# Patient Record
Sex: Female | Born: 1960
Health system: Southern US, Community
[De-identification: ages and names within clinical notes are randomized; demographics above are authoritative.]

## PROBLEM LIST (undated history)

## (undated) DIAGNOSIS — F32A Depression, unspecified: Secondary | ICD-10-CM

## (undated) DIAGNOSIS — I10 Essential (primary) hypertension: Secondary | ICD-10-CM

## (undated) DIAGNOSIS — C519 Malignant neoplasm of vulva, unspecified: Secondary | ICD-10-CM

## (undated) DIAGNOSIS — G4733 Obstructive sleep apnea (adult) (pediatric): Secondary | ICD-10-CM

## (undated) DIAGNOSIS — E119 Type 2 diabetes mellitus without complications: Secondary | ICD-10-CM

## (undated) DIAGNOSIS — J45909 Unspecified asthma, uncomplicated: Secondary | ICD-10-CM

## (undated) DIAGNOSIS — K219 Gastro-esophageal reflux disease without esophagitis: Secondary | ICD-10-CM

## (undated) DIAGNOSIS — J449 Chronic obstructive pulmonary disease, unspecified: Secondary | ICD-10-CM

## (undated) DIAGNOSIS — F419 Anxiety disorder, unspecified: Secondary | ICD-10-CM

## (undated) DIAGNOSIS — F329 Major depressive disorder, single episode, unspecified: Secondary | ICD-10-CM

## (undated) HISTORY — PX: BREAST CYST EXCISION: SHX579

## (undated) HISTORY — DX: Obstructive sleep apnea (adult) (pediatric): G47.33

## (undated) HISTORY — DX: Type 2 diabetes mellitus without complications: E11.9

## (undated) HISTORY — DX: Malignant neoplasm of vulva, unspecified: C51.9

## (undated) HISTORY — PX: CERVICAL FUSION: SHX112

## (undated) HISTORY — DX: Essential (primary) hypertension: I10

## (undated) HISTORY — PX: ABDOMINAL HYSTERECTOMY: SHX81

## (undated) HISTORY — PX: SHOULDER SURGERY: SHX246

---

## 2001-08-20 DIAGNOSIS — C519 Malignant neoplasm of vulva, unspecified: Secondary | ICD-10-CM

## 2001-08-20 HISTORY — DX: Malignant neoplasm of vulva, unspecified: C51.9

## 2001-08-20 HISTORY — PX: CO2 LASER APPLICATION: SHX5778

## 2001-11-26 ENCOUNTER — Ambulatory Visit: Admission: RE | Admit: 2001-11-26 | Discharge: 2001-11-26 | Payer: Self-pay | Admitting: Gynecology

## 2001-12-10 ENCOUNTER — Ambulatory Visit (HOSPITAL_BASED_OUTPATIENT_CLINIC_OR_DEPARTMENT_OTHER): Admission: RE | Admit: 2001-12-10 | Discharge: 2001-12-10 | Payer: Self-pay | Admitting: Gynecology

## 2001-12-30 ENCOUNTER — Ambulatory Visit: Admission: RE | Admit: 2001-12-30 | Discharge: 2001-12-30 | Payer: Self-pay | Admitting: Gynecology

## 2002-01-20 ENCOUNTER — Ambulatory Visit: Admission: RE | Admit: 2002-01-20 | Discharge: 2002-01-20 | Payer: Self-pay | Admitting: Gynecology

## 2002-03-25 ENCOUNTER — Ambulatory Visit: Admission: RE | Admit: 2002-03-25 | Discharge: 2002-03-25 | Payer: Self-pay | Admitting: Gynecology

## 2002-03-25 ENCOUNTER — Other Ambulatory Visit: Admission: RE | Admit: 2002-03-25 | Discharge: 2002-03-25 | Payer: Self-pay | Admitting: Gynecology

## 2003-07-23 ENCOUNTER — Ambulatory Visit (HOSPITAL_COMMUNITY): Admission: RE | Admit: 2003-07-23 | Discharge: 2003-07-23 | Payer: Self-pay | Admitting: Family Medicine

## 2004-04-21 ENCOUNTER — Ambulatory Visit (HOSPITAL_COMMUNITY): Admission: RE | Admit: 2004-04-21 | Discharge: 2004-04-21 | Payer: Self-pay | Admitting: Family Medicine

## 2004-07-20 ENCOUNTER — Ambulatory Visit (HOSPITAL_COMMUNITY): Admission: RE | Admit: 2004-07-20 | Discharge: 2004-07-20 | Payer: Self-pay | Admitting: Family Medicine

## 2004-11-15 ENCOUNTER — Ambulatory Visit (HOSPITAL_COMMUNITY): Admission: RE | Admit: 2004-11-15 | Discharge: 2004-11-15 | Payer: Self-pay | Admitting: Family Medicine

## 2005-04-13 ENCOUNTER — Encounter (INDEPENDENT_AMBULATORY_CARE_PROVIDER_SITE_OTHER): Payer: Self-pay | Admitting: General Surgery

## 2005-04-13 ENCOUNTER — Ambulatory Visit (HOSPITAL_COMMUNITY): Admission: RE | Admit: 2005-04-13 | Discharge: 2005-04-13 | Payer: Self-pay | Admitting: General Surgery

## 2006-05-23 ENCOUNTER — Ambulatory Visit (HOSPITAL_COMMUNITY): Admission: RE | Admit: 2006-05-23 | Discharge: 2006-05-23 | Payer: Self-pay | Admitting: Family Medicine

## 2007-09-01 ENCOUNTER — Ambulatory Visit (HOSPITAL_COMMUNITY): Admission: RE | Admit: 2007-09-01 | Discharge: 2007-09-01 | Payer: Self-pay | Admitting: Family Medicine

## 2009-01-24 ENCOUNTER — Ambulatory Visit (HOSPITAL_COMMUNITY): Admission: RE | Admit: 2009-01-24 | Discharge: 2009-01-24 | Payer: Self-pay | Admitting: Family Medicine

## 2010-04-19 ENCOUNTER — Ambulatory Visit (HOSPITAL_COMMUNITY)
Admission: RE | Admit: 2010-04-19 | Discharge: 2010-04-19 | Payer: Self-pay | Source: Home / Self Care | Admitting: Family Medicine

## 2010-09-10 ENCOUNTER — Encounter: Payer: Self-pay | Admitting: Family Medicine

## 2011-01-05 NOTE — Op Note (Signed)
[facility]  Patient:    Amy Cobb, Amy Cobb [facility] Visit Number: [id number] MRN: [id number]          Service Type: NES Location: NESC Attending Physician:  Jeannette Corpus Dictated by:   Rande Brunt. Clarke-Pearson, M.D. Proc. Date: 12/10/01 Admit Date:  12/10/2001   CC:         Telford Nab, R.N.  Ernestina Penna, M.D., [facility],  [address] [place], [place]. 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  PROCEDURE:  CO2 laser vaporization of the vulvar lesion.  SURGEON:  Daniel L. Clarke-Pearson, M.D.  ANESTHESIA:  General LMA.  ESTIMATED BLOOD LOSS:  Nil.  SURGICAL FINDINGS:  After application of the acetic acid to the vulva and anus, a focal lesion measuring approximately 4 x 4 cm was identified overlying the medial aspect of the right labia minora and medial aspect of the labia majora. There were no other lesions noted.  DESCRIPTION OF PROCEDURE:  The patient was brought to the operating room and after satisfactory attainment of general anesthesia was placed in the modified lithotomy position in [place] stirrups. The vulva was cleansed and then acetic acid applied for five minutes in order to identify the entire lesion. Using the CO2 laser and wattage powers of 15 to 20 watts, the lesion was circumscribed with approximately a 1 cm margin. The remainder of the intraepithelial neoplastic skin was oblated to the second surgical plane. Minimal bleeding was noted and controlled with a defocused laser beam. The char was wiped away using acetic acid and additional areas lasered as needed to achieve a complete oblation of the lesion down to the second surgical plane.  Silvadene cream was applied. The patient was awakened from anesthesia and taken to the recovery room in satisfactory condition. Sponge, needle  and instrument counts were correct x2. Dictated by:   Rande Brunt. Clarke-Pearson, M.D. Attending Physician:  Jeannette Corpus DD:  12/10/01 TD:  12/10/01 Job: [id number]

## 2011-01-05 NOTE — Op Note (Signed)
NAME:  Amy Cobb, Amy Cobb              ACCOUNT NO.:  [id number]   MEDICAL RECORD NO.:  [id number]          PATIENT TYPE:  AMB   LOCATION:  DAY                           FACILITY:  [facility]   PHYSICIAN:  Jerolyn Shin C. Katrinka Blazing, M.D.   DATE OF BIRTH:  [birth year]   DATE OF PROCEDURE:  04/13/2005  DATE OF DISCHARGE:                                 OPERATIVE REPORT   HISTORY:  A 50 year old female with a growing, painful, soft tissue mass of  the mid posterior neck with bilateral posterior lymphadenopathy.   PREOPERATIVE DIAGNOSIS:  Soft tissue mass of neck.   POSTOPERATIVE DIAGNOSIS:  Soft tissue mass of neck, 3.5 cm.   PROCEDURE:  Wide excision of soft tissue mass of neck, 3.5 cm.   SURGEON:  Dr. Katrinka Blazing.   DESCRIPTION:  Under general anesthesia, the patient was positioned in the  modified left lateral decubitus position. A transverse elliptical incision  was made over the mass. Incision was extended down to the fascia of the  posterior cervical muscles. It was then excised off the fascia. It was  completely excised. The deep tissue showed evidence some inflammation.  Hemostasis was achieved. The deep tissues were closed with interrupted 2-0  Monocryl. Subcutaneous tissue closed with running 2-0 Monocryl. Skin closed  with subcuticular 4-0 Vicryl. A dressing was placed. She was awakened from  anesthesia uneventfully, transferred to a bed and taken to the  postanesthetic care unit for further monitoring.      Dirk Dress. Katrinka Blazing, M.D.  Electronically Signed     LCS/MEDQ  D:  04/13/2005  T:  04/13/2005  Job:  [id number]

## 2011-01-05 NOTE — Consult Note (Signed)
   NAME:  Amy Cobb, SPELLS                        ACCOUNT NO.:  [id number]   MEDICAL RECORD NO.:  [id number]                   PATIENT TYPE:  OUT   LOCATION:  GYN                                  FACILITY:  [facility]   PHYSICIAN:  Rande Brunt. Clarke-Pearson, M.D.      DATE OF BIRTH:  [birth year]   DATE OF CONSULTATION:  DATE OF DISCHARGE:  03/25/2002                                 GYN CONSULTATION   HISTORY OF PRESENT ILLNESS:  This 50 year old African-American female  returns for follow-up, having had laser vaporization of the vulva for  carcinoma in situ on December 10, 2001.  She had an uncomplicated postoperative  course.  She reports she has had no symptoms referable to the vulva.   PAST MEDICAL HISTORY:  Status post hysterectomy.   SOCIAL HISTORY:  The patient is a guard at the prison.   REVIEW OF SYSTEMS:  Essentially negative.   PHYSICAL EXAMINATION:  VITAL SIGNS:  Weight 170 pounds.  Blood pressure  160/90.  GENERAL:  Healthy black female, in no acute distress.  HEENT:  Negative.  NECK:  Supple.  Without thyromegaly.  There is no supraclavicular or  inguinal adenopathy.  ABDOMEN:  Soft and nontender.  No mass, organomegaly, ascites, or hernias  noted.  PELVIC:  EGBUS shows the vulvar lasered area is hypopigmented.  There are no  lesions noted on the vulva or anus.  The vagina is clean, well supported.  The cervix and uterus are surgically absent.  Adnexa without masses.   IMPRESSION:  Status post laser vaporization of the vulva for carcinoma in  situ.  No evidence of recurrent disease.  The patient has had good healing.   PLAN:  Pap smears were obtained today.  We will have the patient return to  the care of her primary gynecologist, Dr. Ernestina Penna, in [place] and would  recommend that she be evaluated every six months.                                                 Daniel L. Stanford Breed, M.D.    DLC/MEDQ  D:  03/25/2002  T:  03/28/2002  Job:  [id number]   cc:   Telford Nab, R.N.   Ernestina Penna, M.D.  [facility]  [address]  [place], [place] [address]

## 2011-01-05 NOTE — H&P (Signed)
NAME:  Amy Cobb, Amy Cobb              ACCOUNT NO.:  [id number]   MEDICAL RECORD NO.:  [id number]          PATIENT TYPE:  AMB   LOCATION:  DAY                           FACILITY:  [facility]   PHYSICIAN:  Jerolyn Shin C. Katrinka Blazing, M.D.   DATE OF BIRTH:  17-Nov-1960   DATE OF ADMISSION:  DATE OF DISCHARGE:  LH                                HISTORY & PHYSICAL   A 50 year old female with a history of a mass at the base of her scalp for  at least a year.  The mass has increased in size.  She has had bilateral  posterior cervical adenopathy.  She is scheduled to have the mass excised  under anesthesia.   PAST MEDICAL HISTORY:  Positive for hypertension and asthma.   PAST SURGICAL HISTORY:  Right breast biopsy and hysterectomy.   MEDICATIONS:  1.  Albuterol inhaler twice daily.  2.  Singulair 10 mg daily.  3.  Diltiazem extended release 240 mg daily.  4.  Aspirin 81 mg daily.  5.  Metoprolol 100 mg twice daily.  6.  Diovan/HCT 160/12.5 mg daily.   ALLERGIES:  AUGMENTIN.   SOCIAL HISTORY:  She is single.  She is employed as a Press photographer.  She drinks an occasional alcoholic beverage.  She smokes about six  cigarettes per day.   PHYSICAL EXAMINATION:  VITAL SIGNS:  Blood pressure 106/96, pulse 72,  respirations 20.  Weight 163 pounds.  HEENT:  Unremarkable.  NECK:  There is a 3 cm mass in the mid portion of the posterior neck at the  lower hairline in the midline.  There are small, nontender, mobile bilateral  posterior cervical nodes.  There are no anterior cervical nodes.  Neck is  otherwise unremarkable.  LUNGS:  Clear to auscultation.  HEART:  Regular rate and rhythm without murmur, gallop or rub.  ABDOMEN:  Soft and nontender with no masses.  EXTREMITIES:  No clubbing, cyanosis or edema.  NEUROLOGIC:  No focal motor, sensory, or cerebellar deficits.   IMPRESSION:  1.  Soft tissue mass, posterior neck, 3 to 3.5 cm.  2.  Hypertension.  3.  History of asthma.   PLAN:  Excision  of mass under anesthesia.      Dirk Dress. Katrinka Blazing, M.D.  Electronically Signed     LCS/MEDQ  D:  04/12/2005  T:  04/12/2005  Job:  [id number]   cc:   [facility]  Fax: [phone]   Corrie Mckusick, M.D.  Fax: [phone]

## 2011-01-05 NOTE — Consult Note (Signed)
[facility]  Patient:    Amy Cobb, Amy Cobb [facility] Visit Number: [id number] MRN: [id number]          Service Type: GON Location: GYN Attending Physician:  Jeannette Corpus Dictated by:   Rande Brunt. Clarke-Pearson, M.D. Consultation Date: 01/20/02 Admit Date:  01/20/2002 Discharge Date: 01/20/2002   CC:         Dagmar Hait, R.N. N                          Consultation Report  GYNECOLOGY ONCOLOGY PROGRAM  HISTORY:                      The patient returns today for further follow-up, having had laser vaporization of the vulva for car in situ on December 10, 2001. She has had good healing.  She reports that she is planning on returning to work.  She denies any vaginal bleeding or discharge, or any significant pain.  PHYSICAL EXAMINATION:  VITAL SIGNS:                  Weight 163 pounds.  Blood pressure 158/105.  ABDOMEN:                      Soft, nontender.  No masses, hepatomegaly, hernia noted.  PELVIC:                       EGBUS shows that the lasered area on the right vulva is completely healed.  The labia minora are flattened on this side.  At the upper aspect of the laser area is an area of hyperpigmentation.  It is difficult to determine whether this is normal pigmented skin in an [place] amongst the lasered skin or whether this is a new lesion.  PLAN:                         We will have the patient return to see me in two months, especially to monitor this area of concern.  If it becomes at all suspicious we will consider local excision in the office. Dictated by:   Rande Brunt. Clarke-Pearson, M.D. Attending Physician:  Jeannette Corpus DD:  01/20/02 TD:  01/22/02 Job: [id number]

## 2011-01-05 NOTE — Consult Note (Signed)
[facility]  Patient:    Amy Cobb, Amy Cobb [facility] Visit Number: [id number] MRN: [id number]          Service Type: GON Location: GYN Attending Physician:  Jeannette Corpus Dictated by:   Rande Brunt. Clarke-Pearson, M.D. Proc. Date: 11/26/01 Admit Date:  11/26/2001   CC:         Dr. Ernestina Penna, [facility] [address] [place], [place] [address]             Telford Nab, R.N.   Consultation Report  REASON FOR CONSULTATION:  A 50 year old African-American female seen in consultation at the request of Dr. Ernestina Penna of [place].  The patient noted several months of perineal itching and thickening and was subsequently evaluated by Dr. Ralph Dowdy, who obtained a vulvar biopsy showing squamous cell carcinoma in situ.  The patient reports that she has had no past history of abnormal Pap smears, HPV infections, warts.  She is not sexually active at the present time.  The vulvar lesion is only somewhat pruritic, and there is no bleeding or drainage from this area.  PAST MEDICAL HISTORY:  Medical illnesses:  Asthma.  PAST SURGICAL HISTORY:  Total abdominal hysterectomy for fibroids, breast biopsy.  DRUG ALLERGIES:  AUGMENTIN.  OBSTETRICAL HISTORY:  Gravida 0.  SOCIAL HISTORY:  The patient is a Public relations account executive at the [employer] but is moved down here from [place] approximately four years ago.  REVIEW OF SYSTEMS:  Otherwise negative.  PHYSICAL EXAMINATION:  VITAL SIGNS:  Weight 166 pounds, height 5 feet 6 inches, blood pressure 140/90, pulse 80, respiratory rate 18.  GENERAL:  The patient is a healthy African-American female in no acute distress.  HEENT:  Negative.  NECK:  Supple without thyromegaly.  LYMPHATIC:  There is no supraclavicular or inguinal adenopathy.  ABDOMEN:  Soft, nontender, no mass, organomegaly, ascites, or hernias noted. Her scar is well-healed.  PELVIC:  EG/BUS shows quite obviously a vulvar lesion on the right  vulva measuring approximately 4 cm in diameter.  This is thickened with white epithelium and some erythema in the center portion.  Acetic acid is applied to the entire vulva for five minutes, and the vulva and anus are reinspected.  I did not see any other lesions.  The vagina is narrow and stenotic and does not admit a narrow speculum.  The patient is very uncomfortable, and therefore the remainder of the pelvic exam is deferred.  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  I had a lengthy discussion with the patient and her companion regarding management options, which would include primary excision with primary closure, excision with split-thickness skin graft, or laser vaporization.  We discussed the pros and cons of each of these.  At the conclusion of that discussion, the patient wishes to proceed with a CO2 laser vaporization of the vulva.  She is aware of the risks involved and the need for sitz baths, Silvadene cream postop.  We will schedule surgery for the near future. Dictated by:   Rande Brunt. Clarke-Pearson, M.D. Attending Physician:  Jeannette Corpus DD:  11/26/01 TD:  11/27/01 Job: [id number]

## 2011-01-05 NOTE — Consult Note (Signed)
[facility]  Patient:    Amy Cobb, Amy Cobb [facility] Visit Number: [id number] MRN: [id number]          Service Type: GON Location: GYN Attending Physician:  Jeannette Corpus Dictated by:   Rande Brunt. Clarke-Pearson, M.D. Admit Date:  12/30/2001 Discharge Date: 12/30/2001   CC:         Telford Nab, R.N.   Consultation Report  HISTORY OF PRESENT ILLNESS:  This is a 50 year old African-American female who returns for her postoperative check now 3 weeks following C02 laser vaporization of a right vulvar lesion. She tolerated the procedure well. She is using sitz baths and Silvadene Cream and seems to be doing reasonably well, although she does have some burning on the ulcerated area when she urinates. She has no other constitutional symptoms.  PHYSICAL EXAMINATION:  VITAL SIGNS:  Blood pressure 164/109. Weight 155 pounds.  GENERAL:  The patient is a pleasant, healthy, black female in no acute distress.  HEENT:  Negative.  NECK:  Supple without thyromegaly.  ABDOMEN:  Soft, nontender.  No masses, splenomegaly, ascites or hernias noted.  PELVIC:  The right vulvar lesion has been totally ablated and is now granulated nicely with some early epithelialization at the periphery. There is no evidence of infection, although it is tender to the touch.  IMPRESSION:  The patient is doing well. She will continue on her regimen and return to see me in 3 weeks for reevaluation. Dictated by:   Rande Brunt. Clarke-Pearson, M.D. Attending Physician:  Jeannette Corpus DD:  12/30/01 TD:  01/02/02 Job: [id number]

## 2012-05-07 DIAGNOSIS — R0602 Shortness of breath: Secondary | ICD-10-CM

## 2012-10-08 ENCOUNTER — Ambulatory Visit (HOSPITAL_COMMUNITY)
Admission: RE | Admit: 2012-10-08 | Discharge: 2012-10-08 | Disposition: A | Discharge status: Home / Self Care | Payer: Self-pay | Source: Ambulatory Visit | Attending: Family Medicine | Admitting: Family Medicine

## 2012-10-08 ENCOUNTER — Other Ambulatory Visit (HOSPITAL_COMMUNITY): Payer: Self-pay | Admitting: Family Medicine

## 2012-10-08 DIAGNOSIS — R0602 Shortness of breath: Secondary | ICD-10-CM | POA: Insufficient documentation

## 2012-10-08 DIAGNOSIS — J441 Chronic obstructive pulmonary disease with (acute) exacerbation: Secondary | ICD-10-CM | POA: Insufficient documentation

## 2012-12-17 ENCOUNTER — Emergency Department (HOSPITAL_COMMUNITY)
Admission: EM | Admit: 2012-12-17 | Discharge: 2012-12-17 | Disposition: A | Discharge status: Home / Self Care | Payer: Self-pay | Attending: Emergency Medicine | Admitting: Emergency Medicine

## 2012-12-17 ENCOUNTER — Emergency Department (HOSPITAL_COMMUNITY): Payer: Self-pay

## 2012-12-17 ENCOUNTER — Encounter (HOSPITAL_COMMUNITY): Payer: Self-pay | Admitting: Emergency Medicine

## 2012-12-17 DIAGNOSIS — F329 Major depressive disorder, single episode, unspecified: Secondary | ICD-10-CM | POA: Insufficient documentation

## 2012-12-17 DIAGNOSIS — E119 Type 2 diabetes mellitus without complications: Secondary | ICD-10-CM | POA: Insufficient documentation

## 2012-12-17 DIAGNOSIS — R51 Headache: Secondary | ICD-10-CM | POA: Insufficient documentation

## 2012-12-17 DIAGNOSIS — F3289 Other specified depressive episodes: Secondary | ICD-10-CM | POA: Insufficient documentation

## 2012-12-17 DIAGNOSIS — R42 Dizziness and giddiness: Secondary | ICD-10-CM | POA: Insufficient documentation

## 2012-12-17 DIAGNOSIS — I10 Essential (primary) hypertension: Secondary | ICD-10-CM | POA: Insufficient documentation

## 2012-12-17 DIAGNOSIS — Z87891 Personal history of nicotine dependence: Secondary | ICD-10-CM | POA: Insufficient documentation

## 2012-12-17 DIAGNOSIS — Z79899 Other long term (current) drug therapy: Secondary | ICD-10-CM | POA: Insufficient documentation

## 2012-12-17 DIAGNOSIS — J441 Chronic obstructive pulmonary disease with (acute) exacerbation: Secondary | ICD-10-CM | POA: Insufficient documentation

## 2012-12-17 DIAGNOSIS — J302 Other seasonal allergic rhinitis: Secondary | ICD-10-CM

## 2012-12-17 DIAGNOSIS — Z981 Arthrodesis status: Secondary | ICD-10-CM | POA: Insufficient documentation

## 2012-12-17 DIAGNOSIS — J309 Allergic rhinitis, unspecified: Secondary | ICD-10-CM | POA: Insufficient documentation

## 2012-12-17 DIAGNOSIS — R110 Nausea: Secondary | ICD-10-CM | POA: Insufficient documentation

## 2012-12-17 DIAGNOSIS — Z880 Allergy status to penicillin: Secondary | ICD-10-CM | POA: Insufficient documentation

## 2012-12-17 DIAGNOSIS — J40 Bronchitis, not specified as acute or chronic: Secondary | ICD-10-CM

## 2012-12-17 HISTORY — DX: Depression, unspecified: F32.A

## 2012-12-17 HISTORY — DX: Major depressive disorder, single episode, unspecified: F32.9

## 2012-12-17 HISTORY — DX: Unspecified asthma, uncomplicated: J45.909

## 2012-12-17 HISTORY — DX: Chronic obstructive pulmonary disease, unspecified: J44.9

## 2012-12-17 LAB — TROPONIN I: Troponin I: <0.3 ng/mL (ref <0.30)

## 2012-12-17 LAB — CBC WITH DIFFERENTIAL/PLATELET
Basophils Absolute: 0.1 10*3/uL (ref 0.0–0.1)
Basophils Relative: 1 % (ref 0–1)
Eosinophils Absolute: 0.1 10*3/uL (ref 0.0–0.7)
Eosinophils Relative: 1 % (ref 0–5)
HCT: 42.8 % (ref 36.0–46.0)
Hemoglobin: 15.3 g/dL — ABNORMAL HIGH (ref 12.0–15.0)
Lymphocytes Relative: 30 % (ref 12–46)
Lymphs Abs: 2.2 10*3/uL (ref 0.7–4.0)
MCH: 28 pg (ref 26.0–34.0)
MCHC: 35.7 g/dL (ref 30.0–36.0)
MCV: 78.4 fL (ref 78.0–100.0)
Monocytes Absolute: 0.5 10*3/uL (ref 0.1–1.0)
Monocytes Relative: 6 % (ref 3–12)
Neutro Abs: 4.7 10*3/uL (ref 1.7–7.7)
Neutrophils Relative %: 62 % (ref 43–77)
Platelets: 253 10*3/uL (ref 150–400)
RBC: 5.46 MIL/uL — ABNORMAL HIGH (ref 3.87–5.11)
RDW: 14.5 % (ref 11.5–15.5)
WBC: 7.5 10*3/uL (ref 4.0–10.5)

## 2012-12-17 LAB — URINALYSIS, ROUTINE W REFLEX MICROSCOPIC
Bilirubin Urine: NEGATIVE
Glucose, UA: >1000 mg/dL — AB
Hgb urine dipstick: NEGATIVE
Ketones, ur: NEGATIVE mg/dL
Leukocytes, UA: NEGATIVE
Nitrite: NEGATIVE
Protein, ur: NEGATIVE mg/dL
Specific Gravity, Urine: 1.01 (ref 1.005–1.030)
Urobilinogen, UA: 0.2 mg/dL (ref 0.0–1.0)
pH: 6.5 (ref 5.0–8.0)

## 2012-12-17 LAB — COMPREHENSIVE METABOLIC PANEL
ALT: 14 U/L (ref 0–35)
AST: 14 U/L (ref 0–37)
Albumin: 3.9 g/dL (ref 3.5–5.2)
Alkaline Phosphatase: 85 U/L (ref 39–117)
BUN: 9 mg/dL (ref 6–23)
CO2: 25 mEq/L (ref 19–32)
Calcium: 10 mg/dL (ref 8.4–10.5)
Chloride: 93 mEq/L — ABNORMAL LOW (ref 96–112)
Creatinine, Ser: 0.49 mg/dL — ABNORMAL LOW (ref 0.50–1.10)
GFR calc Af Amer: >90 mL/min (ref >90)
GFR calc non Af Amer: >90 mL/min (ref >90)
Glucose, Bld: 349 mg/dL — ABNORMAL HIGH (ref 70–99)
Potassium: 3.9 mEq/L (ref 3.5–5.1)
Sodium: 130 mEq/L — ABNORMAL LOW (ref 135–145)
Total Bilirubin: 0.4 mg/dL (ref 0.3–1.2)
Total Protein: 8 g/dL (ref 6.0–8.3)

## 2012-12-17 LAB — GLUCOSE, CAPILLARY: Glucose-Capillary: 352 mg/dL — ABNORMAL HIGH (ref 70–99)

## 2012-12-17 MED ORDER — CLONIDINE HCL 0.1 MG PO TABS: ORAL_TABLET | ORAL | Status: DC

## 2012-12-17 MED ORDER — LABETALOL HCL 5 MG/ML IV SOLN
10.0000 mg | Freq: Once | INTRAVENOUS | Status: AC
  Administered 2012-12-17: 10 mg via INTRAVENOUS
  Filled 2012-12-17: qty 4

## 2012-12-17 MED ORDER — ONDANSETRON HCL 4 MG PO TABS: 4.0000 mg | ORAL_TABLET | Freq: Four times a day (QID) | ORAL | Status: DC | PRN

## 2012-12-17 MED ORDER — MECLIZINE HCL 12.5 MG PO TABS
25.0000 mg | ORAL_TABLET | Freq: Once | ORAL | Status: AC
  Administered 2012-12-17: 25 mg via ORAL
  Filled 2012-12-17: qty 2

## 2012-12-17 MED ORDER — MECLIZINE HCL 25 MG PO TABS: ORAL_TABLET | ORAL | Status: DC

## 2012-12-17 MED ORDER — AZITHROMYCIN 250 MG PO TABS: ORAL_TABLET | ORAL | Status: DC

## 2012-12-17 MED ORDER — DIPHENHYDRAMINE HCL 50 MG/ML IJ SOLN
25.0000 mg | Freq: Once | INTRAMUSCULAR | Status: AC
  Administered 2012-12-17: 25 mg via INTRAVENOUS
  Filled 2012-12-17: qty 1

## 2012-12-17 MED ORDER — AEROCHAMBER Z-STAT PLUS/MEDIUM MISC
1.0000 | Freq: Once | Status: DC
  Filled 2012-12-17: qty 1

## 2012-12-17 MED ORDER — ALBUTEROL SULFATE HFA 108 (90 BASE) MCG/ACT IN AERS
2.0000 | INHALATION_SPRAY | RESPIRATORY_TRACT | Status: DC | PRN
  Administered 2012-12-17: 2 via RESPIRATORY_TRACT
  Filled 2012-12-17: qty 6.7

## 2012-12-17 MED ORDER — METOCLOPRAMIDE HCL 5 MG/ML IJ SOLN
10.0000 mg | Freq: Once | INTRAMUSCULAR | Status: AC
  Administered 2012-12-17: 10 mg via INTRAVENOUS
  Filled 2012-12-17: qty 2

## 2012-12-17 MED ORDER — LORAZEPAM 1 MG PO TABS
1.0000 mg | ORAL_TABLET | Freq: Once | ORAL | Status: AC
  Administered 2012-12-17: 1 mg via ORAL
  Filled 2012-12-17: qty 1

## 2012-12-17 MED ORDER — KETOROLAC TROMETHAMINE 30 MG/ML IJ SOLN
30.0000 mg | Freq: Once | INTRAMUSCULAR | Status: AC
  Administered 2012-12-17: 30 mg via INTRAVENOUS
  Filled 2012-12-17: qty 1

## 2012-12-17 NOTE — ED Notes (Signed)
Pt given water 

## 2012-12-17 NOTE — ED Notes (Signed)
Patient with no complaints at this time. Respirations even and unlabored. Skin warm/dry. Discharge instructions reviewed with patient at this time. Patient given opportunity to voice concerns/ask questions. IV removed per policy and band-aid applied to site. Patient discharged at this time and left Emergency Department via wheelchair.  

## 2012-12-17 NOTE — ED Notes (Signed)
Pt gone to MRI 

## 2012-12-17 NOTE — ED Provider Notes (Signed)
History     CSN: [id number]  Arrival date & time 12/17/12  1245   First MD Initiated Contact with Patient 12/17/12 1255      Chief Complaint  Patient presents with  . Fatigue  . Shortness of Breath  . Asthma    (Consider location/radiation/quality/duration/timing/severity/associated sxs/prior treatment) HPI  Patient reports she has been having flareup of her COPD and her allergies past few weeks. She states this morning she bent over to tie her shoes and she had acute feeling of feeling like she was spinning. She states this lasted about 10 minutes and she had nausea without vomiting. She states she had a right-sided headache and still felt disoriented or confused afterwards and states it felt like when her blood sugar is low. She also reported feeling unsteady and off balance. At about 1220 she was driving her vehicle for a few minutes. She states she was looking straight ahead but reaching to turn on her radio and she again had acute onset of feeling like she was spinning and felt like she was going to pass out. She states this time she felt like her heart was racing and she had a pressure in her head. She denies numbness or weakness in her arms, legs, or face. She states she did have an abnormal feeling her right jaw with normal speech earlier this morning. She states she has a chronic cough that is worse. She has some shortness of breath without wheezing. She's had lack of energy for the past few days and also loss of appetite. She states when she uses her nebulizer it only helps temporarily. She states she's never had the dizziness like this before.  PCP Dr Phillips Odor  Past Medical History  Diagnosis Date  . COPD (chronic obstructive pulmonary disease)   . Hypertension   . Asthma   . Diabetes mellitus without complication   . Depression     Past Surgical History  Procedure Laterality Date  . Abdominal hysterectomy    . Shoulder surgery    . Cervical fusion      History  reviewed. No pertinent family history.  History  Substance Use Topics  . Smoking status: Former Games developer  . Smokeless tobacco: Former Neurosurgeon    Quit date: 12/17/2009  . Alcohol Use: Yes     Comment: occ  umemployed, applying for disability.   OB History   Grav Para Term Preterm Abortions TAB SAB Ect Mult Living                  Review of Systems  All other systems reviewed and are negative.    Allergies  Augmentin and Penicillins  Home Medications   Current Outpatient Rx  Name  Route  Sig  Dispense  Refill  . albuterol (PROVENTIL HFA;VENTOLIN HFA) 108 (90 BASE) MCG/ACT inhaler   Inhalation   Inhale 2 puffs into the lungs every 6 (six) hours as needed for wheezing or shortness of breath.         Marland Kitchen albuterol (PROVENTIL) (2.5 MG/3ML) 0.083% nebulizer solution   Nebulization   Take 2.5 mg by nebulization every 6 (six) hours as needed for wheezing or shortness of breath.         . budesonide-formoterol (SYMBICORT) 160-4.5 MCG/ACT inhaler   Inhalation   Inhale 2 puffs into the lungs 2 (two) times daily.         . cloNIDine (CATAPRES) 0.1 MG tablet   Oral   Take 0.1 mg by mouth TID.         Marland Kitchen  diltiazem (TIAZAC) 240 MG 24 hr capsule   Oral   Take 240 mg by mouth daily.         Marland Kitchen FLUoxetine (PROZAC) 20 MG capsule   Oral   Take 20 mg by mouth daily.         . metoprolol (LOPRESSOR) 100 MG tablet   Oral   Take 100 mg by mouth 2 (two) times daily.         . montelukast (SINGULAIR) 10 MG tablet   Oral   Take 10 mg by mouth at bedtime.         . sitaGLIPtan-metformin (JANUMET) 50-1000 MG per tablet   Oral   Take 1 tablet by mouth 2 (two) times daily with a meal.         . tiotropium (SPIRIVA) 18 MCG inhalation capsule   Inhalation   Place 18 mcg into inhaler and inhale daily.         . traZODone (DESYREL) 100 MG tablet   Oral   Take 100 mg by mouth at bedtime.         ]  ED Triage Vitals  Enc Vitals Group     BP 12/17/12 1251 229/108  mmHg     Pulse Rate 12/17/12 1251 90     Resp 12/17/12 1251 20     Temp 12/17/12 1251 97.8 F (36.6 C)     Temp src 12/17/12 1251 Oral     SpO2 12/17/12 1251 100 %     Weight 12/17/12 1251 165 lb (74.844 kg)     Height 12/17/12 1251 5' 7.5" (1.715 m)     Head Cir --      Peak Flow --      Pain Score 12/17/12 1254 Seven     Pain Loc --      Pain Edu? --      Excl. in GC? --      Vital signs normal except for hypertension   Physical Exam  Nursing note and vitals reviewed. Constitutional: She is oriented to person, place, and time. She appears well-developed and well-nourished.  Non-toxic appearance. She does not appear ill. No distress.  HENT:  Head: Normocephalic and atraumatic.  Right Ear: External ear normal.  Left Ear: External ear normal.  Nose: Nose normal. No mucosal edema or rhinorrhea.  Mouth/Throat: Oropharynx is clear and moist and mucous membranes are normal. No dental abscesses or edematous.  Eyes: Conjunctivae and EOM are normal. Pupils are equal, round, and reactive to light.  No nystagymus  Neck: Normal range of motion and full passive range of motion without pain. Neck supple.  Cardiovascular: Normal rate, regular rhythm and normal heart sounds.  Exam reveals no gallop and no friction rub.   No murmur heard. Pulmonary/Chest: Effort normal and breath sounds normal. No respiratory distress. She has no wheezes. She has no rhonchi. She has no rales. She exhibits no tenderness and no crepitus.  Abdominal: Soft. Normal appearance and bowel sounds are normal. She exhibits no distension. There is no tenderness. There is no rebound and no guarding.  Musculoskeletal: Normal range of motion. She exhibits no edema and no tenderness.  Moves all extremities well.   Neurological: She is alert and oriented to person, place, and time. She has normal strength. No cranial nerve deficit.  Skin: Skin is warm, dry and intact. No rash noted. No erythema. No pallor.  Psychiatric: Her  speech is normal and behavior is normal. Her mood appears not anxious.  Flat affect    ED Course  Procedures (including critical care time)  Medications  albuterol (PROVENTIL HFA;VENTOLIN HFA) 108 (90 BASE) MCG/ACT inhaler 2 puff (not administered)  aerochamber Z-Stat Plus/medium 1 each (not administered)  meclizine (ANTIVERT) tablet 25 mg (25 mg Oral Given 12/17/12 1347)  metoCLOPramide (REGLAN) injection 10 mg (10 mg Intravenous Given 12/17/12 1347)  diphenhydrAMINE (BENADRYL) injection 25 mg (25 mg Intravenous Given 12/17/12 1346)  labetalol (NORMODYNE,TRANDATE) injection 10 mg (10 mg Intravenous Given 12/17/12 1541)  meclizine (ANTIVERT) tablet 25 mg (25 mg Oral Given 12/17/12 1539)  LORazepam (ATIVAN) tablet 1 mg (1 mg Oral Given 12/17/12 1539)  ketorolac (TORADOL) 30 MG/ML injection 30 mg (30 mg Intravenous Given 12/17/12 1747)   Recheck at 1500 blood pressures were noted. She was given labetalol. She states she still has some mild dizziness. She was given an additional dose of Antivert. At this point I felt we should do an MR of her brain to make sure she was not having a stroke.  At time of discharge her dizziness is mild, her BP is 169/92 with pulse of 86 after getting labetolol. I would expect her pulse to be lower with the amount of metoprolol she is taking. She states she took it today but not her clonidine because she was going to drive and didn't want to get sleepy. We discuss skipping her clonidine which can make her BP paradoxically get very high. She is going to monitor her BP at home.   Results for orders placed during the hospital encounter of 12/17/12  GLUCOSE, CAPILLARY      Result Value Range   Glucose-Capillary 352 (*) 70 - 99 mg/dL  CBC WITH DIFFERENTIAL      Result Value Range   WBC 7.5  4.0 - 10.5 K/uL   RBC 5.46 (*) 3.87 - 5.11 MIL/uL   Hemoglobin 15.3 (*) 12.0 - 15.0 g/dL   HCT 14.7  82.9 - 56.2 %   MCV 78.4  78.0 - 100.0 fL   MCH 28.0  26.0 - 34.0 pg   MCHC  35.7  30.0 - 36.0 g/dL   RDW 13.0  86.5 - 78.4 %   Platelets 253  150 - 400 K/uL   Neutrophils Relative 62  43 - 77 %   Neutro Abs 4.7  1.7 - 7.7 K/uL   Lymphocytes Relative 30  12 - 46 %   Lymphs Abs 2.2  0.7 - 4.0 K/uL   Monocytes Relative 6  3 - 12 %   Monocytes Absolute 0.5  0.1 - 1.0 K/uL   Eosinophils Relative 1  0 - 5 %   Eosinophils Absolute 0.1  0.0 - 0.7 K/uL   Basophils Relative 1  0 - 1 %   Basophils Absolute 0.1  0.0 - 0.1 K/uL  COMPREHENSIVE METABOLIC PANEL      Result Value Range   Sodium 130 (*) 135 - 145 mEq/L   Potassium 3.9  3.5 - 5.1 mEq/L   Chloride 93 (*) 96 - 112 mEq/L   CO2 25  19 - 32 mEq/L   Glucose, Bld 349 (*) 70 - 99 mg/dL   BUN 9  6 - 23 mg/dL   Creatinine, Ser 6.96 (*) 0.50 - 1.10 mg/dL   Calcium 29.5  8.4 - 28.4 mg/dL   Total Protein 8.0  6.0 - 8.3 g/dL   Albumin 3.9  3.5 - 5.2 g/dL   AST 14  0 - 37 U/L   ALT 14  0 - 35 U/L   Alkaline Phosphatase 85  39 - 117 U/L   Total Bilirubin 0.4  0.3 - 1.2 mg/dL   GFR calc non Af Amer >90  >90 mL/min   GFR calc Af Amer >90  >90 mL/min  TROPONIN I      Result Value Range   Troponin I <0.30  <0.30 ng/mL  URINALYSIS, ROUTINE W REFLEX MICROSCOPIC      Result Value Range   Color, Urine YELLOW  YELLOW   APPearance CLEAR  CLEAR   Specific Gravity, Urine 1.010  1.005 - 1.030   pH 6.5  5.0 - 8.0   Glucose, UA >1000 (*) NEGATIVE mg/dL   Hgb urine dipstick NEGATIVE  NEGATIVE   Bilirubin Urine NEGATIVE  NEGATIVE   Ketones, ur NEGATIVE  NEGATIVE mg/dL   Protein, ur NEGATIVE  NEGATIVE mg/dL   Urobilinogen, UA 0.2  0.0 - 1.0 mg/dL   Nitrite NEGATIVE  NEGATIVE   Leukocytes, UA NEGATIVE  NEGATIVE  URINE MICROSCOPIC-ADD ON      Result Value Range   Squamous Epithelial / LPF RARE  RARE   WBC, UA 0-2  <3 WBC/hpf   Laboratory interpretation all normal except for hyperglycemia, glucosuria, concentrated hemoglobin consistent with dehydration, hyponatremia and low chloride consistent with dehydration   Dg Chest 2  View  12/17/2012  *RADIOLOGY REPORT*  Clinical Data: Shortness of breath, dizziness, fatigue.  History of asthma.  CHEST - 2 VIEW  Comparison: 10/08/2012  Findings: Heart and mediastinal contours are within normal limits. No focal opacities or effusions.  No acute bony abnormality.  IMPRESSION: No active cardiopulmonary disease.   Original Report Authenticated By: Charlett Nose, M.D.    Mr Brain Wo Contrast  12/17/2012  *RADIOLOGY REPORT*  Clinical Data: 52 year old female with headache, dizziness, hypertension, facial pressure.  Reportedly, systolic blood pressure over 200.  MRI HEAD WITHOUT CONTRAST  Technique:  Multiplanar, multiecho pulse sequences of the brain and surrounding structures were obtained according to standard protocol without intravenous contrast.  Comparison: [facility] CT without contrast 05/07/2012.  Findings: Normal cerebral volume. No restricted diffusion to suggest acute infarction.  No midline shift, mass effect, evidence of mass lesion, ventriculomegaly, extra-axial collection or acute intracranial hemorrhage.  Cervicomedullary junction and pituitary are within normal limits.  Major intracranial vascular flow voids are preserved.  Mild for age scattered mostly subcortical small foci of cerebral white matter T2 and FLAIR hyperintensity.  Configuration is nonspecific.  Signal changes are slightly worse in the right hemisphere.  No cortical encephalomalacia.  Otherwise normal gray- white matter signal.  Negative visualized cervical spine.  Normal bone marrow signal.  Mild paranasal sinus mucosal thickening.  Mastoids are clear and visible internal auditory structures are grossly normal. Visualized orbit soft tissues are within normal limits.  Negative scalp soft tissues.  Tiny right frontal convexity scalp lipoma.  IMPRESSION: 1.  No acute intracranial abnormality. 2.  Mild for age nonspecific cerebral white matter signal changes. 3.  Mild paranasal sinus mucosal thickening.    Original Report Authenticated By: Erskine Speed, M.D.     Date: 12/17/2012  Rate: 77  Rhythm: normal sinus rhythm  QRS Axis: normal  Intervals: normal  ST/T Wave abnormalities: normal  Conduction Disutrbances:none  Narrative Interpretation:   Old EKG Reviewed: none available    1. Vertigo   2. Bronchitis   3. Seasonal allergies       MDM patient presents with  hypertension in the 229/122 range. She has dizziness  which at first was not clear whether it was vertigo or from her hypertension. Patient did not take all her for her blood pressure medicine today, specifically her clonidine which can cause a paradoxical hypertension if it is cold turkeyed. Patient did respond to meclizine with some disc mild dizziness still present at time of discharge. Her blood pressure improved with labetalol. We discussed her monitoring her blood pressure closely the next couple days. She was given a when necessary order of clonidine to use to bring it down at home if her blood pressure gets over 190 systolic. Her cough that she's had for the past few weeks was treated with antibiotics. I did not start her on steroids since her baseline glucose was in the mid 300s.           Ward Givens, MD 12/17/12 [phone]

## 2012-12-17 NOTE — ED Notes (Signed)
Pt here for increased weakness and SOB due to COPD. Pt states she was getting dressed this am and about passed out. Pt states then was driving and felt faint. Pt here for weakness.

## 2013-02-24 ENCOUNTER — Other Ambulatory Visit (HOSPITAL_COMMUNITY): Payer: Self-pay | Admitting: Nurse Practitioner

## 2013-02-24 DIAGNOSIS — Z139 Encounter for screening, unspecified: Secondary | ICD-10-CM

## 2013-02-27 ENCOUNTER — Ambulatory Visit (HOSPITAL_COMMUNITY)
Admission: RE | Admit: 2013-02-27 | Discharge: 2013-02-27 | Disposition: A | Discharge status: Home / Self Care | Payer: Self-pay | Source: Ambulatory Visit | Attending: Nurse Practitioner | Admitting: Nurse Practitioner

## 2013-02-27 DIAGNOSIS — Z139 Encounter for screening, unspecified: Secondary | ICD-10-CM

## 2013-07-19 ENCOUNTER — Emergency Department (HOSPITAL_COMMUNITY): Payer: Self-pay

## 2013-07-19 ENCOUNTER — Encounter (HOSPITAL_COMMUNITY): Payer: Self-pay | Admitting: Emergency Medicine

## 2013-07-19 ENCOUNTER — Emergency Department (HOSPITAL_COMMUNITY)
Admission: EM | Admit: 2013-07-19 | Discharge: 2013-07-19 | Disposition: A | Discharge status: Home / Self Care | Payer: Self-pay | Attending: Emergency Medicine | Admitting: Emergency Medicine

## 2013-07-19 DIAGNOSIS — Z87891 Personal history of nicotine dependence: Secondary | ICD-10-CM | POA: Insufficient documentation

## 2013-07-19 DIAGNOSIS — H9209 Otalgia, unspecified ear: Secondary | ICD-10-CM | POA: Insufficient documentation

## 2013-07-19 DIAGNOSIS — Z79899 Other long term (current) drug therapy: Secondary | ICD-10-CM | POA: Insufficient documentation

## 2013-07-19 DIAGNOSIS — J029 Acute pharyngitis, unspecified: Secondary | ICD-10-CM | POA: Insufficient documentation

## 2013-07-19 DIAGNOSIS — I10 Essential (primary) hypertension: Secondary | ICD-10-CM | POA: Insufficient documentation

## 2013-07-19 DIAGNOSIS — F3289 Other specified depressive episodes: Secondary | ICD-10-CM | POA: Insufficient documentation

## 2013-07-19 DIAGNOSIS — IMO0002 Reserved for concepts with insufficient information to code with codable children: Secondary | ICD-10-CM | POA: Insufficient documentation

## 2013-07-19 DIAGNOSIS — E119 Type 2 diabetes mellitus without complications: Secondary | ICD-10-CM | POA: Insufficient documentation

## 2013-07-19 DIAGNOSIS — Z880 Allergy status to penicillin: Secondary | ICD-10-CM | POA: Insufficient documentation

## 2013-07-19 DIAGNOSIS — F329 Major depressive disorder, single episode, unspecified: Secondary | ICD-10-CM | POA: Insufficient documentation

## 2013-07-19 DIAGNOSIS — J441 Chronic obstructive pulmonary disease with (acute) exacerbation: Secondary | ICD-10-CM | POA: Insufficient documentation

## 2013-07-19 MED ORDER — PREDNISONE 10 MG PO TABS: 20.0000 mg | ORAL_TABLET | Freq: Two times a day (BID) | ORAL | Status: DC

## 2013-07-19 MED ORDER — ALBUTEROL SULFATE (5 MG/ML) 0.5% IN NEBU
5.0000 mg | INHALATION_SOLUTION | Freq: Once | RESPIRATORY_TRACT | Status: AC
  Administered 2013-07-19: 5 mg via RESPIRATORY_TRACT
  Filled 2013-07-19: qty 1

## 2013-07-19 MED ORDER — GUAIFENESIN-CODEINE 100-10 MG/5ML PO SOLN: 10.0000 mL | Freq: Four times a day (QID) | ORAL | Status: DC | PRN

## 2013-07-19 MED ORDER — AZITHROMYCIN 250 MG PO TABS: ORAL_TABLET | ORAL | Status: DC

## 2013-07-19 MED ORDER — IPRATROPIUM BROMIDE 0.02 % IN SOLN
0.5000 mg | Freq: Once | RESPIRATORY_TRACT | Status: AC
  Administered 2013-07-19: 0.5 mg via RESPIRATORY_TRACT
  Filled 2013-07-19: qty 2.5

## 2013-07-19 MED ORDER — PREDNISONE 20 MG PO TABS
40.0000 mg | ORAL_TABLET | Freq: Once | ORAL | Status: AC
  Administered 2013-07-19: 40 mg via ORAL
  Filled 2013-07-19: qty 2

## 2013-07-19 NOTE — ED Provider Notes (Signed)
CSN: [id number]     Arrival date & time 07/19/13  1007 History   This chart was scribed for Geoffery Lyons, MD, by Yevette Edwards, ED Scribe. This patient was seen in room APA01/APA01 and the patient's care was started at 10:42 AM. First MD Initiated Contact with Patient 07/19/13 1038     Chief Complaint  Patient presents with  . Shortness of Breath   The history is provided by the patient. No language interpreter was used.   HPI Comments: Amy Cobb is a 52 y.o. female, with a h/o COPD and asthma, who presents to the Emergency Department complaining of SOB which has been gradually increasing for the past few days.  She also complains of otalgia, cold symptoms including a productive cough, a sore throat, and pain to her tongue which have been persistent for two weeks. The pt uses a nebulizer at home and symbicort; she has used both with minimal relief. She has a h/o using prednisone, but she has not used any in 2-3 years.The pt denies a h/o cardiac issues. The pt has a h/o HTN and DM. She is a former smoker; she quit 4 years ago.   Past Medical History  Diagnosis Date  . COPD (chronic obstructive pulmonary disease)   . Hypertension   . Asthma   . Diabetes mellitus without complication   . Depression    Past Surgical History  Procedure Laterality Date  . Abdominal hysterectomy    . Shoulder surgery    . Cervical fusion     No family history on file. History  Substance Use Topics  . Smoking status: Former Games developer  . Smokeless tobacco: Former Neurosurgeon    Quit date: 12/17/2009  . Alcohol Use: Yes     Comment: occ   No OB history provided.  Review of Systems  Constitutional: Positive for fever (99.5 F).  HENT: Positive for congestion, ear pain and sore throat.   Respiratory: Positive for cough and shortness of breath.   All other systems reviewed and are negative.    Allergies  Augmentin and Penicillins  Home Medications   Current Outpatient Rx  Name  Route  Sig   Dispense  Refill  . albuterol (PROVENTIL HFA;VENTOLIN HFA) 108 (90 BASE) MCG/ACT inhaler   Inhalation   Inhale 2 puffs into the lungs every 6 (six) hours as needed for wheezing or shortness of breath.         Marland Kitchen albuterol (PROVENTIL) (2.5 MG/3ML) 0.083% nebulizer solution   Nebulization   Take 2.5 mg by nebulization every 6 (six) hours as needed for wheezing or shortness of breath.         Marland Kitchen azithromycin (ZITHROMAX Z-PAK) 250 MG tablet      Take 2 po the first day then once a day for the next 4 days.   6 tablet   0   . budesonide-formoterol (SYMBICORT) 160-4.5 MCG/ACT inhaler   Inhalation   Inhale 2 puffs into the lungs 2 (two) times daily.         . cloNIDine (CATAPRES) 0.1 MG tablet   Oral   Take 0.1 mg by mouth daily.         . cloNIDine (CATAPRES) 0.1 MG tablet      Take 1 prn BP over 190 systolic   6 tablet   0   . diltiazem (TIAZAC) 240 MG 24 hr capsule   Oral   Take 240 mg by mouth daily.         Marland Kitchen  FLUoxetine (PROZAC) 20 MG capsule   Oral   Take 20 mg by mouth daily.         . meclizine (ANTIVERT) 25 MG tablet      Take 1 or 2 po Q 6hrs for dizziness   60 tablet   0   . metoprolol (LOPRESSOR) 100 MG tablet   Oral   Take 100 mg by mouth 2 (two) times daily.         . montelukast (SINGULAIR) 10 MG tablet   Oral   Take 10 mg by mouth at bedtime.         . ondansetron (ZOFRAN) 4 MG tablet   Oral   Take 1 tablet (4 mg total) by mouth every 6 (six) hours as needed for nausea (or dizziness).   8 tablet   0   . sitaGLIPtan-metformin (JANUMET) 50-1000 MG per tablet   Oral   Take 1 tablet by mouth 2 (two) times daily with a meal.         . tiotropium (SPIRIVA) 18 MCG inhalation capsule   Inhalation   Place 18 mcg into inhaler and inhale daily.         . traZODone (DESYREL) 100 MG tablet   Oral   Take 100 mg by mouth at bedtime.          Triage Vitals: BP 199/97  Pulse 75  Temp(Src) 99.5 F (37.5 C) (Oral)  Resp 20  Ht 5\' 7"   (1.702 m)  Wt 165 lb (74.844 kg)  BMI 25.84 kg/m2  SpO2 98%  Physical Exam  Nursing note and vitals reviewed. Constitutional: She is oriented to person, place, and time. She appears well-developed and well-nourished. No distress.  HENT:  Head: Normocephalic and atraumatic.  Right Ear: External ear normal.  Left Ear: External ear normal.  Mouth/Throat: Oropharynx is clear and moist. No oropharyngeal exudate.  Eyes: Conjunctivae and EOM are normal.  Neck: Neck supple. No tracheal deviation present.  Cardiovascular: Normal rate, regular rhythm, normal heart sounds and intact distal pulses.   No murmur heard. Pulmonary/Chest: Effort normal. No respiratory distress. She has no wheezes. She has no rales.  Scattered rhonchi bilaterally. No respiratory distress.   Abdominal: Soft. There is no tenderness.  Musculoskeletal: Normal range of motion.  Neurological: She is alert and oriented to person, place, and time.  Skin: Skin is warm and dry.  Psychiatric: She has a normal mood and affect. Her behavior is normal.    ED Course  Procedures (including critical care time)  DIAGNOSTIC STUDIES: Oxygen Saturation is 98% on room air, normal by my interpretation.    COORDINATION OF CARE:  10:49 AM- Discussed treatment plan with patient, and the patient agreed to the plan.   Labs Review Labs Reviewed - No data to display Imaging Review No results found.    MDM  No diagnosis found. Patient is a 52 year old female with history of hypertension and COPD. She presents with complaints of chest congestion which has been ongoing for 2 weeks. She reports productive cough and feeling fever but has not taken her temperature. Physical exam reveal scattered rhonchi but no respiratory distress. Chest x-ray does not show acute infiltrate. This appears to be a bronchitis which is triggering an exacerbation of her COPD. She will be treated with Zithromax, prednisone, and cough medication. She has a  nebulizer at home and has been encouraged to perform albuterol treatments every 4 hours. She is to return if her symptoms worsen or change.  I personally performed the services described in this documentation, which was scribed in my presence. The recorded information has been reviewed and is accurate.     Geoffery Lyons, MD 07/19/13 1154

## 2013-07-19 NOTE — ED Notes (Signed)
Pt c/o cold symptoms, SOB, bilateral earache, sore throat, and tongue sore x 2 weeks.

## 2013-11-10 ENCOUNTER — Emergency Department (HOSPITAL_COMMUNITY)
Admission: EM | Admit: 2013-11-10 | Discharge: 2013-11-10 | Disposition: A | Discharge status: Home / Self Care | Payer: Self-pay | Attending: Emergency Medicine | Admitting: Emergency Medicine

## 2013-11-10 ENCOUNTER — Encounter (HOSPITAL_COMMUNITY): Payer: Self-pay | Admitting: Emergency Medicine

## 2013-11-10 DIAGNOSIS — Z79899 Other long term (current) drug therapy: Secondary | ICD-10-CM | POA: Insufficient documentation

## 2013-11-10 DIAGNOSIS — J441 Chronic obstructive pulmonary disease with (acute) exacerbation: Secondary | ICD-10-CM | POA: Insufficient documentation

## 2013-11-10 DIAGNOSIS — Z880 Allergy status to penicillin: Secondary | ICD-10-CM | POA: Insufficient documentation

## 2013-11-10 DIAGNOSIS — I10 Essential (primary) hypertension: Secondary | ICD-10-CM | POA: Insufficient documentation

## 2013-11-10 DIAGNOSIS — Y9389 Activity, other specified: Secondary | ICD-10-CM | POA: Insufficient documentation

## 2013-11-10 DIAGNOSIS — X500XXA Overexertion from strenuous movement or load, initial encounter: Secondary | ICD-10-CM | POA: Insufficient documentation

## 2013-11-10 DIAGNOSIS — Z792 Long term (current) use of antibiotics: Secondary | ICD-10-CM | POA: Insufficient documentation

## 2013-11-10 DIAGNOSIS — F329 Major depressive disorder, single episode, unspecified: Secondary | ICD-10-CM | POA: Insufficient documentation

## 2013-11-10 DIAGNOSIS — H9209 Otalgia, unspecified ear: Secondary | ICD-10-CM | POA: Insufficient documentation

## 2013-11-10 DIAGNOSIS — R42 Dizziness and giddiness: Secondary | ICD-10-CM | POA: Insufficient documentation

## 2013-11-10 DIAGNOSIS — E119 Type 2 diabetes mellitus without complications: Secondary | ICD-10-CM | POA: Insufficient documentation

## 2013-11-10 DIAGNOSIS — R51 Headache: Secondary | ICD-10-CM | POA: Insufficient documentation

## 2013-11-10 DIAGNOSIS — Y929 Unspecified place or not applicable: Secondary | ICD-10-CM | POA: Insufficient documentation

## 2013-11-10 DIAGNOSIS — IMO0002 Reserved for concepts with insufficient information to code with codable children: Secondary | ICD-10-CM | POA: Insufficient documentation

## 2013-11-10 DIAGNOSIS — F3289 Other specified depressive episodes: Secondary | ICD-10-CM | POA: Insufficient documentation

## 2013-11-10 DIAGNOSIS — Z87891 Personal history of nicotine dependence: Secondary | ICD-10-CM | POA: Insufficient documentation

## 2013-11-10 DIAGNOSIS — J45901 Unspecified asthma with (acute) exacerbation: Secondary | ICD-10-CM

## 2013-11-10 DIAGNOSIS — Z7982 Long term (current) use of aspirin: Secondary | ICD-10-CM | POA: Insufficient documentation

## 2013-11-10 MED ORDER — CLONIDINE HCL 0.1 MG PO TABS: 0.1000 mg | ORAL_TABLET | Freq: Three times a day (TID) | ORAL | Status: DC

## 2013-11-10 MED ORDER — DILTIAZEM HCL ER BEADS 360 MG PO CP24: 360.0000 mg | ORAL_CAPSULE | Freq: Every day | ORAL | Status: DC

## 2013-11-10 MED ORDER — METHOCARBAMOL 500 MG PO TABS: 500.0000 mg | ORAL_TABLET | Freq: Two times a day (BID) | ORAL | Status: DC

## 2013-11-10 MED ORDER — METOPROLOL TARTRATE 100 MG PO TABS: 100.0000 mg | ORAL_TABLET | Freq: Two times a day (BID) | ORAL | Status: DC

## 2013-11-10 NOTE — ED Provider Notes (Signed)
CSN: [id number]     Arrival date & time 11/10/13  1358 History  This chart was scribed for Amy Manifold, MD,  by Stacy Gardner, ED Scribe. The patient was seen in room APA12/APA12 and the patient's care was started at 2:29 PM.     First MD Initiated Contact with Patient 11/10/13 1419     Chief Complaint  Patient presents with  . Hypertension  . Back Pain     (Consider location/radiation/quality/duration/timing/severity/associated sxs/prior Treatment) Patient is a 53 y.o. female presenting with hypertension and back pain. The history is provided by the patient and medical records. No language interpreter was used.  Hypertension This is a chronic problem. The problem occurs constantly. Associated symptoms include headaches and shortness of breath.  Back Pain Associated symptoms: headaches    HPI Comments: Amy Cobb is a 53 y.o. female who presents to the Emergency Department complaining of HTN after getting evaluated at [facility] and having a reading of 189/102. While in the ED pt has a BP reading of 206/116. Pt reports taking her medication as instructed.She complains of having lower back spasms. Pt explains that she twisted her back and the pain remains unchanged since the incident. Pt did not try anything for pain. She reports having chronic SOB at baseline but attributes this to her COPD. Pt has a slight headache. She does not measure her bp on a regular basis. She also complains of dizziness with getting up and with walking. Pt mentions having episodes of vertigo especially with her seasonal allergies. Nothing seems to resolve her symptoms. She plans to make an appointment with her PCP.  Past Medical History  Diagnosis Date  . COPD (chronic obstructive pulmonary disease)   . Hypertension   . Asthma   . Diabetes mellitus without complication   . Depression    Past Surgical History  Procedure Laterality Date  . Abdominal hysterectomy    . Shoulder surgery    . Cervical  fusion     No family history on file. History  Substance Use Topics  . Smoking status: Former Research scientist (life sciences)  . Smokeless tobacco: Former Systems developer    Quit date: 12/17/2009  . Alcohol Use: Yes     Comment: occ   OB History   Grav Para Term Preterm Abortions TAB SAB Ect Mult Living                 Review of Systems  Eyes:       Ear discomfort   Respiratory: Positive for shortness of breath.   Cardiovascular:       HTN  Musculoskeletal: Positive for back pain.  Neurological: Positive for dizziness and headaches.  All other systems reviewed and are negative.      Allergies  Augmentin and Penicillins  Home Medications   Current Outpatient Rx  Name  Route  Sig  Dispense  Refill  . albuterol (PROVENTIL HFA;VENTOLIN HFA) 108 (90 BASE) MCG/ACT inhaler   Inhalation   Inhale 2 puffs into the lungs every 6 (six) hours as needed for wheezing or shortness of breath.         Marland Kitchen albuterol (PROVENTIL) (2.5 MG/3ML) 0.083% nebulizer solution   Nebulization   Take 2.5 mg by nebulization every 8 (eight) hours as needed for wheezing or shortness of breath.          Marland Kitchen aspirin 81 MG chewable tablet   Oral   Chew 81 mg by mouth daily.         Marland Kitchen  azithromycin (ZITHROMAX Z-PAK) 250 MG tablet      2 po day one, then 1 daily x 4 days   6 tablet   0   . budesonide-formoterol (SYMBICORT) 160-4.5 MCG/ACT inhaler   Inhalation   Inhale 2 puffs into the lungs 2 (two) times daily.         . cloNIDine (CATAPRES) 0.1 MG tablet   Oral   Take 0.1 mg by mouth 3 (three) times daily.          Marland Kitchen diltiazem (TIAZAC) 240 MG 24 hr capsule   Oral   Take 240 mg by mouth daily.         Marland Kitchen FLUoxetine (PROZAC) 40 MG capsule   Oral   Take 40 mg by mouth daily.         Marland Kitchen guaiFENesin-codeine 100-10 MG/5ML syrup   Oral   Take 10 mLs by mouth every 6 (six) hours as needed for cough.   120 mL   0   . metoprolol (LOPRESSOR) 100 MG tablet   Oral   Take 100 mg by mouth 2 (two) times daily.          . montelukast (SINGULAIR) 10 MG tablet   Oral   Take 10 mg by mouth at bedtime.         . predniSONE (DELTASONE) 10 MG tablet   Oral   Take 2 tablets (20 mg total) by mouth 2 (two) times daily.   20 tablet   0   . sitaGLIPtan-metformin (JANUMET) 50-1000 MG per tablet   Oral   Take 1 tablet by mouth 2 (two) times daily with a meal.         . tiotropium (SPIRIVA) 18 MCG inhalation capsule   Inhalation   Place 18 mcg into inhaler and inhale daily.         . traZODone (DESYREL) 100 MG tablet   Oral   Take 100 mg by mouth daily as needed.           BP 185/95  Pulse 74  Temp(Src) 98.6 F (37 C) (Oral)  Resp 18  Ht 5\' 7"  (1.702 m)  Wt 165 lb (74.844 kg)  BMI 25.84 kg/m2  SpO2 99% Physical Exam  Nursing note and vitals reviewed. Constitutional: She is oriented to person, place, and time. She appears well-developed and well-nourished. No distress.  HENT:  Head: Normocephalic and atraumatic.  Eyes: EOM are normal.  Neck: Normal range of motion.  Cardiovascular: Normal rate, regular rhythm and normal heart sounds.   No murmur heard. Pulmonary/Chest: Effort normal and breath sounds normal. No respiratory distress. She has no wheezes. She has no rales.  Abdominal: Soft. She exhibits no distension. There is no tenderness.  Musculoskeletal: Normal range of motion.  Neurological: She is alert and oriented to person, place, and time.  Skin: Skin is warm and dry.  Psychiatric: She has a normal mood and affect. Judgment normal.    ED Course  Procedures (including critical care time) DIAGNOSTIC STUDIES: Oxygen Saturation is 99% on room air, normal by my interpretation.    COORDINATION OF CARE:  2:35 PM Discussed course of care with pt . Pt understands and agrees.    Labs Review Labs Reviewed - No data to display Imaging Review No results found.   EKG Interpretation None      MDM   Final diagnoses:  Hypertension    [age]-year-old female with hypertension.  No acute complaints. Exam is nonfocal. Provided prescriptions for her  previously prescribed medications and made a small increase in her diltiazem. Discussed the need to followup with her PCP to discuss further. Discussed the need to keep a log of her blood pressures take this appointment. Return precautions were discussed.   I personally preformed the services scribed in my presence. The recorded information has been reviewed is accurate. Amy Manifold, MD.    Amy Manifold, MD 11/15/13 [phone]

## 2013-11-10 NOTE — Discharge Instructions (Signed)
Arterial Hypertension °Arterial hypertension (high blood pressure) is a condition of elevated pressure in your blood vessels. Hypertension over a long period of time is a risk factor for strokes, heart attacks, and heart failure. It is also the leading cause of kidney (renal) failure.  °CAUSES  °· In Adults -- Over 90% of all hypertension has no known cause. This is called essential or primary hypertension. In the other 10% of people with hypertension, the increase in blood pressure is caused by another disorder. This is called secondary hypertension. Important causes of secondary hypertension are: °· Heavy alcohol use. °· Obstructive sleep apnea. °· Hyperaldosterosim (Conn's syndrome). °· Steroid use. °· Chronic kidney failure. °· Hyperparathyroidism. °· Medications. °· Renal artery stenosis. °· Pheochromocytoma. °· Cushing's disease. °· Coarctation of the aorta. °· Scleroderma renal crisis. °· Licorice (in excessive amounts). °· Drugs (cocaine, methamphetamine). °Your caregiver can explain any items above that apply to you. °· In Children -- Secondary hypertension is more common and should always be considered. °· Pregnancy -- Few women of childbearing age have high blood pressure. However, up to 10% of them develop hypertension of pregnancy. Generally, this will not harm the woman. It may be a sign of 3 complications of pregnancy: preeclampsia, HELLP syndrome, and eclampsia. Follow up and control with medication is necessary. °SYMPTOMS  °· This condition normally does not produce any noticeable symptoms. It is usually found during a routine exam. °· Malignant hypertension is a late problem of high blood pressure. It may have the following symptoms: °· Headaches. °· Blurred vision. °· End-organ damage (this means your kidneys, heart, lungs, and other organs are being damaged). °· Stressful situations can increase the blood pressure. If a person with normal blood pressure has their blood pressure go up while being  seen by their caregiver, this is often termed "white coat hypertension." Its importance is not known. It may be related with eventually developing hypertension or complications of hypertension. °· Hypertension is often confused with mental tension, stress, and anxiety. °DIAGNOSIS  °The diagnosis is made by 3 separate blood pressure measurements. They are taken at least 1 week apart from each other. If there is organ damage from hypertension, the diagnosis may be made without repeat measurements. °Hypertension is usually identified by having blood pressure readings: °· Above 140/90 mmHg measured in both arms, at 3 separate times, over a couple weeks. °· Over 130/80 mmHg should be considered a risk factor and may require treatment in patients with diabetes. °Blood pressure readings over 120/80 mmHg are called "pre-hypertension" even in non-diabetic patients. °To get a true blood pressure measurement, use the following guidelines. Be aware of the factors that can alter blood pressure readings. °· Take measurements at least 1 hour after caffeine. °· Take measurements 30 minutes after smoking and without any stress. This is another reason to quit smoking  it raises your blood pressure. °· Use a proper cuff size. Ask your caregiver if you are not sure about your cuff size. °· Most home blood pressure cuffs are automatic. They will measure systolic and diastolic pressures. The systolic pressure is the pressure reading at the start of sounds. Diastolic pressure is the pressure at which the sounds disappear. If you are elderly, measure pressures in multiple postures. Try sitting, lying or standing. °· Sit at rest for a minimum of 5 minutes before taking measurements. °· You should not be on any medications like decongestants. These are found in many cold medications. °· Record your blood pressure readings and review   them with your caregiver. °If you have hypertension: °· Your caregiver may do tests to be sure you do not have  secondary hypertension (see "causes" above). °· Your caregiver may also look for signs of metabolic syndrome. This is also called Syndrome X or Insulin Resistance Syndrome. You may have this syndrome if you have type 2 diabetes, abdominal obesity, and abnormal blood lipids in addition to hypertension. °· Your caregiver will take your medical and family history and perform a physical exam. °· Diagnostic tests may include blood tests (for glucose, cholesterol, potassium, and kidney function), a urinalysis, or an EKG. Other tests may also be necessary depending on your condition. °PREVENTION  °There are important lifestyle issues that you can adopt to reduce your chance of developing hypertension: °· Maintain a normal weight. °· Limit the amount of salt (sodium) in your diet. °· Exercise often. °· Limit alcohol intake. °· Get enough potassium in your diet. Discuss specific advice with your caregiver. °· Follow a DASH diet (dietary approaches to stop hypertension). This diet is rich in fruits, vegetables, and low-fat dairy products, and avoids certain fats. °PROGNOSIS  °Essential hypertension cannot be cured. Lifestyle changes and medical treatment can lower blood pressure and reduce complications. The prognosis of secondary hypertension depends on the underlying cause. Many people whose hypertension is controlled with medicine or lifestyle changes can live a normal, healthy life.  °RISKS AND COMPLICATIONS  °While high blood pressure alone is not an illness, it often requires treatment due to its short- and long-term effects on many organs. Hypertension increases your risk for: °· CVAs or strokes (cerebrovascular accident). °· Heart failure due to chronically high blood pressure (hypertensive cardiomyopathy). °· Heart attack (myocardial infarction). °· Damage to the retina (hypertensive retinopathy). °· Kidney failure (hypertensive nephropathy). °Your caregiver can explain list items above that apply to you. Treatment  of hypertension can significantly reduce the risk of complications. °TREATMENT  °· For overweight patients, weight loss and regular exercise are recommended. Physical fitness lowers blood pressure. °· Mild hypertension is usually treated with diet and exercise. A diet rich in fruits and vegetables, fat-free dairy products, and foods low in fat and salt (sodium) can help lower blood pressure. Decreasing salt intake decreases blood pressure in a 1/3 of people. °· Stop smoking if you are a smoker. °The steps above are highly effective in reducing blood pressure. While these actions are easy to suggest, they are difficult to achieve. Most patients with moderate or severe hypertension end up requiring medications to bring their blood pressure down to a normal level. There are several classes of medications for treatment. Blood pressure pills (antihypertensives) will lower blood pressure by their different actions. Lowering the blood pressure by 10 mmHg may decrease the risk of complications by as much as 25%. °The goal of treatment is effective blood pressure control. This will reduce your risk for complications. Your caregiver will help you determine the best treatment for you according to your lifestyle. What is excellent treatment for one person, may not be for you. °HOME CARE INSTRUCTIONS  °· Do not smoke. °· Follow the lifestyle changes outlined in the "Prevention" section. °· If you are on medications, follow the directions carefully. Blood pressure medications must be taken as prescribed. Skipping doses reduces their benefit. It also puts you at risk for problems. °· Follow up with your caregiver, as directed. °· If you are asked to monitor your blood pressure at home, follow the guidelines in the "Diagnosis" section above. °SEEK MEDICAL CARE   IF:  °· You think you are having medication side effects. °· You have recurrent headaches or lightheadedness. °· You have swelling in your ankles. °· You have trouble with  your vision. °SEEK IMMEDIATE MEDICAL CARE IF:  °· You have sudden onset of chest pain or pressure, difficulty breathing, or other symptoms of a heart attack. °· You have a severe headache. °· You have symptoms of a stroke (such as sudden weakness, difficulty speaking, difficulty walking). °MAKE SURE YOU:  °· Understand these instructions. °· Will watch your condition. °· Will get help right away if you are not doing well or get worse. °Document Released: 08/06/2005 Document Revised: 10/29/2011 Document Reviewed: 03/06/2007 °ExitCare® Patient Information ©2014 [employer]. ° °

## 2013-11-10 NOTE — ED Notes (Signed)
Sent here from [facility] for HTN - reports bp there was 189/102 with hx of htn and has taken meds today.  C/o slight headache and also c/o back spasms.

## 2013-11-10 NOTE — ED Notes (Signed)
Pt also with lower back pain for some time, pt thinks she may have moved wrong, denies any known injury, also with ear discomfort for a week as well

## 2013-12-18 ENCOUNTER — Other Ambulatory Visit (HOSPITAL_COMMUNITY): Payer: Self-pay | Admitting: Family Medicine

## 2013-12-18 ENCOUNTER — Ambulatory Visit (HOSPITAL_COMMUNITY)
Admission: RE | Admit: 2013-12-18 | Discharge: 2013-12-18 | Disposition: A | Discharge status: Home / Self Care | Payer: Self-pay | Source: Ambulatory Visit | Attending: Family Medicine | Admitting: Family Medicine

## 2013-12-18 DIAGNOSIS — J449 Chronic obstructive pulmonary disease, unspecified: Secondary | ICD-10-CM

## 2013-12-18 DIAGNOSIS — R059 Cough, unspecified: Secondary | ICD-10-CM | POA: Insufficient documentation

## 2013-12-18 DIAGNOSIS — R05 Cough: Secondary | ICD-10-CM | POA: Insufficient documentation

## 2013-12-18 DIAGNOSIS — R079 Chest pain, unspecified: Secondary | ICD-10-CM | POA: Insufficient documentation

## 2013-12-18 DIAGNOSIS — J4489 Other specified chronic obstructive pulmonary disease: Secondary | ICD-10-CM | POA: Insufficient documentation

## 2014-01-14 ENCOUNTER — Encounter: Payer: Self-pay | Admitting: Cardiology

## 2014-01-14 DIAGNOSIS — I3139 Other pericardial effusion (noninflammatory): Secondary | ICD-10-CM | POA: Insufficient documentation

## 2014-01-14 DIAGNOSIS — R072 Precordial pain: Secondary | ICD-10-CM | POA: Insufficient documentation

## 2014-01-15 ENCOUNTER — Encounter: Payer: Self-pay | Admitting: *Deleted

## 2014-01-15 ENCOUNTER — Ambulatory Visit (INDEPENDENT_AMBULATORY_CARE_PROVIDER_SITE_OTHER): Payer: Medicaid Other | Admitting: Cardiology

## 2014-01-15 ENCOUNTER — Encounter: Payer: Self-pay | Admitting: Cardiology

## 2014-01-15 VITALS — BP 178/98 | HR 81 | Ht 67.0 in | Wt 174.0 lb

## 2014-01-15 DIAGNOSIS — E119 Type 2 diabetes mellitus without complications: Secondary | ICD-10-CM | POA: Insufficient documentation

## 2014-01-15 DIAGNOSIS — I10 Essential (primary) hypertension: Secondary | ICD-10-CM | POA: Diagnosis not present

## 2014-01-15 DIAGNOSIS — R072 Precordial pain: Secondary | ICD-10-CM

## 2014-01-15 NOTE — Assessment & Plan Note (Signed)
Followed by Dr. Hilma Favors, reportedly long-standing history.

## 2014-01-15 NOTE — Patient Instructions (Signed)
Your physician recommends that you schedule a follow-up appointment in: to be determined. We will call you with test results.  Your physician has requested that you have a lexiscan myoview. For further information please visit [email or web address]. Please follow instruction sheet, as given.

## 2014-01-15 NOTE — Progress Notes (Signed)
Clinical Summary Amy Cobb is a 53 y.o.female referred for cardiology consultation by Dr. Hilma Favors. She is referred with a history of intermittent chest discomfort that she describes as "reflux." This tends to mainly occur when she is lying down, she states that she eats a lot of onions with her meals, has tried [employer] before but is not on a regular antacid. She does not endorse any exertional component to the symptoms. They can occur during the day when she is standing or seated as well. Sometimes symptoms last for several minutes to hours.  She has a long-standing history of hypertension for several years, medications below are fairly stable however. She has not undergone any prior cardiac ischemic testing. She does have COPD and uses regular inhalers, although has had no major hospitalizations with respiratory failure. Despite this she tolerates high-dose beta blocker.  ECG from April 2014 shows sinus rhythm with poor R wave progression. Recent chest x-ray was normal.   Allergies  Allergen Reactions  . Augmentin [Amoxicillin-Pot Clavulanate]   . Penicillins     Current Outpatient Prescriptions  Medication Sig Dispense Refill  . albuterol (PROVENTIL HFA;VENTOLIN HFA) 108 (90 BASE) MCG/ACT inhaler Inhale 2 puffs into the lungs every 6 (six) hours as needed for wheezing or shortness of breath.      Marland Kitchen albuterol (PROVENTIL) (2.5 MG/3ML) 0.083% nebulizer solution Take 2.5 mg by nebulization every 8 (eight) hours as needed for wheezing or shortness of breath.       Marland Kitchen aspirin 81 MG chewable tablet Chew 81 mg by mouth daily.      . budesonide-formoterol (SYMBICORT) 160-4.5 MCG/ACT inhaler Inhale 2 puffs into the lungs 2 (two) times daily.      . cloNIDine (CATAPRES) 0.1 MG tablet Take 1 tablet (0.1 mg total) by mouth 3 (three) times daily.  90 tablet  0  . FLUoxetine (PROZAC) 40 MG capsule Take 40 mg by mouth daily.      Marland Kitchen guaiFENesin-codeine 100-10 MG/5ML syrup Take 10 mLs by mouth every 6  (six) hours as needed for cough.  120 mL  0  . metoprolol (LOPRESSOR) 100 MG tablet Take 1 tablet (100 mg total) by mouth 2 (two) times daily.  60 tablet  0  . montelukast (SINGULAIR) 10 MG tablet Take 10 mg by mouth at bedtime.      . sitaGLIPtan-metformin (JANUMET) 50-1000 MG per tablet Take 1 tablet by mouth 2 (two) times daily with a meal.      . tiotropium (SPIRIVA) 18 MCG inhalation capsule Place 18 mcg into inhaler and inhale daily.      . traZODone (DESYREL) 100 MG tablet Take 100 mg by mouth daily as needed.        No current facility-administered medications for this visit.    Past Medical History  Diagnosis Date  . COPD (chronic obstructive pulmonary disease)   . Essential hypertension, benign   . Asthma   . Type 2 diabetes mellitus   . Depression     Social History Amy Cobb reports that she has quit smoking. Her smoking use included Cigarettes. She smoked 0.00 packs per day. She quit smokeless tobacco use about 4 years ago. Amy Cobb reports that she drinks alcohol.  Review of Systems No palpitations, dizziness, leg swelling, syncope, claudication. Otherwise as outlined.  Physical Examination Filed Vitals:   01/15/14 1338  BP: 178/98  Pulse: 81   Filed Weights   01/15/14 1338  Weight: 174 lb (78.926 kg)   Patient appears  comfortable at rest. HEENT: Conjunctiva and lids normal, oropharynx clear. Neck: Supple, no elevated JVP or carotid bruits, no thyromegaly. Lungs: Diminished breath sounds, nonlabored breathing at rest. Cardiac: Regular rate and rhythm, soft S4, no significant systolic murmur, no pericardial rub. Abdomen: Soft, nontender, bowel sounds present, no guarding or rebound. Extremities: No pitting edema, distal pulses 2+. Skin: Warm and dry. Musculoskeletal: No kyphosis. Neuropsychiatric: Alert and oriented x3, affect grossly appropriate.   Problem List and Plan   Precordial pain Overall atypical in description. Could be reflux as the  patient suggests, although she does have a long-standing history of hypertension as well as type 2 diabetes mellitus in terms of cardiac risk factors. She has not undergone any prior ischemic workup. Plan is to proceed with a Lexiscan Cardiolite for further evaluation. We will plan to call her with the results.  Essential hypertension, benign Followed by Dr. Hilma Favors, reportedly long-standing history.  Type 2 diabetes mellitus Followed by Dr. Hilma Favors.    Satira Sark, M.D., F.A.C.C.

## 2014-01-15 NOTE — Assessment & Plan Note (Signed)
Overall atypical in description. Could be reflux as the patient suggests, although she does have a long-standing history of hypertension as well as type 2 diabetes mellitus in terms of cardiac risk factors. She has not undergone any prior ischemic workup. Plan is to proceed with a Lexiscan Cardiolite for further evaluation. We will plan to call her with the results.

## 2014-01-15 NOTE — Assessment & Plan Note (Signed)
Followed by Dr. Golding. 

## 2014-01-18 ENCOUNTER — Encounter: Payer: Self-pay | Admitting: Cardiology

## 2014-01-19 ENCOUNTER — Encounter (HOSPITAL_COMMUNITY)
Admission: RE | Admit: 2014-01-19 | Discharge: 2014-01-19 | Disposition: A | Discharge status: Home / Self Care | Payer: Self-pay | Source: Ambulatory Visit | Attending: Cardiology | Admitting: Cardiology

## 2014-01-19 ENCOUNTER — Encounter (HOSPITAL_COMMUNITY): Payer: Self-pay

## 2014-01-19 ENCOUNTER — Ambulatory Visit: Payer: Self-pay | Admitting: Cardiovascular Disease

## 2014-01-19 ENCOUNTER — Ambulatory Visit (HOSPITAL_COMMUNITY)
Admission: RE | Admit: 2014-01-19 | Discharge: 2014-01-19 | Disposition: A | Discharge status: Home / Self Care | Payer: Self-pay | Source: Ambulatory Visit | Attending: Cardiology | Admitting: Cardiology

## 2014-01-19 DIAGNOSIS — I10 Essential (primary) hypertension: Secondary | ICD-10-CM | POA: Insufficient documentation

## 2014-01-19 DIAGNOSIS — R072 Precordial pain: Secondary | ICD-10-CM | POA: Diagnosis not present

## 2014-01-19 DIAGNOSIS — R079 Chest pain, unspecified: Secondary | ICD-10-CM | POA: Insufficient documentation

## 2014-01-19 DIAGNOSIS — E119 Type 2 diabetes mellitus without complications: Secondary | ICD-10-CM | POA: Insufficient documentation

## 2014-01-19 MED ORDER — SODIUM CHLORIDE 0.9 % IJ SOLN
INTRAMUSCULAR | Status: AC
  Administered 2014-01-19: 10 mL via INTRAVENOUS
  Filled 2014-01-19: qty 10

## 2014-01-19 MED ORDER — REGADENOSON 0.4 MG/5ML IV SOLN
INTRAVENOUS | Status: AC
  Administered 2014-01-19: 0.4 mg via INTRAVENOUS
  Filled 2014-01-19: qty 5

## 2014-01-19 MED ORDER — TECHNETIUM TC 99M SESTAMIBI GENERIC - CARDIOLITE
10.0000 | Freq: Once | INTRAVENOUS | Status: AC | PRN
  Administered 2014-01-19: 10 via INTRAVENOUS

## 2014-01-19 MED ORDER — TECHNETIUM TC 99M SESTAMIBI - CARDIOLITE
30.0000 | Freq: Once | INTRAVENOUS | Status: AC | PRN
  Administered 2014-01-19: 30 via INTRAVENOUS

## 2014-01-19 NOTE — Progress Notes (Signed)
Stress Lab Nurses Notes - [place] 01/19/2014 Reason for doing test: Chest Pain Type of test: Wille Glaser Nurse performing test: Gerrit Halls, RN Nuclear Medicine Tech: Dyanne Carrel Echo Tech: Not Applicable MD performing test: Koneswaran/K.Purcell Nails NP Family MD: Hilma Favors Test explained and consent signed: yes IV started: 22g jelco, Saline lock flushed, No redness or edema and Saline lock started in radiology Symptoms: Sob & stomach discomfort Treatment/Intervention: None Reason test stopped: protocol completed After recovery IV was: Discontinued via X-ray tech and No redness or edema Patient to return to [facility]. Med at : 11:35 Patient discharged: Home Patient's Condition upon discharge was: stable Comments: During test BP 222/113 & HR 141.  Recovery BP 189/104 & HR 105. Symptoms resolved in recovery. Donnajean Lopes

## 2014-01-21 DIAGNOSIS — IMO0001 Reserved for inherently not codable concepts without codable children: Secondary | ICD-10-CM | POA: Diagnosis not present

## 2014-03-29 DIAGNOSIS — F321 Major depressive disorder, single episode, moderate: Secondary | ICD-10-CM | POA: Diagnosis not present

## 2014-04-22 DIAGNOSIS — I10 Essential (primary) hypertension: Secondary | ICD-10-CM | POA: Diagnosis not present

## 2014-04-22 DIAGNOSIS — IMO0001 Reserved for inherently not codable concepts without codable children: Secondary | ICD-10-CM | POA: Diagnosis not present

## 2014-05-03 ENCOUNTER — Telehealth (HOSPITAL_COMMUNITY): Payer: Self-pay | Admitting: Dietician

## 2014-05-03 NOTE — Telephone Encounter (Addendum)
Received overhead page in hospital. Informed Ms. Baik is waiting at the front desk requesting to be seen by RD. Informed Ms. Starn that this RD is no longer accepting new referral due to services being transitioned to [place]. She reports she is a pt of Dr. Dorris Fetch and would like to find out about area support groups and classes. She has called several places and not received a return call.  Informed her that [facility] will be operational at the end of the month and that a referral is required for an individual appointment. Classes will also be offered, however unsure of class schedule for October at this time.  Offered to bring Ms. Flannagan list of Diabetes Resources in [place] along with education handouts to review until she can get an appointment, however, she requests that these material be sent to her via [place] [employer]. Confirmed mailing address. Mailed list of Diabetes Resources in [place], [facility]'s Meal Planning- Plate Method handout, as well as [facility]'s "Your 1500 Calorie Meal Plan", "What is Diabetes", "Type 2 Diabetes and Insulin", 'Diabetes and prediabetes", "Checking Your Blood Sugar", "Type 2 Diabetes Changes: How and Why", "Managing Diabetes Safely During Sick Days", and "Traveling Safely With Diabetes". Ms. Babiarz was grateful for information.   Chamya Hunton A. Jimmye Norman, RD, LDN Pager: [phone]

## 2014-05-19 ENCOUNTER — Emergency Department (HOSPITAL_COMMUNITY): Payer: Self-pay

## 2014-05-19 ENCOUNTER — Emergency Department (HOSPITAL_COMMUNITY)
Admission: EM | Admit: 2014-05-19 | Discharge: 2014-05-19 | Disposition: A | Discharge status: Home / Self Care | Payer: Self-pay | Attending: Emergency Medicine | Admitting: Emergency Medicine

## 2014-05-19 ENCOUNTER — Encounter (HOSPITAL_COMMUNITY): Payer: Self-pay | Admitting: Emergency Medicine

## 2014-05-19 DIAGNOSIS — J4489 Other specified chronic obstructive pulmonary disease: Secondary | ICD-10-CM | POA: Insufficient documentation

## 2014-05-19 DIAGNOSIS — R0600 Dyspnea, unspecified: Secondary | ICD-10-CM

## 2014-05-19 DIAGNOSIS — F329 Major depressive disorder, single episode, unspecified: Secondary | ICD-10-CM | POA: Insufficient documentation

## 2014-05-19 DIAGNOSIS — Z87891 Personal history of nicotine dependence: Secondary | ICD-10-CM | POA: Insufficient documentation

## 2014-05-19 DIAGNOSIS — Z79899 Other long term (current) drug therapy: Secondary | ICD-10-CM | POA: Insufficient documentation

## 2014-05-19 DIAGNOSIS — Z794 Long term (current) use of insulin: Secondary | ICD-10-CM | POA: Insufficient documentation

## 2014-05-19 DIAGNOSIS — R0609 Other forms of dyspnea: Secondary | ICD-10-CM | POA: Insufficient documentation

## 2014-05-19 DIAGNOSIS — E119 Type 2 diabetes mellitus without complications: Secondary | ICD-10-CM | POA: Insufficient documentation

## 2014-05-19 DIAGNOSIS — Z7982 Long term (current) use of aspirin: Secondary | ICD-10-CM | POA: Insufficient documentation

## 2014-05-19 DIAGNOSIS — F3289 Other specified depressive episodes: Secondary | ICD-10-CM | POA: Insufficient documentation

## 2014-05-19 DIAGNOSIS — J449 Chronic obstructive pulmonary disease, unspecified: Secondary | ICD-10-CM | POA: Insufficient documentation

## 2014-05-19 DIAGNOSIS — I10 Essential (primary) hypertension: Secondary | ICD-10-CM | POA: Insufficient documentation

## 2014-05-19 DIAGNOSIS — R079 Chest pain, unspecified: Secondary | ICD-10-CM | POA: Insufficient documentation

## 2014-05-19 DIAGNOSIS — R0989 Other specified symptoms and signs involving the circulatory and respiratory systems: Secondary | ICD-10-CM | POA: Insufficient documentation

## 2014-05-19 LAB — COMPREHENSIVE METABOLIC PANEL
ALBUMIN: 3.3 g/dL — AB (ref 3.5–5.2)
ALK PHOS: 85 U/L (ref 39–117)
ALT: 13 U/L (ref 0–35)
AST: 15 U/L (ref 0–37)
Anion gap: 10 (ref 5–15)
BILIRUBIN TOTAL: 0.3 mg/dL (ref 0.3–1.2)
BUN: 10 mg/dL (ref 6–23)
CHLORIDE: 92 meq/L — AB (ref 96–112)
CO2: 31 mEq/L (ref 19–32)
Calcium: 9.8 mg/dL (ref 8.4–10.5)
Creatinine, Ser: 0.59 mg/dL (ref 0.50–1.10)
GFR calc non Af Amer: >90 mL/min (ref >90)
GLUCOSE: 274 mg/dL — AB (ref 70–99)
POTASSIUM: 4.3 meq/L (ref 3.7–5.3)
SODIUM: 133 meq/L — AB (ref 137–147)
TOTAL PROTEIN: 7.5 g/dL (ref 6.0–8.3)

## 2014-05-19 LAB — CBC WITH DIFFERENTIAL/PLATELET
Basophils Absolute: 0 10*3/uL (ref 0.0–0.1)
Basophils Relative: 0 % (ref 0–1)
EOS ABS: 0.2 10*3/uL (ref 0.0–0.7)
Eosinophils Relative: 2 % (ref 0–5)
HCT: 42.3 % (ref 36.0–46.0)
Hemoglobin: 14.2 g/dL (ref 12.0–15.0)
Lymphocytes Relative: 27 % (ref 12–46)
Lymphs Abs: 2.2 10*3/uL (ref 0.7–4.0)
MCH: 27.2 pg (ref 26.0–34.0)
MCHC: 33.6 g/dL (ref 30.0–36.0)
MCV: 80.9 fL (ref 78.0–100.0)
Monocytes Absolute: 0.6 10*3/uL (ref 0.1–1.0)
Monocytes Relative: 8 % (ref 3–12)
NEUTROS PCT: 63 % (ref 43–77)
Neutro Abs: 5.1 10*3/uL (ref 1.7–7.7)
PLATELETS: 309 10*3/uL (ref 150–400)
RBC: 5.23 MIL/uL — AB (ref 3.87–5.11)
RDW: 14.1 % (ref 11.5–15.5)
WBC: 8.1 10*3/uL (ref 4.0–10.5)

## 2014-05-19 LAB — URINALYSIS, ROUTINE W REFLEX MICROSCOPIC
BILIRUBIN URINE: NEGATIVE
Glucose, UA: >1000 mg/dL — AB
HGB URINE DIPSTICK: NEGATIVE
Ketones, ur: NEGATIVE mg/dL
Leukocytes, UA: NEGATIVE
Nitrite: NEGATIVE
PH: 6.5 (ref 5.0–8.0)
Protein, ur: NEGATIVE mg/dL
Specific Gravity, Urine: 1.02 (ref 1.005–1.030)
Urobilinogen, UA: 0.2 mg/dL (ref 0.0–1.0)

## 2014-05-19 LAB — LIPASE, BLOOD: Lipase: 39 U/L (ref 11–59)

## 2014-05-19 LAB — URINE MICROSCOPIC-ADD ON

## 2014-05-19 LAB — POC URINE PREG, ED: Preg Test, Ur: NEGATIVE

## 2014-05-19 LAB — TROPONIN I

## 2014-05-19 MED ORDER — PREDNISONE 50 MG PO TABS
60.0000 mg | ORAL_TABLET | Freq: Once | ORAL | Status: AC
  Administered 2014-05-19: 60 mg via ORAL
  Filled 2014-05-19 (×2): qty 1

## 2014-05-19 MED ORDER — LABETALOL HCL 5 MG/ML IV SOLN: 20.0000 mg | Freq: Once | INTRAVENOUS | Status: DC

## 2014-05-19 MED ORDER — ANTIPYRINE-BENZOCAINE 5.4-1.4 % OT SOLN
3.0000 [drp] | OTIC | Status: DC | PRN
  Administered 2014-05-19: 4 [drp] via OTIC
  Filled 2014-05-19: qty 10

## 2014-05-19 MED ORDER — ALBUTEROL SULFATE (2.5 MG/3ML) 0.083% IN NEBU
5.0000 mg | INHALATION_SOLUTION | Freq: Once | RESPIRATORY_TRACT | Status: AC
  Administered 2014-05-19: 5 mg via RESPIRATORY_TRACT
  Filled 2014-05-19: qty 6

## 2014-05-19 MED ORDER — PREDNISONE 20 MG PO TABS: 60.0000 mg | ORAL_TABLET | Freq: Every day | ORAL | Status: AC

## 2014-05-19 NOTE — ED Notes (Signed)
Pt with pain with deep breath, hx of COPD; increase in frequency of urination, denies burning on urination; also with earache

## 2014-05-19 NOTE — ED Provider Notes (Signed)
CSN: [id number]     Arrival date & time 05/19/14  1335 History   First MD Initiated Contact with Patient 05/19/14 1353     Chief Complaint  Patient presents with  . Chest Pain     (Consider location/radiation/quality/duration/timing/severity/associated sxs/prior Treatment) HPI Patient presents with one week of dyspnea, worse today. Symptoms began without clear precipitant, had not improved in spite of his normal medication, including multiple inhalers. Patient denies fever, vomiting, diarrhea, abdominal pain. There is mild associated dysuria, pressure in the ears. No confusion, disorientation. Today, the patient had increasing tightness across the anterior chest. Patient does not smoke, does not drink. She endorses a history of COPD, asthma. Past Medical History  Diagnosis Date  . COPD (chronic obstructive pulmonary disease)   . Essential hypertension, benign   . Asthma   . Type 2 diabetes mellitus   . Depression    Past Surgical History  Procedure Laterality Date  . Abdominal hysterectomy    . Shoulder surgery    . Cervical fusion     Family History  Problem Relation Age of Onset  . Hypertension Mother   . Hypertension Sister    History  Substance Use Topics  . Smoking status: Former Research scientist (life sciences)  . Smokeless tobacco: Former Systems developer    Quit date: 12/17/2009  . Alcohol Use: Yes     Comment: Occasional   OB History   Grav Para Term Preterm Abortions TAB SAB Ect Mult Living                 Review of Systems  Constitutional:       Per HPI, otherwise negative  HENT:       Per HPI, otherwise negative  Respiratory:       Per HPI, otherwise negative  Cardiovascular:       Per HPI, otherwise negative  Gastrointestinal: Negative for vomiting.  Endocrine:       Negative aside from HPI  Genitourinary:       Neg aside from HPI   Musculoskeletal:       Per HPI, otherwise negative  Skin: Negative.   Neurological: Negative for syncope.      Allergies  Augmentin and  Penicillins  Home Medications   Prior to Admission medications   Medication Sig Start Date End Date Taking? Authorizing Provider  albuterol (PROVENTIL HFA;VENTOLIN HFA) 108 (90 BASE) MCG/ACT inhaler Inhale 2 puffs into the lungs every 6 (six) hours as needed for wheezing or shortness of breath.   Yes Historical Provider, MD  albuterol (PROVENTIL) (2.5 MG/3ML) 0.083% nebulizer solution Take 2.5 mg by nebulization every 8 (eight) hours as needed for wheezing or shortness of breath.    Yes Historical Provider, MD  aspirin 81 MG chewable tablet Chew 81 mg by mouth daily.   Yes Historical Provider, MD  budesonide-formoterol (SYMBICORT) 160-4.5 MCG/ACT inhaler Inhale 2 puffs into the lungs 2 (two) times daily.   Yes Historical Provider, MD  cloNIDine (CATAPRES) 0.1 MG tablet Take 1 tablet (0.1 mg total) by mouth 3 (three) times daily. 11/10/13  Yes [name], MD  FLUoxetine (PROZAC) 40 MG capsule Take 40 mg by mouth daily.   Yes Historical Provider, MD  insulin detemir (LEVEMIR) 100 UNIT/ML injection Inject 35 Units into the skin.   Yes Historical Provider, MD  metoprolol (LOPRESSOR) 100 MG tablet Take 1 tablet (100 mg total) by mouth 2 (two) times daily. 11/10/13  Yes [name], MD  montelukast (SINGULAIR) 10 MG tablet Take 10 mg by  mouth daily as needed (allergies).    Yes Historical Provider, MD  sitaGLIPtan-metformin (JANUMET) 50-1000 MG per tablet Take 1 tablet by mouth 2 (two) times daily with a meal.   Yes Historical Provider, MD  tiotropium (SPIRIVA) 18 MCG inhalation capsule Place 18 mcg into inhaler and inhale daily.   Yes Historical Provider, MD  traZODone (DESYREL) 100 MG tablet Take 100 mg by mouth daily as needed for sleep.    Yes Historical Provider, MD   BP 214/96  Pulse 76  Temp(Src) 98 F (36.7 C) (Oral)  Resp 16  Ht 5\' 7"  (1.702 m)  Wt 175 lb (79.379 kg)  BMI 27.40 kg/m2  SpO2 97% Physical Exam  Nursing note and vitals reviewed. Constitutional: She is oriented to  person, place, and time. She appears well-developed and well-nourished. No distress.  HENT:  Head: Normocephalic and atraumatic.  Both ears have slight irritation, no discharge, drainage, tympanic membrane fullness, injection.  Eyes: Conjunctivae and EOM are normal.  Cardiovascular: Normal rate and regular rhythm.   Pulmonary/Chest: Effort normal. No stridor. No respiratory distress. She has decreased breath sounds.  Abdominal: She exhibits no distension.  Musculoskeletal: She exhibits no edema.  Neurological: She is alert and oriented to person, place, and time. No cranial nerve deficit.  Skin: Skin is warm and dry.  Psychiatric: She has a normal mood and affect.    ED Course  Procedures (including critical care time) Labs Review Labs Reviewed  CBC WITH DIFFERENTIAL - Abnormal; Notable for the following:    RBC 5.23 (*)    All other components within normal limits  COMPREHENSIVE METABOLIC PANEL  URINALYSIS, ROUTINE W REFLEX MICROSCOPIC  LIPASE, BLOOD  TROPONIN I  POC URINE PREG, ED    Imaging Review No results found.   EKG Interpretation   Date/Time:  Wednesday May 19 2014 14:54:00 EDT Ventricular Rate:  72 PR Interval:  164 QRS Duration: 84 QT Interval:  414 QTC Calculation: 453 R Axis:   19 Text Interpretation:  Normal sinus rhythm Possible Anteroseptal infarct ,  age undetermined Abnormal ECG Sinus rhythm T wave abnormality No  significant change since last tracing Abnormal ekg Confirmed by Carmin Muskrat  MD [phone]) on 05/19/2014 3:07:27 PM      On repeat exam the patient is in distress.  Her blood pressure has diminished, 170/80. Labs, findings reviewed with her.   MDM   Patient presents with dyspnea, chest tightness.  Patient has history of COPD, asthma.  Patient had recent stress evaluation of her heart, and given the absence of ischemic findings, normal troponin, there is low suspicion for recurrent coronary ischemia. With improvement here,  patient was discharged in stable condition with steroids, inhaled medication.     Carmin Muskrat, MD 05/19/14 [phone]

## 2014-05-19 NOTE — ED Notes (Signed)
Pt to XR

## 2014-05-19 NOTE — Discharge Instructions (Signed)
As discussed, your evaluation today has been largely reassuring.  But, it is important that you monitor your condition carefully, and do not hesitate to return to the ED if you develop new, or concerning changes in your condition.  Otherwise, please follow-up with your physician for appropriate ongoing care.  For the next 2 days please use your inhale the medication every 4 hours.  You may then taper the medication as needed.

## 2014-06-04 ENCOUNTER — Other Ambulatory Visit: Payer: Self-pay

## 2014-06-07 DIAGNOSIS — Z720 Tobacco use: Secondary | ICD-10-CM | POA: Diagnosis not present

## 2014-06-07 DIAGNOSIS — I10 Essential (primary) hypertension: Secondary | ICD-10-CM | POA: Diagnosis not present

## 2014-06-07 DIAGNOSIS — E118 Type 2 diabetes mellitus with unspecified complications: Secondary | ICD-10-CM | POA: Diagnosis not present

## 2014-06-07 DIAGNOSIS — E876 Hypokalemia: Secondary | ICD-10-CM | POA: Diagnosis not present

## 2014-06-07 DIAGNOSIS — Z79899 Other long term (current) drug therapy: Secondary | ICD-10-CM | POA: Diagnosis not present

## 2014-06-07 DIAGNOSIS — R0781 Pleurodynia: Secondary | ICD-10-CM | POA: Diagnosis not present

## 2014-06-07 DIAGNOSIS — J441 Chronic obstructive pulmonary disease with (acute) exacerbation: Secondary | ICD-10-CM | POA: Diagnosis not present

## 2014-06-08 DIAGNOSIS — J441 Chronic obstructive pulmonary disease with (acute) exacerbation: Secondary | ICD-10-CM | POA: Diagnosis not present

## 2014-06-22 DIAGNOSIS — F321 Major depressive disorder, single episode, moderate: Secondary | ICD-10-CM | POA: Diagnosis not present

## 2014-07-18 ENCOUNTER — Other Ambulatory Visit (INDEPENDENT_AMBULATORY_CARE_PROVIDER_SITE_OTHER): Payer: Self-pay | Admitting: General Surgery

## 2014-07-18 DIAGNOSIS — C50411 Malignant neoplasm of upper-outer quadrant of right female breast: Secondary | ICD-10-CM

## 2014-07-18 NOTE — H&P (Signed)
Amy Cobb 07/09/2014 11:19 AM Location: [facility] Patient #: [id number] DOB: 06/05/1966 Married / Language: Amy Cobb / Race: White Female  History of Present Illness Stark Klein MD; 07/18/2014 2:58 PM) Patient words: eval right breast.  The patient is a 53 year old female who presents with breast cancer. Previous history [The patient is being seen for a consultation for Stage IIA ( T2, N0 and MX ) invasive ductal carcinoma (wtih DCIS, grade 1, Ki67 20%) of the right breast (upper outer quadrant). Tumor markers include estrogen receptor positive, progesterone receptor positive and HER-2/neu negative. The patient was referred by a primary care provider (Dr. Deatra Ina referred her for consultation). Initial presentation was 2 week(s) ago for breast mass on self-examination. Current diagnosis was determined by mammography, breast MRI, right ultrasound and core needle biopsy. Symptoms include skin changes in the involved breast (secondary to biopsy), while symptoms do not include breast pain, nipple discharge, fatigue, nausea, vomiting, hair loss or edema of the arm. Pertinent medical history does not include other malignancy. Risk factors include nulliparity and breast cancer in a second degree relative (maternal aunt). She is nulliparous.] The patient returns after seeing genetics and plastic surgery. She has decided to pursue nipple sparing mastectomy on the cancer side and to wait and see with the left breast. She has discussed her care as well wtih radiation oncology. She would need radiation if she is node positive. She has questions written down for [place] to discuss.    Other Problems Amy Cobb, [place]; 07/09/2014 11:19 AM) Breast Cancer  Past Surgical History Amy Cobb, [place]; 07/09/2014 11:19 AM) No pertinent past surgical history  Diagnostic Studies History Amy Cobb, CMA; 07/09/2014 11:19 AM) Colonoscopy never Mammogram within last year Pap Smear 1-5 years  ago  Allergies ([place], CMA; 07/09/2014 11:20 AM) No Known Drug Allergies11/20/2015  Medication History (Sonya Bynum, CMA; 07/09/2014 11:20 AM) Bertram Millard (28) (0.18/0.215/0.25MG-35 MCG Tablet, Oral) Active.  Social History ([place]; 07/09/2014 11:19 AM) Caffeine use Coffee, Tea. No alcohol use No drug use Tobacco use Never smoker.  Family History Amy Cobb, CMA; 07/09/2014 11:19 AM) Bleeding disorder Mother, Sister. Cerebrovascular Accident Father. Diabetes Mellitus Father. Hypertension Father, Sister. Ischemic Bowel Disease Mother. Prostate Cancer Father. Thyroid problems Mother.  Pregnancy / Birth History Amy Cobb, [place]; 07/09/2014 11:19 AM) Age at menarche 21 years. Contraceptive History Oral contraceptives. Gravida 0 Para 0 Regular periods  Review of Systems (Amy Cobb; 07/09/2014 11:19 AM) General Not Present- Appetite Loss, Chills, Fatigue, Fever, Night Sweats, Weight Gain and Weight Loss. Skin Not Present- Change in Wart/Mole, Dryness, Hives, Jaundice, New Lesions, Non-Healing Wounds, Rash and Ulcer. HEENT Present- Seasonal Allergies and Wears glasses/contact lenses. Not Present- Earache, Hearing Loss, Hoarseness, Nose Bleed, Oral Ulcers, Ringing in the Ears, Sinus Pain, Sore Throat, Visual Disturbances and Yellow Eyes. Breast Present- Breast Mass. Not Present- Breast Pain, Nipple Discharge and Skin Changes. Cardiovascular Not Present- Chest Pain, Difficulty Breathing Lying Down, Leg Cramps, Palpitations, Rapid Heart Rate, Shortness of Breath and Swelling of Extremities. Gastrointestinal Not Present- Abdominal Pain, Bloating, Bloody Stool, Change in Bowel Habits, Chronic diarrhea, Constipation, Difficulty Swallowing, Excessive gas, Gets full quickly at meals, Hemorrhoids, Indigestion, Nausea, Rectal Pain and Vomiting. Female Genitourinary Not Present- Frequency, Nocturia, Painful Urination, Pelvic Pain and  Urgency. Musculoskeletal Not Present- Back Pain, Joint Pain, Joint Stiffness, Muscle Pain, Muscle Weakness and Swelling of Extremities. Neurological Not Present- Decreased Memory, Fainting, Headaches, Numbness, Seizures, Tingling, Tremor, Trouble walking and Weakness. Psychiatric Not Present- Anxiety, Bipolar,  Change in Sleep Pattern, Depression, Fearful and Frequent crying. Endocrine Not Present- Cold Intolerance, Excessive Hunger, Hair Changes, Heat Intolerance, Hot flashes and New Diabetes. Hematology Not Present- Easy Bruising, Excessive bleeding, Gland problems, HIV and Persistent Infections.   Vitals (Sonya Bynum CMA; 07/09/2014 11:20 AM) 07/09/2014 11:19 AM Weight: 110 lb Height: 62in Body Surface Area: 1.48 m Body Mass Index: 20.12 kg/m Temp.: 97.46F(Temporal)  Pulse: 83 (Regular)  BP: 130/76 (Sitting, Left Arm, Standard)    Physical Exam Stark Klein MD; 07/18/2014 2:54 PM) General Mental Status-Alert. General Appearance-Consistent with stated age. Hydration-Well hydrated. Voice-Normal.  Head and Neck Head-normocephalic, atraumatic with no lesions or palpable masses.  Eye Sclera/Conjunctiva - Bilateral-No scleral icterus.  Chest and Lung Exam Chest and lung exam reveals -quiet, even and easy respiratory effort with no use of accessory muscles. Inspection Chest Wall - Normal. Back - normal.  Breast Note: continues to have vague density in upper outer quadrant of right breast. no adenopathy.   Cardiovascular Cardiovascular examination reveals -normal pedal pulses bilaterally. Note: regular rate and rhythm  Abdomen Inspection-Inspection Normal. Palpation/Percussion Palpation and Percussion of the abdomen reveal - Soft, Non Tender, No Rebound tenderness, No Rigidity (guarding) and No hepatosplenomegaly. Auscultation Auscultation of the abdomen reveals - Bowel sounds normal.  Peripheral Vascular Upper Extremity Inspection -  Bilateral - Normal - No Clubbing, No Cyanosis, No Edema, Pulses Intact. Lower Extremity Palpation - Edema - Bilateral - No edema.  Neurologic Neurologic evaluation reveals -alert and oriented x 3 with no impairment of recent or remote memory. Mental Status-Normal.  Musculoskeletal Global Assessment -Note: no gross deformities.  Normal Exam - Left-Upper Extremity Strength Normal and Lower Extremity Strength Normal. Normal Exam - Right-Upper Extremity Strength Normal and Lower Extremity Strength Normal.  Lymphatic Head & Neck  General Head & Neck Lymphatics: Bilateral - Description - Normal. Axillary  General Axillary Region: Bilateral - Description - Normal. Tenderness - Non Tender.    Assessment & Plan Stark Klein MD; 07/18/2014 3:01 PM) PRIMARY CANCER OF UPPER OUTER QUADRANT OF RIGHT FEMALE BREAST (174.4  C50.411) Impression: The patient will get a mastectomy on the right with nipple sparing technique and sentinel node biopsy. Genetics were negative for BRCA defect. We will hold on the second breast for now. We will determine futher care based on pathology. We discussed the procedure in detail including risks, recovery, post op restrictions, hospital stay, etc. Current Plans  Pt Education - CSS Breast Biopsy Instructions (FLB): discussed with patient and provided information. Schedule for Surgery   Signed by Stark Klein, MD (07/18/2014 3:02 PM)

## 2014-08-04 DIAGNOSIS — E1165 Type 2 diabetes mellitus with hyperglycemia: Secondary | ICD-10-CM | POA: Diagnosis not present

## 2014-08-04 DIAGNOSIS — I10 Essential (primary) hypertension: Secondary | ICD-10-CM | POA: Diagnosis not present

## 2014-08-27 DIAGNOSIS — E1165 Type 2 diabetes mellitus with hyperglycemia: Secondary | ICD-10-CM | POA: Diagnosis not present

## 2014-08-27 DIAGNOSIS — E559 Vitamin D deficiency, unspecified: Secondary | ICD-10-CM | POA: Diagnosis not present

## 2014-08-27 DIAGNOSIS — I10 Essential (primary) hypertension: Secondary | ICD-10-CM | POA: Diagnosis not present

## 2014-09-23 DIAGNOSIS — F321 Major depressive disorder, single episode, moderate: Secondary | ICD-10-CM | POA: Diagnosis not present

## 2014-10-04 ENCOUNTER — Encounter: Payer: Self-pay | Admitting: Nutrition

## 2014-10-04 ENCOUNTER — Encounter: Payer: Self-pay | Attending: "Endocrinology | Admitting: Nutrition

## 2014-10-04 VITALS — Ht 67.5 in | Wt 185.0 lb

## 2014-10-04 DIAGNOSIS — IMO0002 Reserved for concepts with insufficient information to code with codable children: Secondary | ICD-10-CM

## 2014-10-04 DIAGNOSIS — E669 Obesity, unspecified: Secondary | ICD-10-CM

## 2014-10-04 DIAGNOSIS — E1165 Type 2 diabetes mellitus with hyperglycemia: Secondary | ICD-10-CM

## 2014-10-04 DIAGNOSIS — E118 Type 2 diabetes mellitus with unspecified complications: Principal | ICD-10-CM

## 2014-10-04 NOTE — Progress Notes (Signed)
  Medical Nutrition Therapy:  Appt start time: 1500 end time:  1630.  Assessment:  Primary concerns today: Diabetes. Lives with her girlfriend. Has had DM for 5-6 years. Hfasn't met with a dietitian before. Loves to read. Lives an active lifestyle but limited physical activity due to her breathing.. Her parents didn't have DM. Has a sister with DM.   Test blood sugars twice daily.: FBS: 90-140's. Bedtime BS 200's.   Activity Has breathing issues with COPD. Being treated for Depression.  Current weight is the most she has weighed. Use to  play basket ball, rode bike and was very active and likes to walk usually but can't now due to COPD.  Blood sugars are not well controlled. Her A1C is 10.8%. Thinks she eats a lot of healthy foods but eating excessive calories and CHO leading to weight gain and elevated A1C.. Diet is inconsistent in CHO content at meals. Insuffiate physical activity. Has gained 10-15 lbs in the last 6 months or so. Compliant with medications but often is on steroids and inhalers for her COPD.  Preferred Learning Style:   Auditory  Visual  Hands on  Learning Readiness:     Ready  Change in progress   MEDICATIONS: See list.   DIETARY INTAKE:   24-hr recall:  B ( AM): Eggs, [place] sausage or oatmel and some fruit Snk ( AM): potato chips, celrery or cookies on occassionaly but doesn't eat log of junk food L ( PM): Toss salad-chicken or salmon or other veggies., water Snk ( PM):  D ( PM): 6 in sub-turkey, water Snk ( PM): none usually or 1/2 pb/jelly sandwich or crackers. Beverages: water, regular 1/2 at at time, silk milk, cranberry juice and gingerale  Usual physical activity: LImited due to COPD.  Estimated energy needs: calories 1500 g carbohydrates 170 g protein 112 g fat  Progress Towards Goal(s):  In progress.   Nutritional Diagnosis:  NB-1.1 Food and nutrition-related knowledge deficit As related to Diabetes.  As evidenced by A1C 10.8%..     Intervention:  Nutrition counseling and diabetes education on diet, meal planning, CHO Counting, portion sizes, s/s and treatment of hyper/hypoglycemia,  Target goals for blood sugars, healthy weight loss tips and risk factors for complications.  Goals:  Follow Diabetes Meal Plan as instructed  Eat 3 meals about the same time per day.  Increase fresh fruits and vegetables.  Measure foods out.  Increase water intake.  Cut out juices, sweets, cakes, cookies, desserts and sodas.  Limit carbohydrate intake to 45 grams carbohydrate/meal  Add lean protein foods to meals  Monitor glucose levels in am and before bed daily and record on BS log.  Aim for 30 mins of physical activity daily-try chair exercises to build endurance with breathing  Bring food record and glucose log to your next nutrition visit  Get A1C down to 8% in three months. 2. Lose 1lb per week.Take medications as prescribed.   Teaching Method Utilized:  Visual Auditory Hands on  Handouts given during visit include: The Plate Method Carb Counting and Food Label handouts Meal Plan Card   Barriers to learning/adherence to lifestyle change: COPD  Demonstrated degree of understanding via:  Teach Back   Monitoring/Evaluation:  Dietary intake, exercise, meal planning, SBG, and body weight in 1 month(s).

## 2014-10-06 NOTE — Patient Instructions (Signed)
Goals:  Follow Diabetes Meal Plan as instructed  Eat 3 meals about the same time per day.  Increase fresh fruits and vegetables.  Measure foods out.  Increase water intake.  Cut out juices, sweets, cakes, cookies, desserts and sodas.  Limit carbohydrate intake to 45 grams carbohydrate/meal  Add lean protein foods to meals  Monitor glucose levels in am and before bed daily and record on BS log.  Aim for 30 mins of physical activity daily-try chair exercises to build endurance with breathing  Bring food record and glucose log to your next nutrition visit  Get A1C down to 8% in three months. 2. Lose 1lb per week.Take medications as prescribed.

## 2014-11-10 DIAGNOSIS — Z7982 Long term (current) use of aspirin: Secondary | ICD-10-CM | POA: Diagnosis not present

## 2014-11-10 DIAGNOSIS — N764 Abscess of vulva: Secondary | ICD-10-CM | POA: Diagnosis not present

## 2014-11-10 DIAGNOSIS — I10 Essential (primary) hypertension: Secondary | ICD-10-CM | POA: Diagnosis not present

## 2014-11-10 DIAGNOSIS — Z825 Family history of asthma and other chronic lower respiratory diseases: Secondary | ICD-10-CM | POA: Diagnosis not present

## 2014-11-10 DIAGNOSIS — Z794 Long term (current) use of insulin: Secondary | ICD-10-CM | POA: Diagnosis not present

## 2014-11-10 DIAGNOSIS — J9811 Atelectasis: Secondary | ICD-10-CM | POA: Diagnosis not present

## 2014-11-10 DIAGNOSIS — F419 Anxiety disorder, unspecified: Secondary | ICD-10-CM | POA: Diagnosis not present

## 2014-11-10 DIAGNOSIS — Z833 Family history of diabetes mellitus: Secondary | ICD-10-CM | POA: Diagnosis not present

## 2014-11-10 DIAGNOSIS — R0602 Shortness of breath: Secondary | ICD-10-CM | POA: Diagnosis not present

## 2014-11-10 DIAGNOSIS — E119 Type 2 diabetes mellitus without complications: Secondary | ICD-10-CM | POA: Diagnosis not present

## 2014-11-10 DIAGNOSIS — Z79899 Other long term (current) drug therapy: Secondary | ICD-10-CM | POA: Diagnosis not present

## 2014-11-10 DIAGNOSIS — J45909 Unspecified asthma, uncomplicated: Secondary | ICD-10-CM | POA: Diagnosis not present

## 2014-11-10 DIAGNOSIS — Z8249 Family history of ischemic heart disease and other diseases of the circulatory system: Secondary | ICD-10-CM | POA: Diagnosis not present

## 2014-11-10 DIAGNOSIS — J449 Chronic obstructive pulmonary disease, unspecified: Secondary | ICD-10-CM | POA: Diagnosis not present

## 2014-11-12 ENCOUNTER — Ambulatory Visit: Payer: Self-pay | Admitting: Nutrition

## 2014-12-13 DIAGNOSIS — F321 Major depressive disorder, single episode, moderate: Secondary | ICD-10-CM | POA: Diagnosis not present

## 2014-12-15 ENCOUNTER — Ambulatory Visit: Payer: Self-pay | Admitting: Nutrition

## 2014-12-30 DIAGNOSIS — Z1231 Encounter for screening mammogram for malignant neoplasm of breast: Secondary | ICD-10-CM | POA: Diagnosis not present

## 2015-01-04 DIAGNOSIS — E1165 Type 2 diabetes mellitus with hyperglycemia: Secondary | ICD-10-CM | POA: Diagnosis not present

## 2015-01-04 DIAGNOSIS — J018 Other acute sinusitis: Secondary | ICD-10-CM | POA: Diagnosis not present

## 2015-01-04 DIAGNOSIS — J441 Chronic obstructive pulmonary disease with (acute) exacerbation: Secondary | ICD-10-CM | POA: Diagnosis not present

## 2015-01-05 DIAGNOSIS — E559 Vitamin D deficiency, unspecified: Secondary | ICD-10-CM | POA: Diagnosis not present

## 2015-01-05 DIAGNOSIS — E1165 Type 2 diabetes mellitus with hyperglycemia: Secondary | ICD-10-CM | POA: Diagnosis not present

## 2015-01-30 DIAGNOSIS — R079 Chest pain, unspecified: Secondary | ICD-10-CM | POA: Diagnosis not present

## 2015-01-30 DIAGNOSIS — E785 Hyperlipidemia, unspecified: Secondary | ICD-10-CM | POA: Diagnosis not present

## 2015-01-30 DIAGNOSIS — R05 Cough: Secondary | ICD-10-CM | POA: Diagnosis not present

## 2015-01-30 DIAGNOSIS — R0602 Shortness of breath: Secondary | ICD-10-CM | POA: Diagnosis not present

## 2015-01-30 DIAGNOSIS — E119 Type 2 diabetes mellitus without complications: Secondary | ICD-10-CM | POA: Diagnosis not present

## 2015-01-30 DIAGNOSIS — J441 Chronic obstructive pulmonary disease with (acute) exacerbation: Secondary | ICD-10-CM | POA: Diagnosis not present

## 2015-01-30 DIAGNOSIS — R000 Tachycardia, unspecified: Secondary | ICD-10-CM | POA: Diagnosis not present

## 2015-01-30 DIAGNOSIS — I10 Essential (primary) hypertension: Secondary | ICD-10-CM | POA: Diagnosis not present

## 2015-02-01 DIAGNOSIS — Z825 Family history of asthma and other chronic lower respiratory diseases: Secondary | ICD-10-CM | POA: Diagnosis not present

## 2015-02-01 DIAGNOSIS — R079 Chest pain, unspecified: Secondary | ICD-10-CM | POA: Diagnosis not present

## 2015-02-01 DIAGNOSIS — Z79899 Other long term (current) drug therapy: Secondary | ICD-10-CM | POA: Diagnosis not present

## 2015-02-01 DIAGNOSIS — J441 Chronic obstructive pulmonary disease with (acute) exacerbation: Secondary | ICD-10-CM | POA: Diagnosis not present

## 2015-02-01 DIAGNOSIS — Z880 Allergy status to penicillin: Secondary | ICD-10-CM | POA: Diagnosis not present

## 2015-02-01 DIAGNOSIS — E119 Type 2 diabetes mellitus without complications: Secondary | ICD-10-CM | POA: Diagnosis not present

## 2015-02-01 DIAGNOSIS — E785 Hyperlipidemia, unspecified: Secondary | ICD-10-CM | POA: Diagnosis present

## 2015-02-01 DIAGNOSIS — R000 Tachycardia, unspecified: Secondary | ICD-10-CM | POA: Diagnosis not present

## 2015-02-01 DIAGNOSIS — Z7982 Long term (current) use of aspirin: Secondary | ICD-10-CM | POA: Diagnosis not present

## 2015-02-01 DIAGNOSIS — Z8249 Family history of ischemic heart disease and other diseases of the circulatory system: Secondary | ICD-10-CM | POA: Diagnosis not present

## 2015-02-01 DIAGNOSIS — Z8379 Family history of other diseases of the digestive system: Secondary | ICD-10-CM | POA: Diagnosis not present

## 2015-02-01 DIAGNOSIS — Z7951 Long term (current) use of inhaled steroids: Secondary | ICD-10-CM | POA: Diagnosis not present

## 2015-02-01 DIAGNOSIS — Z780 Asymptomatic menopausal state: Secondary | ICD-10-CM | POA: Diagnosis not present

## 2015-02-01 DIAGNOSIS — Z87891 Personal history of nicotine dependence: Secondary | ICD-10-CM | POA: Diagnosis not present

## 2015-02-01 DIAGNOSIS — Z881 Allergy status to other antibiotic agents status: Secondary | ICD-10-CM | POA: Diagnosis not present

## 2015-02-01 DIAGNOSIS — Z888 Allergy status to other drugs, medicaments and biological substances status: Secondary | ICD-10-CM | POA: Diagnosis not present

## 2015-02-01 DIAGNOSIS — Z794 Long term (current) use of insulin: Secondary | ICD-10-CM | POA: Diagnosis not present

## 2015-02-01 DIAGNOSIS — I10 Essential (primary) hypertension: Secondary | ICD-10-CM | POA: Diagnosis not present

## 2015-02-14 ENCOUNTER — Other Ambulatory Visit: Payer: Self-pay

## 2015-02-15 DIAGNOSIS — E1165 Type 2 diabetes mellitus with hyperglycemia: Secondary | ICD-10-CM | POA: Diagnosis not present

## 2015-02-15 DIAGNOSIS — J441 Chronic obstructive pulmonary disease with (acute) exacerbation: Secondary | ICD-10-CM | POA: Diagnosis not present

## 2015-02-15 DIAGNOSIS — K210 Gastro-esophageal reflux disease with esophagitis: Secondary | ICD-10-CM | POA: Diagnosis not present

## 2015-04-04 DIAGNOSIS — F321 Major depressive disorder, single episode, moderate: Secondary | ICD-10-CM | POA: Diagnosis not present

## 2015-04-05 DIAGNOSIS — Z794 Long term (current) use of insulin: Secondary | ICD-10-CM | POA: Diagnosis not present

## 2015-04-05 DIAGNOSIS — Z881 Allergy status to other antibiotic agents status: Secondary | ICD-10-CM | POA: Diagnosis not present

## 2015-04-05 DIAGNOSIS — Z7982 Long term (current) use of aspirin: Secondary | ICD-10-CM | POA: Diagnosis not present

## 2015-04-05 DIAGNOSIS — Z780 Asymptomatic menopausal state: Secondary | ICD-10-CM | POA: Diagnosis not present

## 2015-04-05 DIAGNOSIS — R05 Cough: Secondary | ICD-10-CM | POA: Diagnosis not present

## 2015-04-05 DIAGNOSIS — J45909 Unspecified asthma, uncomplicated: Secondary | ICD-10-CM | POA: Diagnosis not present

## 2015-04-05 DIAGNOSIS — I10 Essential (primary) hypertension: Secondary | ICD-10-CM | POA: Diagnosis not present

## 2015-04-05 DIAGNOSIS — E119 Type 2 diabetes mellitus without complications: Secondary | ICD-10-CM | POA: Diagnosis not present

## 2015-04-05 DIAGNOSIS — J449 Chronic obstructive pulmonary disease, unspecified: Secondary | ICD-10-CM | POA: Diagnosis not present

## 2015-04-05 DIAGNOSIS — E785 Hyperlipidemia, unspecified: Secondary | ICD-10-CM | POA: Diagnosis not present

## 2015-04-05 DIAGNOSIS — Z8249 Family history of ischemic heart disease and other diseases of the circulatory system: Secondary | ICD-10-CM | POA: Diagnosis not present

## 2015-04-05 DIAGNOSIS — Z90710 Acquired absence of both cervix and uterus: Secondary | ICD-10-CM | POA: Diagnosis not present

## 2015-04-05 DIAGNOSIS — Z8349 Family history of other endocrine, nutritional and metabolic diseases: Secondary | ICD-10-CM | POA: Diagnosis not present

## 2015-04-05 DIAGNOSIS — J441 Chronic obstructive pulmonary disease with (acute) exacerbation: Secondary | ICD-10-CM | POA: Diagnosis not present

## 2015-04-05 DIAGNOSIS — Z79899 Other long term (current) drug therapy: Secondary | ICD-10-CM | POA: Diagnosis not present

## 2015-04-05 DIAGNOSIS — R0602 Shortness of breath: Secondary | ICD-10-CM | POA: Diagnosis not present

## 2015-04-05 DIAGNOSIS — Z7951 Long term (current) use of inhaled steroids: Secondary | ICD-10-CM | POA: Diagnosis not present

## 2015-04-27 DIAGNOSIS — I10 Essential (primary) hypertension: Secondary | ICD-10-CM | POA: Diagnosis not present

## 2015-04-27 DIAGNOSIS — E119 Type 2 diabetes mellitus without complications: Secondary | ICD-10-CM | POA: Diagnosis not present

## 2015-04-27 DIAGNOSIS — Z0000 Encounter for general adult medical examination without abnormal findings: Secondary | ICD-10-CM | POA: Diagnosis not present

## 2015-04-27 DIAGNOSIS — J441 Chronic obstructive pulmonary disease with (acute) exacerbation: Secondary | ICD-10-CM | POA: Diagnosis not present

## 2015-05-11 DIAGNOSIS — Z23 Encounter for immunization: Secondary | ICD-10-CM | POA: Diagnosis not present

## 2015-07-24 DIAGNOSIS — Z825 Family history of asthma and other chronic lower respiratory diseases: Secondary | ICD-10-CM | POA: Diagnosis not present

## 2015-07-24 DIAGNOSIS — R05 Cough: Secondary | ICD-10-CM | POA: Diagnosis not present

## 2015-07-24 DIAGNOSIS — Z8249 Family history of ischemic heart disease and other diseases of the circulatory system: Secondary | ICD-10-CM | POA: Diagnosis not present

## 2015-07-24 DIAGNOSIS — J45909 Unspecified asthma, uncomplicated: Secondary | ICD-10-CM | POA: Diagnosis not present

## 2015-07-24 DIAGNOSIS — Z79899 Other long term (current) drug therapy: Secondary | ICD-10-CM | POA: Diagnosis not present

## 2015-07-24 DIAGNOSIS — Z7982 Long term (current) use of aspirin: Secondary | ICD-10-CM | POA: Diagnosis not present

## 2015-07-24 DIAGNOSIS — Z794 Long term (current) use of insulin: Secondary | ICD-10-CM | POA: Diagnosis not present

## 2015-07-24 DIAGNOSIS — J441 Chronic obstructive pulmonary disease with (acute) exacerbation: Secondary | ICD-10-CM | POA: Diagnosis not present

## 2015-07-24 DIAGNOSIS — E119 Type 2 diabetes mellitus without complications: Secondary | ICD-10-CM | POA: Diagnosis not present

## 2015-07-24 DIAGNOSIS — J069 Acute upper respiratory infection, unspecified: Secondary | ICD-10-CM | POA: Diagnosis not present

## 2015-07-24 DIAGNOSIS — I10 Essential (primary) hypertension: Secondary | ICD-10-CM | POA: Diagnosis not present

## 2015-07-24 DIAGNOSIS — R0602 Shortness of breath: Secondary | ICD-10-CM | POA: Diagnosis not present

## 2015-07-24 DIAGNOSIS — Z833 Family history of diabetes mellitus: Secondary | ICD-10-CM | POA: Diagnosis not present

## 2015-07-25 DIAGNOSIS — F321 Major depressive disorder, single episode, moderate: Secondary | ICD-10-CM | POA: Diagnosis not present

## 2015-07-28 DIAGNOSIS — I10 Essential (primary) hypertension: Secondary | ICD-10-CM | POA: Diagnosis not present

## 2015-07-28 DIAGNOSIS — J441 Chronic obstructive pulmonary disease with (acute) exacerbation: Secondary | ICD-10-CM | POA: Diagnosis not present

## 2015-07-28 DIAGNOSIS — E119 Type 2 diabetes mellitus without complications: Secondary | ICD-10-CM | POA: Diagnosis not present

## 2015-08-16 DIAGNOSIS — I10 Essential (primary) hypertension: Secondary | ICD-10-CM | POA: Diagnosis not present

## 2015-08-16 DIAGNOSIS — E119 Type 2 diabetes mellitus without complications: Secondary | ICD-10-CM | POA: Diagnosis not present

## 2015-08-16 DIAGNOSIS — J45901 Unspecified asthma with (acute) exacerbation: Secondary | ICD-10-CM | POA: Diagnosis not present

## 2015-08-16 DIAGNOSIS — R0602 Shortness of breath: Secondary | ICD-10-CM | POA: Diagnosis not present

## 2015-08-16 DIAGNOSIS — R05 Cough: Secondary | ICD-10-CM | POA: Diagnosis not present

## 2015-08-16 DIAGNOSIS — Z7982 Long term (current) use of aspirin: Secondary | ICD-10-CM | POA: Diagnosis not present

## 2015-08-16 DIAGNOSIS — Z794 Long term (current) use of insulin: Secondary | ICD-10-CM | POA: Diagnosis not present

## 2015-08-16 DIAGNOSIS — Z7951 Long term (current) use of inhaled steroids: Secondary | ICD-10-CM | POA: Diagnosis not present

## 2015-08-16 DIAGNOSIS — J449 Chronic obstructive pulmonary disease, unspecified: Secondary | ICD-10-CM | POA: Diagnosis not present

## 2015-08-16 DIAGNOSIS — Z79899 Other long term (current) drug therapy: Secondary | ICD-10-CM | POA: Diagnosis not present

## 2015-08-18 DIAGNOSIS — M79673 Pain in unspecified foot: Secondary | ICD-10-CM | POA: Diagnosis not present

## 2015-08-18 DIAGNOSIS — G5791 Unspecified mononeuropathy of right lower limb: Secondary | ICD-10-CM | POA: Diagnosis not present

## 2015-08-18 DIAGNOSIS — E1142 Type 2 diabetes mellitus with diabetic polyneuropathy: Secondary | ICD-10-CM | POA: Diagnosis not present

## 2015-08-18 DIAGNOSIS — G5792 Unspecified mononeuropathy of left lower limb: Secondary | ICD-10-CM | POA: Diagnosis not present

## 2015-10-07 DIAGNOSIS — J018 Other acute sinusitis: Secondary | ICD-10-CM | POA: Diagnosis not present

## 2015-10-07 DIAGNOSIS — R5383 Other fatigue: Secondary | ICD-10-CM | POA: Diagnosis not present

## 2015-10-10 DIAGNOSIS — I10 Essential (primary) hypertension: Secondary | ICD-10-CM | POA: Diagnosis not present

## 2015-10-10 DIAGNOSIS — M109 Gout, unspecified: Secondary | ICD-10-CM | POA: Diagnosis not present

## 2015-10-10 DIAGNOSIS — M7989 Other specified soft tissue disorders: Secondary | ICD-10-CM | POA: Diagnosis not present

## 2015-10-10 DIAGNOSIS — E119 Type 2 diabetes mellitus without complications: Secondary | ICD-10-CM | POA: Diagnosis not present

## 2015-10-24 DIAGNOSIS — F321 Major depressive disorder, single episode, moderate: Secondary | ICD-10-CM | POA: Diagnosis not present

## 2015-10-25 DIAGNOSIS — B351 Tinea unguium: Secondary | ICD-10-CM | POA: Diagnosis not present

## 2015-10-25 DIAGNOSIS — L84 Corns and callosities: Secondary | ICD-10-CM | POA: Diagnosis not present

## 2015-10-25 DIAGNOSIS — E1142 Type 2 diabetes mellitus with diabetic polyneuropathy: Secondary | ICD-10-CM | POA: Diagnosis not present

## 2015-11-01 DIAGNOSIS — Z79899 Other long term (current) drug therapy: Secondary | ICD-10-CM | POA: Diagnosis not present

## 2015-11-01 DIAGNOSIS — I10 Essential (primary) hypertension: Secondary | ICD-10-CM | POA: Diagnosis not present

## 2015-11-01 DIAGNOSIS — G4730 Sleep apnea, unspecified: Secondary | ICD-10-CM | POA: Diagnosis not present

## 2015-11-01 DIAGNOSIS — E119 Type 2 diabetes mellitus without complications: Secondary | ICD-10-CM | POA: Diagnosis not present

## 2015-11-01 DIAGNOSIS — J441 Chronic obstructive pulmonary disease with (acute) exacerbation: Secondary | ICD-10-CM | POA: Diagnosis not present

## 2015-11-03 DIAGNOSIS — G4730 Sleep apnea, unspecified: Secondary | ICD-10-CM | POA: Diagnosis not present

## 2015-11-28 DIAGNOSIS — G4733 Obstructive sleep apnea (adult) (pediatric): Secondary | ICD-10-CM | POA: Diagnosis not present

## 2016-01-02 DIAGNOSIS — Z1231 Encounter for screening mammogram for malignant neoplasm of breast: Secondary | ICD-10-CM | POA: Diagnosis not present

## 2016-01-03 DIAGNOSIS — E1142 Type 2 diabetes mellitus with diabetic polyneuropathy: Secondary | ICD-10-CM | POA: Diagnosis not present

## 2016-01-03 DIAGNOSIS — L84 Corns and callosities: Secondary | ICD-10-CM | POA: Diagnosis not present

## 2016-01-03 DIAGNOSIS — B351 Tinea unguium: Secondary | ICD-10-CM | POA: Diagnosis not present

## 2016-01-23 DIAGNOSIS — F321 Major depressive disorder, single episode, moderate: Secondary | ICD-10-CM | POA: Diagnosis not present

## 2016-02-02 DIAGNOSIS — J440 Chronic obstructive pulmonary disease with acute lower respiratory infection: Secondary | ICD-10-CM | POA: Diagnosis not present

## 2016-02-02 DIAGNOSIS — J3089 Other allergic rhinitis: Secondary | ICD-10-CM | POA: Diagnosis not present

## 2016-02-02 DIAGNOSIS — G4739 Other sleep apnea: Secondary | ICD-10-CM | POA: Diagnosis not present

## 2016-02-02 DIAGNOSIS — E1165 Type 2 diabetes mellitus with hyperglycemia: Secondary | ICD-10-CM | POA: Diagnosis not present

## 2016-02-03 DIAGNOSIS — E1165 Type 2 diabetes mellitus with hyperglycemia: Secondary | ICD-10-CM | POA: Diagnosis not present

## 2016-02-03 DIAGNOSIS — G4739 Other sleep apnea: Secondary | ICD-10-CM | POA: Diagnosis not present

## 2016-02-03 DIAGNOSIS — J441 Chronic obstructive pulmonary disease with (acute) exacerbation: Secondary | ICD-10-CM | POA: Diagnosis not present

## 2016-02-03 DIAGNOSIS — I10 Essential (primary) hypertension: Secondary | ICD-10-CM | POA: Diagnosis not present

## 2016-03-06 DIAGNOSIS — J441 Chronic obstructive pulmonary disease with (acute) exacerbation: Secondary | ICD-10-CM | POA: Diagnosis not present

## 2016-03-06 DIAGNOSIS — E1165 Type 2 diabetes mellitus with hyperglycemia: Secondary | ICD-10-CM | POA: Diagnosis not present

## 2016-03-06 DIAGNOSIS — G4739 Other sleep apnea: Secondary | ICD-10-CM | POA: Diagnosis not present

## 2016-03-06 DIAGNOSIS — I10 Essential (primary) hypertension: Secondary | ICD-10-CM | POA: Diagnosis not present

## 2016-03-12 DIAGNOSIS — R0602 Shortness of breath: Secondary | ICD-10-CM | POA: Diagnosis not present

## 2016-03-12 DIAGNOSIS — R0981 Nasal congestion: Secondary | ICD-10-CM | POA: Diagnosis not present

## 2016-03-12 DIAGNOSIS — J441 Chronic obstructive pulmonary disease with (acute) exacerbation: Secondary | ICD-10-CM | POA: Diagnosis not present

## 2016-03-12 DIAGNOSIS — H9201 Otalgia, right ear: Secondary | ICD-10-CM | POA: Diagnosis not present

## 2016-03-12 DIAGNOSIS — I10 Essential (primary) hypertension: Secondary | ICD-10-CM | POA: Diagnosis not present

## 2016-03-12 DIAGNOSIS — Z7982 Long term (current) use of aspirin: Secondary | ICD-10-CM | POA: Diagnosis not present

## 2016-03-12 DIAGNOSIS — Z794 Long term (current) use of insulin: Secondary | ICD-10-CM | POA: Diagnosis not present

## 2016-03-12 DIAGNOSIS — Z8249 Family history of ischemic heart disease and other diseases of the circulatory system: Secondary | ICD-10-CM | POA: Diagnosis not present

## 2016-03-12 DIAGNOSIS — Z825 Family history of asthma and other chronic lower respiratory diseases: Secondary | ICD-10-CM | POA: Diagnosis not present

## 2016-03-12 DIAGNOSIS — E119 Type 2 diabetes mellitus without complications: Secondary | ICD-10-CM | POA: Diagnosis not present

## 2016-03-12 DIAGNOSIS — Z833 Family history of diabetes mellitus: Secondary | ICD-10-CM | POA: Diagnosis not present

## 2016-03-12 DIAGNOSIS — Z79899 Other long term (current) drug therapy: Secondary | ICD-10-CM | POA: Diagnosis not present

## 2016-03-20 DIAGNOSIS — E1142 Type 2 diabetes mellitus with diabetic polyneuropathy: Secondary | ICD-10-CM | POA: Diagnosis not present

## 2016-03-20 DIAGNOSIS — L84 Corns and callosities: Secondary | ICD-10-CM | POA: Diagnosis not present

## 2016-03-20 DIAGNOSIS — B351 Tinea unguium: Secondary | ICD-10-CM | POA: Diagnosis not present

## 2016-03-22 DIAGNOSIS — G4739 Other sleep apnea: Secondary | ICD-10-CM | POA: Diagnosis not present

## 2016-03-22 DIAGNOSIS — J441 Chronic obstructive pulmonary disease with (acute) exacerbation: Secondary | ICD-10-CM | POA: Diagnosis not present

## 2016-03-22 DIAGNOSIS — I10 Essential (primary) hypertension: Secondary | ICD-10-CM | POA: Diagnosis not present

## 2016-03-22 DIAGNOSIS — E1165 Type 2 diabetes mellitus with hyperglycemia: Secondary | ICD-10-CM | POA: Diagnosis not present

## 2016-04-12 ENCOUNTER — Ambulatory Visit (INDEPENDENT_AMBULATORY_CARE_PROVIDER_SITE_OTHER): Payer: Medicare Other | Admitting: Otolaryngology

## 2016-04-12 DIAGNOSIS — J310 Chronic rhinitis: Secondary | ICD-10-CM

## 2016-04-12 DIAGNOSIS — H9209 Otalgia, unspecified ear: Secondary | ICD-10-CM | POA: Diagnosis not present

## 2016-04-12 DIAGNOSIS — R070 Pain in throat: Secondary | ICD-10-CM

## 2016-04-12 DIAGNOSIS — J343 Hypertrophy of nasal turbinates: Secondary | ICD-10-CM

## 2016-04-16 DIAGNOSIS — F321 Major depressive disorder, single episode, moderate: Secondary | ICD-10-CM | POA: Diagnosis not present

## 2016-04-24 DIAGNOSIS — G4739 Other sleep apnea: Secondary | ICD-10-CM | POA: Diagnosis not present

## 2016-04-24 DIAGNOSIS — J441 Chronic obstructive pulmonary disease with (acute) exacerbation: Secondary | ICD-10-CM | POA: Diagnosis not present

## 2016-04-24 DIAGNOSIS — E1165 Type 2 diabetes mellitus with hyperglycemia: Secondary | ICD-10-CM | POA: Diagnosis not present

## 2016-04-24 DIAGNOSIS — I10 Essential (primary) hypertension: Secondary | ICD-10-CM | POA: Diagnosis not present

## 2016-05-07 DIAGNOSIS — J440 Chronic obstructive pulmonary disease with acute lower respiratory infection: Secondary | ICD-10-CM | POA: Diagnosis not present

## 2016-05-07 DIAGNOSIS — G4739 Other sleep apnea: Secondary | ICD-10-CM | POA: Diagnosis not present

## 2016-05-07 DIAGNOSIS — I208 Other forms of angina pectoris: Secondary | ICD-10-CM | POA: Diagnosis not present

## 2016-05-07 DIAGNOSIS — Z1389 Encounter for screening for other disorder: Secondary | ICD-10-CM | POA: Diagnosis not present

## 2016-05-07 DIAGNOSIS — E1165 Type 2 diabetes mellitus with hyperglycemia: Secondary | ICD-10-CM | POA: Diagnosis not present

## 2016-05-07 DIAGNOSIS — Z0000 Encounter for general adult medical examination without abnormal findings: Secondary | ICD-10-CM | POA: Diagnosis not present

## 2016-05-11 DIAGNOSIS — I209 Angina pectoris, unspecified: Secondary | ICD-10-CM | POA: Diagnosis not present

## 2016-05-11 DIAGNOSIS — R931 Abnormal findings on diagnostic imaging of heart and coronary circulation: Secondary | ICD-10-CM | POA: Diagnosis not present

## 2016-05-11 DIAGNOSIS — R079 Chest pain, unspecified: Secondary | ICD-10-CM | POA: Diagnosis not present

## 2016-06-11 ENCOUNTER — Ambulatory Visit (INDEPENDENT_AMBULATORY_CARE_PROVIDER_SITE_OTHER): Payer: Medicare Other | Admitting: Otolaryngology

## 2016-06-11 DIAGNOSIS — H93293 Other abnormal auditory perceptions, bilateral: Secondary | ICD-10-CM

## 2016-06-11 DIAGNOSIS — H9209 Otalgia, unspecified ear: Secondary | ICD-10-CM | POA: Diagnosis not present

## 2016-06-12 DIAGNOSIS — B351 Tinea unguium: Secondary | ICD-10-CM | POA: Diagnosis not present

## 2016-06-12 DIAGNOSIS — E1142 Type 2 diabetes mellitus with diabetic polyneuropathy: Secondary | ICD-10-CM | POA: Diagnosis not present

## 2016-06-12 DIAGNOSIS — L84 Corns and callosities: Secondary | ICD-10-CM | POA: Diagnosis not present

## 2016-06-25 DIAGNOSIS — J440 Chronic obstructive pulmonary disease with acute lower respiratory infection: Secondary | ICD-10-CM | POA: Diagnosis not present

## 2016-06-25 DIAGNOSIS — I208 Other forms of angina pectoris: Secondary | ICD-10-CM | POA: Diagnosis not present

## 2016-06-25 DIAGNOSIS — G4739 Other sleep apnea: Secondary | ICD-10-CM | POA: Diagnosis not present

## 2016-06-25 DIAGNOSIS — E1165 Type 2 diabetes mellitus with hyperglycemia: Secondary | ICD-10-CM | POA: Diagnosis not present

## 2016-07-02 DIAGNOSIS — E113293 Type 2 diabetes mellitus with mild nonproliferative diabetic retinopathy without macular edema, bilateral: Secondary | ICD-10-CM | POA: Diagnosis not present

## 2016-07-05 DIAGNOSIS — F321 Major depressive disorder, single episode, moderate: Secondary | ICD-10-CM | POA: Diagnosis not present

## 2016-07-20 DIAGNOSIS — I208 Other forms of angina pectoris: Secondary | ICD-10-CM | POA: Diagnosis not present

## 2016-07-20 DIAGNOSIS — J440 Chronic obstructive pulmonary disease with acute lower respiratory infection: Secondary | ICD-10-CM | POA: Diagnosis not present

## 2016-07-20 DIAGNOSIS — G4739 Other sleep apnea: Secondary | ICD-10-CM | POA: Diagnosis not present

## 2016-07-20 DIAGNOSIS — E1165 Type 2 diabetes mellitus with hyperglycemia: Secondary | ICD-10-CM | POA: Diagnosis not present

## 2016-08-09 DIAGNOSIS — J440 Chronic obstructive pulmonary disease with acute lower respiratory infection: Secondary | ICD-10-CM | POA: Diagnosis not present

## 2016-08-09 DIAGNOSIS — G4739 Other sleep apnea: Secondary | ICD-10-CM | POA: Diagnosis not present

## 2016-08-09 DIAGNOSIS — E1165 Type 2 diabetes mellitus with hyperglycemia: Secondary | ICD-10-CM | POA: Diagnosis not present

## 2016-08-09 DIAGNOSIS — Z79899 Other long term (current) drug therapy: Secondary | ICD-10-CM | POA: Diagnosis not present

## 2016-08-30 DIAGNOSIS — G4739 Other sleep apnea: Secondary | ICD-10-CM | POA: Diagnosis not present

## 2016-08-30 DIAGNOSIS — E1165 Type 2 diabetes mellitus with hyperglycemia: Secondary | ICD-10-CM | POA: Diagnosis not present

## 2016-08-30 DIAGNOSIS — J440 Chronic obstructive pulmonary disease with acute lower respiratory infection: Secondary | ICD-10-CM | POA: Diagnosis not present

## 2016-09-25 DIAGNOSIS — B351 Tinea unguium: Secondary | ICD-10-CM | POA: Diagnosis not present

## 2016-09-25 DIAGNOSIS — L84 Corns and callosities: Secondary | ICD-10-CM | POA: Diagnosis not present

## 2016-09-25 DIAGNOSIS — E1142 Type 2 diabetes mellitus with diabetic polyneuropathy: Secondary | ICD-10-CM | POA: Diagnosis not present

## 2016-09-27 DIAGNOSIS — F321 Major depressive disorder, single episode, moderate: Secondary | ICD-10-CM | POA: Diagnosis not present

## 2016-10-30 DIAGNOSIS — Z87891 Personal history of nicotine dependence: Secondary | ICD-10-CM | POA: Diagnosis not present

## 2016-10-30 DIAGNOSIS — I160 Hypertensive urgency: Secondary | ICD-10-CM | POA: Diagnosis not present

## 2016-10-30 DIAGNOSIS — J441 Chronic obstructive pulmonary disease with (acute) exacerbation: Secondary | ICD-10-CM | POA: Diagnosis not present

## 2016-10-30 DIAGNOSIS — I10 Essential (primary) hypertension: Secondary | ICD-10-CM | POA: Diagnosis not present

## 2016-10-30 DIAGNOSIS — R05 Cough: Secondary | ICD-10-CM | POA: Diagnosis not present

## 2016-10-30 DIAGNOSIS — J449 Chronic obstructive pulmonary disease, unspecified: Secondary | ICD-10-CM | POA: Diagnosis not present

## 2016-10-30 DIAGNOSIS — R0602 Shortness of breath: Secondary | ICD-10-CM | POA: Diagnosis not present

## 2016-10-31 DIAGNOSIS — I160 Hypertensive urgency: Secondary | ICD-10-CM | POA: Diagnosis not present

## 2016-10-31 DIAGNOSIS — J441 Chronic obstructive pulmonary disease with (acute) exacerbation: Secondary | ICD-10-CM | POA: Diagnosis not present

## 2016-11-01 DIAGNOSIS — I160 Hypertensive urgency: Secondary | ICD-10-CM | POA: Diagnosis not present

## 2016-11-01 DIAGNOSIS — J441 Chronic obstructive pulmonary disease with (acute) exacerbation: Secondary | ICD-10-CM | POA: Diagnosis not present

## 2016-11-22 DIAGNOSIS — F339 Major depressive disorder, recurrent, unspecified: Secondary | ICD-10-CM | POA: Diagnosis not present

## 2016-11-27 DIAGNOSIS — Z6830 Body mass index (BMI) 30.0-30.9, adult: Secondary | ICD-10-CM | POA: Diagnosis not present

## 2016-11-27 DIAGNOSIS — E1165 Type 2 diabetes mellitus with hyperglycemia: Secondary | ICD-10-CM | POA: Diagnosis not present

## 2016-11-27 DIAGNOSIS — J441 Chronic obstructive pulmonary disease with (acute) exacerbation: Secondary | ICD-10-CM | POA: Diagnosis not present

## 2016-11-27 DIAGNOSIS — I160 Hypertensive urgency: Secondary | ICD-10-CM | POA: Diagnosis not present

## 2016-12-04 DIAGNOSIS — M79671 Pain in right foot: Secondary | ICD-10-CM | POA: Diagnosis not present

## 2016-12-04 DIAGNOSIS — M76821 Posterior tibial tendinitis, right leg: Secondary | ICD-10-CM | POA: Diagnosis not present

## 2016-12-18 DIAGNOSIS — M76821 Posterior tibial tendinitis, right leg: Secondary | ICD-10-CM | POA: Diagnosis not present

## 2016-12-18 DIAGNOSIS — M79671 Pain in right foot: Secondary | ICD-10-CM | POA: Diagnosis not present

## 2016-12-25 ENCOUNTER — Ambulatory Visit (INDEPENDENT_AMBULATORY_CARE_PROVIDER_SITE_OTHER): Payer: Commercial Managed Care - HMO | Admitting: Obstetrics & Gynecology

## 2016-12-25 ENCOUNTER — Encounter: Payer: Self-pay | Admitting: Obstetrics & Gynecology

## 2016-12-25 VITALS — BP 148/92 | Ht 67.0 in | Wt 196.0 lb

## 2016-12-25 DIAGNOSIS — E669 Obesity, unspecified: Secondary | ICD-10-CM | POA: Diagnosis not present

## 2016-12-25 DIAGNOSIS — Z780 Asymptomatic menopausal state: Secondary | ICD-10-CM

## 2016-12-25 DIAGNOSIS — Z1272 Encounter for screening for malignant neoplasm of vagina: Secondary | ICD-10-CM

## 2016-12-25 DIAGNOSIS — D071 Carcinoma in situ of vulva: Secondary | ICD-10-CM

## 2016-12-25 DIAGNOSIS — Z01411 Encounter for gynecological examination (general) (routine) with abnormal findings: Secondary | ICD-10-CM | POA: Diagnosis not present

## 2016-12-25 NOTE — Progress Notes (Signed)
Amy Cobb [birth year] [id number]   History:    56 y.o.  G0  Same sex stable relationship.  RP:  New patient presenting for annual gyn exam   S/P TAH for Fibroids in early 2000's.  C/O hotflashes/night sweats and insomnia.  H/O CIS of Rt Vulva treated by CO2 laser in 2003.  Patient has not seen any change or lesion on vulva with self examination.  No pelvic pain.  Normal vaginal secretions.  Breasts wnl.  No UTI Sx.  BMs normal.  Past medical history,surgical history, family history and social history were all reviewed and documented in the EPIC chart.  Gynecologic History No LMP recorded. Patient has had a hysterectomy. Contraception: status post hysterectomy Last Pap: 2016. Results were: normal per patient Last mammogram: 12/2015. Results were: normal  Obstetric History OB History  Gravida Para Term Preterm AB Living  0 0 0 0 0 0  SAB TAB Ectopic Multiple Live Births  0 0 0 0 0         ROS: A ROS was performed and pertinent positives and negatives are included in the history.  GENERAL: No fevers or chills. HEENT: No change in vision, no earache, sore throat or sinus congestion. NECK: No pain or stiffness. CARDIOVASCULAR: No chest pain or pressure. No palpitations. PULMONARY: No shortness of breath, cough or wheeze. GASTROINTESTINAL: No abdominal pain, nausea, vomiting or diarrhea, melena or bright red blood per rectum. GENITOURINARY: No urinary frequency, urgency, hesitancy or dysuria. MUSCULOSKELETAL: No joint or muscle pain, no back pain, no recent trauma. DERMATOLOGIC: No rash, no itching, no lesions. ENDOCRINE: No polyuria, polydipsia, no heat or cold intolerance. No recent change in weight. HEMATOLOGICAL: No anemia or easy bruising or bleeding. NEUROLOGIC: No headache, seizures, numbness, tingling or weakness. PSYCHIATRIC: No depression, no loss of interest in normal activity or change in sleep pattern.     Exam:   BP (!) 148/92   Ht 5\' 7"  (1.702 m)   Wt 196 lb  (88.9 kg)   BMI 30.70 kg/m   Body mass index is 30.7 kg/m.  General appearance : Well developed well nourished female. No acute distress HEENT: Eyes: no retinal hemorrhage or exudates,  Neck supple, trachea midline, no carotid bruits, no thyroidmegaly Lungs: Clear to auscultation, no rhonchi or wheezes, or rib retractions  Heart: Regular rate and rhythm, no murmurs or gallops Breast:Examined in sitting and supine position were symmetrical in appearance, no palpable masses or tenderness,  no skin retraction, no nipple inversion, no nipple discharge, no skin discoloration, no axillary or supraclavicular lymphadenopathy Abdomen: no palpable masses or tenderness, no rebound or guarding Extremities: no edema or skin discoloration or tenderness  Pelvic: Vulva:  Rt vulva scarred from laser treatment.  No raised or flat lesion.               Patchy depigmentation (unchanged per patient x many years).  Bartholin, Urethra, Skene Glands: Within normal limits             Vagina: No gross lesions or discharge.  Pap reflex done.  Uterus/Cervix Surgically absent.   Adnexa  Without masses or tenderness  Anus and perineum  normal   Physical Exam  Genitourinary:       Assessment/Plan:  56 y.o. female for annual exam   1. Encounter for gynecological examination with abnormal finding Gyn exam with Pap reflex done.  S/P TAH.  H/O CIS Rt Vulva treated with CO2 Laser in 2003.  2. Menopause Hot flashes/night  sweats and insomnia.  Will confirm with Amy Cobb given S/P TAH.  If Menopause confirmed, f/u discuss HRT and BD. - [facility] - DG Bone Density; Future  3. Carcinoma in situ of vulva  Rt Vulvar CIS treated with CO2 Laser in 2003 by Amy Cobb.  No evidence of recurrence on exam today.  Patient will continue self examination and come for evaluation with me if any change.  4. Obesity BMI 30.7 Low carb diet and Physical activity reviewed.  Patient with many comorbidities/cardiovascular risk factors.   Followed by Amy Cobb and Cardiologist.   Counseling on above issues >50% x 20 minutes.  Amy Bruins Cobb, 10:20 AM 12/25/2016

## 2016-12-25 NOTE — Patient Instructions (Signed)
  1. Encounter for gynecological examination with abnormal finding Gyn exam with Pap reflex done.  S/P TAH.  H/O CIS Rt Vulva treated with CO2 Laser in 2003.  2. Menopause Hot flashes/night sweats and insomnia.  Will confirm with [place] given S/P TAH.  If Menopause confirmed, f/u discuss HRT and BD. - [facility] - DG Bone Density; Future  3. Carcinoma in situ of vulva  Rt Vulvar CIS treated with CO2 Laser in 2003 by [name].  No evidence of recurrence on exam today.  Patient will continue self examination and come for evaluation with me if any change.  4. Obesity BMI 30.7 Low carb diet and Physical activity reviewed.  Patient with many comorbidities/cardiovascular risk factors.  Followed by [name] MD and Cardiologist.  Seth Bake, it was a pleasure to meet you today!  I will inform you of your Pap and [facility] results as soon as available.

## 2016-12-26 LAB — FOLLICLE STIMULATING HORMONE: FSH: 47.3 m[IU]/mL

## 2016-12-27 ENCOUNTER — Other Ambulatory Visit: Payer: Self-pay | Admitting: Gynecology

## 2016-12-27 DIAGNOSIS — Z780 Asymptomatic menopausal state: Secondary | ICD-10-CM

## 2016-12-27 DIAGNOSIS — Z13820 Encounter for screening for osteoporosis: Secondary | ICD-10-CM

## 2016-12-27 LAB — PAP IG W/ RFLX HPV ASCU

## 2016-12-28 DIAGNOSIS — G4739 Other sleep apnea: Secondary | ICD-10-CM | POA: Diagnosis not present

## 2016-12-28 DIAGNOSIS — I10 Essential (primary) hypertension: Secondary | ICD-10-CM | POA: Diagnosis not present

## 2016-12-28 DIAGNOSIS — J441 Chronic obstructive pulmonary disease with (acute) exacerbation: Secondary | ICD-10-CM | POA: Diagnosis not present

## 2016-12-28 DIAGNOSIS — E1165 Type 2 diabetes mellitus with hyperglycemia: Secondary | ICD-10-CM | POA: Diagnosis not present

## 2017-01-01 ENCOUNTER — Ambulatory Visit (INDEPENDENT_AMBULATORY_CARE_PROVIDER_SITE_OTHER): Payer: Medicare HMO

## 2017-01-01 DIAGNOSIS — Z780 Asymptomatic menopausal state: Secondary | ICD-10-CM | POA: Diagnosis not present

## 2017-01-01 DIAGNOSIS — Z13820 Encounter for screening for osteoporosis: Secondary | ICD-10-CM

## 2017-01-02 ENCOUNTER — Encounter: Payer: Self-pay | Admitting: Gynecology

## 2017-01-03 DIAGNOSIS — Z1231 Encounter for screening mammogram for malignant neoplasm of breast: Secondary | ICD-10-CM | POA: Diagnosis not present

## 2017-01-23 DIAGNOSIS — Z1211 Encounter for screening for malignant neoplasm of colon: Secondary | ICD-10-CM | POA: Diagnosis not present

## 2017-01-24 DIAGNOSIS — I10 Essential (primary) hypertension: Secondary | ICD-10-CM | POA: Diagnosis not present

## 2017-01-24 DIAGNOSIS — R110 Nausea: Secondary | ICD-10-CM | POA: Diagnosis not present

## 2017-01-24 DIAGNOSIS — E119 Type 2 diabetes mellitus without complications: Secondary | ICD-10-CM | POA: Diagnosis not present

## 2017-01-24 DIAGNOSIS — Z7982 Long term (current) use of aspirin: Secondary | ICD-10-CM | POA: Diagnosis not present

## 2017-01-24 DIAGNOSIS — J449 Chronic obstructive pulmonary disease, unspecified: Secondary | ICD-10-CM | POA: Diagnosis not present

## 2017-01-24 DIAGNOSIS — H9201 Otalgia, right ear: Secondary | ICD-10-CM | POA: Diagnosis not present

## 2017-01-24 DIAGNOSIS — R05 Cough: Secondary | ICD-10-CM | POA: Diagnosis not present

## 2017-01-24 DIAGNOSIS — Z87891 Personal history of nicotine dependence: Secondary | ICD-10-CM | POA: Diagnosis not present

## 2017-01-24 DIAGNOSIS — Z794 Long term (current) use of insulin: Secondary | ICD-10-CM | POA: Diagnosis not present

## 2017-01-24 DIAGNOSIS — Z79899 Other long term (current) drug therapy: Secondary | ICD-10-CM | POA: Diagnosis not present

## 2017-01-24 DIAGNOSIS — R51 Headache: Secondary | ICD-10-CM | POA: Diagnosis not present

## 2017-02-11 DIAGNOSIS — F419 Anxiety disorder, unspecified: Secondary | ICD-10-CM | POA: Insufficient documentation

## 2017-02-11 DIAGNOSIS — F339 Major depressive disorder, recurrent, unspecified: Secondary | ICD-10-CM | POA: Insufficient documentation

## 2017-03-05 DIAGNOSIS — K219 Gastro-esophageal reflux disease without esophagitis: Secondary | ICD-10-CM | POA: Diagnosis not present

## 2017-03-05 DIAGNOSIS — E1165 Type 2 diabetes mellitus with hyperglycemia: Secondary | ICD-10-CM | POA: Diagnosis not present

## 2017-03-05 DIAGNOSIS — J441 Chronic obstructive pulmonary disease with (acute) exacerbation: Secondary | ICD-10-CM | POA: Diagnosis not present

## 2017-03-07 DIAGNOSIS — Z6829 Body mass index (BMI) 29.0-29.9, adult: Secondary | ICD-10-CM | POA: Diagnosis not present

## 2017-03-07 DIAGNOSIS — J441 Chronic obstructive pulmonary disease with (acute) exacerbation: Secondary | ICD-10-CM | POA: Diagnosis not present

## 2017-03-07 DIAGNOSIS — I10 Essential (primary) hypertension: Secondary | ICD-10-CM | POA: Diagnosis not present

## 2017-03-07 DIAGNOSIS — K219 Gastro-esophageal reflux disease without esophagitis: Secondary | ICD-10-CM | POA: Diagnosis not present

## 2017-03-07 DIAGNOSIS — E1165 Type 2 diabetes mellitus with hyperglycemia: Secondary | ICD-10-CM | POA: Diagnosis not present

## 2017-03-21 DIAGNOSIS — Z881 Allergy status to other antibiotic agents status: Secondary | ICD-10-CM | POA: Diagnosis not present

## 2017-03-21 DIAGNOSIS — E119 Type 2 diabetes mellitus without complications: Secondary | ICD-10-CM | POA: Diagnosis not present

## 2017-03-21 DIAGNOSIS — Z880 Allergy status to penicillin: Secondary | ICD-10-CM | POA: Diagnosis not present

## 2017-03-21 DIAGNOSIS — F329 Major depressive disorder, single episode, unspecified: Secondary | ICD-10-CM | POA: Diagnosis not present

## 2017-03-21 DIAGNOSIS — G4730 Sleep apnea, unspecified: Secondary | ICD-10-CM | POA: Diagnosis not present

## 2017-03-21 DIAGNOSIS — J449 Chronic obstructive pulmonary disease, unspecified: Secondary | ICD-10-CM | POA: Diagnosis not present

## 2017-03-21 DIAGNOSIS — Z1211 Encounter for screening for malignant neoplasm of colon: Secondary | ICD-10-CM | POA: Diagnosis not present

## 2017-03-21 DIAGNOSIS — I10 Essential (primary) hypertension: Secondary | ICD-10-CM | POA: Diagnosis not present

## 2017-03-21 DIAGNOSIS — F419 Anxiety disorder, unspecified: Secondary | ICD-10-CM | POA: Diagnosis not present

## 2017-03-28 DIAGNOSIS — E1165 Type 2 diabetes mellitus with hyperglycemia: Secondary | ICD-10-CM | POA: Diagnosis not present

## 2017-03-28 DIAGNOSIS — J441 Chronic obstructive pulmonary disease with (acute) exacerbation: Secondary | ICD-10-CM | POA: Diagnosis not present

## 2017-03-28 DIAGNOSIS — G4739 Other sleep apnea: Secondary | ICD-10-CM | POA: Diagnosis not present

## 2017-03-28 DIAGNOSIS — I10 Essential (primary) hypertension: Secondary | ICD-10-CM | POA: Diagnosis not present

## 2017-04-02 DIAGNOSIS — R51 Headache: Secondary | ICD-10-CM | POA: Diagnosis not present

## 2017-04-02 DIAGNOSIS — R0602 Shortness of breath: Secondary | ICD-10-CM | POA: Diagnosis not present

## 2017-04-02 DIAGNOSIS — I10 Essential (primary) hypertension: Secondary | ICD-10-CM | POA: Diagnosis not present

## 2017-04-02 DIAGNOSIS — F411 Generalized anxiety disorder: Secondary | ICD-10-CM | POA: Diagnosis not present

## 2017-04-02 DIAGNOSIS — F320 Major depressive disorder, single episode, mild: Secondary | ICD-10-CM | POA: Diagnosis not present

## 2017-04-02 DIAGNOSIS — G4730 Sleep apnea, unspecified: Secondary | ICD-10-CM | POA: Diagnosis not present

## 2017-04-02 DIAGNOSIS — Z9981 Dependence on supplemental oxygen: Secondary | ICD-10-CM | POA: Diagnosis not present

## 2017-04-02 DIAGNOSIS — I160 Hypertensive urgency: Secondary | ICD-10-CM | POA: Diagnosis not present

## 2017-04-02 DIAGNOSIS — Z87891 Personal history of nicotine dependence: Secondary | ICD-10-CM | POA: Diagnosis not present

## 2017-04-02 DIAGNOSIS — J441 Chronic obstructive pulmonary disease with (acute) exacerbation: Secondary | ICD-10-CM | POA: Diagnosis not present

## 2017-04-02 DIAGNOSIS — F329 Major depressive disorder, single episode, unspecified: Secondary | ICD-10-CM | POA: Diagnosis not present

## 2017-04-02 DIAGNOSIS — E119 Type 2 diabetes mellitus without complications: Secondary | ICD-10-CM | POA: Diagnosis not present

## 2017-04-03 DIAGNOSIS — J441 Chronic obstructive pulmonary disease with (acute) exacerbation: Secondary | ICD-10-CM | POA: Diagnosis not present

## 2017-04-03 DIAGNOSIS — F411 Generalized anxiety disorder: Secondary | ICD-10-CM | POA: Diagnosis not present

## 2017-04-03 DIAGNOSIS — E119 Type 2 diabetes mellitus without complications: Secondary | ICD-10-CM | POA: Diagnosis not present

## 2017-04-03 DIAGNOSIS — I160 Hypertensive urgency: Secondary | ICD-10-CM | POA: Diagnosis not present

## 2017-04-03 DIAGNOSIS — F320 Major depressive disorder, single episode, mild: Secondary | ICD-10-CM | POA: Diagnosis not present

## 2017-04-04 DIAGNOSIS — J441 Chronic obstructive pulmonary disease with (acute) exacerbation: Secondary | ICD-10-CM | POA: Diagnosis not present

## 2017-04-04 DIAGNOSIS — I160 Hypertensive urgency: Secondary | ICD-10-CM | POA: Diagnosis not present

## 2017-04-04 DIAGNOSIS — F411 Generalized anxiety disorder: Secondary | ICD-10-CM | POA: Diagnosis not present

## 2017-04-04 DIAGNOSIS — F320 Major depressive disorder, single episode, mild: Secondary | ICD-10-CM | POA: Diagnosis not present

## 2017-04-04 DIAGNOSIS — E119 Type 2 diabetes mellitus without complications: Secondary | ICD-10-CM | POA: Diagnosis not present

## 2017-04-12 DIAGNOSIS — G4733 Obstructive sleep apnea (adult) (pediatric): Secondary | ICD-10-CM | POA: Diagnosis not present

## 2017-04-12 DIAGNOSIS — Z6828 Body mass index (BMI) 28.0-28.9, adult: Secondary | ICD-10-CM | POA: Diagnosis not present

## 2017-04-12 DIAGNOSIS — K219 Gastro-esophageal reflux disease without esophagitis: Secondary | ICD-10-CM | POA: Diagnosis not present

## 2017-04-12 DIAGNOSIS — J441 Chronic obstructive pulmonary disease with (acute) exacerbation: Secondary | ICD-10-CM | POA: Diagnosis not present

## 2017-05-13 DIAGNOSIS — F339 Major depressive disorder, recurrent, unspecified: Secondary | ICD-10-CM | POA: Diagnosis not present

## 2017-06-11 DIAGNOSIS — Z0000 Encounter for general adult medical examination without abnormal findings: Secondary | ICD-10-CM | POA: Diagnosis not present

## 2017-06-11 DIAGNOSIS — G4733 Obstructive sleep apnea (adult) (pediatric): Secondary | ICD-10-CM | POA: Diagnosis not present

## 2017-06-11 DIAGNOSIS — J441 Chronic obstructive pulmonary disease with (acute) exacerbation: Secondary | ICD-10-CM | POA: Diagnosis not present

## 2017-06-11 DIAGNOSIS — K219 Gastro-esophageal reflux disease without esophagitis: Secondary | ICD-10-CM | POA: Diagnosis not present

## 2017-06-11 DIAGNOSIS — I10 Essential (primary) hypertension: Secondary | ICD-10-CM | POA: Diagnosis not present

## 2017-06-11 DIAGNOSIS — E1165 Type 2 diabetes mellitus with hyperglycemia: Secondary | ICD-10-CM | POA: Diagnosis not present

## 2017-06-11 DIAGNOSIS — Z1389 Encounter for screening for other disorder: Secondary | ICD-10-CM | POA: Diagnosis not present

## 2017-06-11 DIAGNOSIS — Z6829 Body mass index (BMI) 29.0-29.9, adult: Secondary | ICD-10-CM | POA: Diagnosis not present

## 2017-06-19 DIAGNOSIS — E1165 Type 2 diabetes mellitus with hyperglycemia: Secondary | ICD-10-CM | POA: Diagnosis not present

## 2017-06-19 DIAGNOSIS — I10 Essential (primary) hypertension: Secondary | ICD-10-CM | POA: Diagnosis not present

## 2017-06-19 DIAGNOSIS — K219 Gastro-esophageal reflux disease without esophagitis: Secondary | ICD-10-CM | POA: Diagnosis not present

## 2017-06-19 DIAGNOSIS — J441 Chronic obstructive pulmonary disease with (acute) exacerbation: Secondary | ICD-10-CM | POA: Diagnosis not present

## 2017-07-15 DIAGNOSIS — G4733 Obstructive sleep apnea (adult) (pediatric): Secondary | ICD-10-CM | POA: Diagnosis not present

## 2017-07-19 DIAGNOSIS — E1165 Type 2 diabetes mellitus with hyperglycemia: Secondary | ICD-10-CM | POA: Diagnosis not present

## 2017-07-19 DIAGNOSIS — J441 Chronic obstructive pulmonary disease with (acute) exacerbation: Secondary | ICD-10-CM | POA: Diagnosis not present

## 2017-07-19 DIAGNOSIS — I10 Essential (primary) hypertension: Secondary | ICD-10-CM | POA: Diagnosis not present

## 2017-07-19 DIAGNOSIS — K219 Gastro-esophageal reflux disease without esophagitis: Secondary | ICD-10-CM | POA: Diagnosis not present

## 2017-08-08 ENCOUNTER — Ambulatory Visit (INDEPENDENT_AMBULATORY_CARE_PROVIDER_SITE_OTHER): Payer: Medicare HMO | Admitting: Otolaryngology

## 2017-08-08 DIAGNOSIS — H93293 Other abnormal auditory perceptions, bilateral: Secondary | ICD-10-CM

## 2017-08-08 DIAGNOSIS — H9203 Otalgia, bilateral: Secondary | ICD-10-CM

## 2017-09-12 DIAGNOSIS — I10 Essential (primary) hypertension: Secondary | ICD-10-CM | POA: Diagnosis not present

## 2017-09-12 DIAGNOSIS — E1165 Type 2 diabetes mellitus with hyperglycemia: Secondary | ICD-10-CM | POA: Diagnosis not present

## 2017-09-12 DIAGNOSIS — Z1389 Encounter for screening for other disorder: Secondary | ICD-10-CM | POA: Diagnosis not present

## 2017-09-12 DIAGNOSIS — K219 Gastro-esophageal reflux disease without esophagitis: Secondary | ICD-10-CM | POA: Diagnosis not present

## 2017-09-12 DIAGNOSIS — Z0000 Encounter for general adult medical examination without abnormal findings: Secondary | ICD-10-CM | POA: Diagnosis not present

## 2017-09-12 DIAGNOSIS — G4733 Obstructive sleep apnea (adult) (pediatric): Secondary | ICD-10-CM | POA: Diagnosis not present

## 2017-09-12 DIAGNOSIS — Z6830 Body mass index (BMI) 30.0-30.9, adult: Secondary | ICD-10-CM | POA: Diagnosis not present

## 2017-09-12 DIAGNOSIS — Z6829 Body mass index (BMI) 29.0-29.9, adult: Secondary | ICD-10-CM | POA: Diagnosis not present

## 2017-09-12 DIAGNOSIS — J441 Chronic obstructive pulmonary disease with (acute) exacerbation: Secondary | ICD-10-CM | POA: Diagnosis not present

## 2017-09-17 DIAGNOSIS — J441 Chronic obstructive pulmonary disease with (acute) exacerbation: Secondary | ICD-10-CM | POA: Diagnosis not present

## 2017-09-17 DIAGNOSIS — K219 Gastro-esophageal reflux disease without esophagitis: Secondary | ICD-10-CM | POA: Diagnosis not present

## 2017-09-17 DIAGNOSIS — I10 Essential (primary) hypertension: Secondary | ICD-10-CM | POA: Diagnosis not present

## 2017-09-17 DIAGNOSIS — E1165 Type 2 diabetes mellitus with hyperglycemia: Secondary | ICD-10-CM | POA: Diagnosis not present

## 2017-09-23 DIAGNOSIS — G4733 Obstructive sleep apnea (adult) (pediatric): Secondary | ICD-10-CM | POA: Diagnosis not present

## 2017-09-23 DIAGNOSIS — G4710 Hypersomnia, unspecified: Secondary | ICD-10-CM | POA: Diagnosis not present

## 2017-09-23 DIAGNOSIS — G4725 Circadian rhythm sleep disorder, jet lag type: Secondary | ICD-10-CM | POA: Diagnosis not present

## 2017-09-28 DIAGNOSIS — F329 Major depressive disorder, single episode, unspecified: Secondary | ICD-10-CM | POA: Diagnosis not present

## 2017-09-28 DIAGNOSIS — F419 Anxiety disorder, unspecified: Secondary | ICD-10-CM | POA: Diagnosis not present

## 2017-09-28 DIAGNOSIS — Z833 Family history of diabetes mellitus: Secondary | ICD-10-CM | POA: Diagnosis not present

## 2017-09-28 DIAGNOSIS — Z825 Family history of asthma and other chronic lower respiratory diseases: Secondary | ICD-10-CM | POA: Diagnosis not present

## 2017-09-28 DIAGNOSIS — R51 Headache: Secondary | ICD-10-CM | POA: Diagnosis not present

## 2017-09-28 DIAGNOSIS — I10 Essential (primary) hypertension: Secondary | ICD-10-CM | POA: Diagnosis not present

## 2017-09-28 DIAGNOSIS — J449 Chronic obstructive pulmonary disease, unspecified: Secondary | ICD-10-CM | POA: Diagnosis not present

## 2017-09-28 DIAGNOSIS — Z87891 Personal history of nicotine dependence: Secondary | ICD-10-CM | POA: Diagnosis not present

## 2017-09-28 DIAGNOSIS — E119 Type 2 diabetes mellitus without complications: Secondary | ICD-10-CM | POA: Diagnosis not present

## 2017-09-28 DIAGNOSIS — Z8249 Family history of ischemic heart disease and other diseases of the circulatory system: Secondary | ICD-10-CM | POA: Diagnosis not present

## 2017-10-02 DIAGNOSIS — J441 Chronic obstructive pulmonary disease with (acute) exacerbation: Secondary | ICD-10-CM | POA: Diagnosis not present

## 2017-10-02 DIAGNOSIS — K219 Gastro-esophageal reflux disease without esophagitis: Secondary | ICD-10-CM | POA: Diagnosis not present

## 2017-10-02 DIAGNOSIS — E1165 Type 2 diabetes mellitus with hyperglycemia: Secondary | ICD-10-CM | POA: Diagnosis not present

## 2017-10-02 DIAGNOSIS — I10 Essential (primary) hypertension: Secondary | ICD-10-CM | POA: Diagnosis not present

## 2017-10-09 DIAGNOSIS — F339 Major depressive disorder, recurrent, unspecified: Secondary | ICD-10-CM | POA: Diagnosis not present

## 2017-10-12 DIAGNOSIS — J449 Chronic obstructive pulmonary disease, unspecified: Secondary | ICD-10-CM | POA: Diagnosis not present

## 2017-10-12 DIAGNOSIS — F329 Major depressive disorder, single episode, unspecified: Secondary | ICD-10-CM | POA: Diagnosis not present

## 2017-10-12 DIAGNOSIS — E119 Type 2 diabetes mellitus without complications: Secondary | ICD-10-CM | POA: Diagnosis not present

## 2017-10-12 DIAGNOSIS — R531 Weakness: Secondary | ICD-10-CM | POA: Diagnosis not present

## 2017-10-12 DIAGNOSIS — J441 Chronic obstructive pulmonary disease with (acute) exacerbation: Secondary | ICD-10-CM | POA: Diagnosis not present

## 2017-10-12 DIAGNOSIS — R110 Nausea: Secondary | ICD-10-CM | POA: Diagnosis not present

## 2017-10-12 DIAGNOSIS — I10 Essential (primary) hypertension: Secondary | ICD-10-CM | POA: Diagnosis not present

## 2017-10-12 DIAGNOSIS — R51 Headache: Secondary | ICD-10-CM | POA: Diagnosis not present

## 2017-10-12 DIAGNOSIS — I161 Hypertensive emergency: Secondary | ICD-10-CM | POA: Diagnosis not present

## 2017-10-12 DIAGNOSIS — F411 Generalized anxiety disorder: Secondary | ICD-10-CM | POA: Diagnosis not present

## 2017-10-13 DIAGNOSIS — J441 Chronic obstructive pulmonary disease with (acute) exacerbation: Secondary | ICD-10-CM | POA: Diagnosis not present

## 2017-10-13 DIAGNOSIS — F329 Major depressive disorder, single episode, unspecified: Secondary | ICD-10-CM | POA: Diagnosis not present

## 2017-10-13 DIAGNOSIS — I161 Hypertensive emergency: Secondary | ICD-10-CM | POA: Diagnosis not present

## 2017-10-13 DIAGNOSIS — F411 Generalized anxiety disorder: Secondary | ICD-10-CM | POA: Diagnosis not present

## 2017-10-13 DIAGNOSIS — E119 Type 2 diabetes mellitus without complications: Secondary | ICD-10-CM | POA: Diagnosis not present

## 2017-10-14 DIAGNOSIS — I161 Hypertensive emergency: Secondary | ICD-10-CM | POA: Diagnosis not present

## 2017-10-14 DIAGNOSIS — F411 Generalized anxiety disorder: Secondary | ICD-10-CM | POA: Diagnosis not present

## 2017-10-14 DIAGNOSIS — J441 Chronic obstructive pulmonary disease with (acute) exacerbation: Secondary | ICD-10-CM | POA: Diagnosis not present

## 2017-10-14 DIAGNOSIS — E119 Type 2 diabetes mellitus without complications: Secondary | ICD-10-CM | POA: Diagnosis not present

## 2017-10-14 DIAGNOSIS — F329 Major depressive disorder, single episode, unspecified: Secondary | ICD-10-CM | POA: Diagnosis not present

## 2017-10-25 DIAGNOSIS — J441 Chronic obstructive pulmonary disease with (acute) exacerbation: Secondary | ICD-10-CM | POA: Diagnosis not present

## 2017-10-25 DIAGNOSIS — E1165 Type 2 diabetes mellitus with hyperglycemia: Secondary | ICD-10-CM | POA: Diagnosis not present

## 2017-10-25 DIAGNOSIS — G4733 Obstructive sleep apnea (adult) (pediatric): Secondary | ICD-10-CM | POA: Diagnosis not present

## 2017-10-25 DIAGNOSIS — I10 Essential (primary) hypertension: Secondary | ICD-10-CM | POA: Diagnosis not present

## 2017-10-25 DIAGNOSIS — K219 Gastro-esophageal reflux disease without esophagitis: Secondary | ICD-10-CM | POA: Diagnosis not present

## 2017-10-28 DIAGNOSIS — Z6828 Body mass index (BMI) 28.0-28.9, adult: Secondary | ICD-10-CM | POA: Diagnosis not present

## 2017-10-28 DIAGNOSIS — I161 Hypertensive emergency: Secondary | ICD-10-CM | POA: Diagnosis not present

## 2017-11-06 DIAGNOSIS — F419 Anxiety disorder, unspecified: Secondary | ICD-10-CM | POA: Diagnosis not present

## 2017-11-13 DIAGNOSIS — F419 Anxiety disorder, unspecified: Secondary | ICD-10-CM | POA: Diagnosis not present

## 2017-11-14 ENCOUNTER — Encounter: Payer: Self-pay | Admitting: Cardiology

## 2017-11-25 DIAGNOSIS — E113293 Type 2 diabetes mellitus with mild nonproliferative diabetic retinopathy without macular edema, bilateral: Secondary | ICD-10-CM | POA: Diagnosis not present

## 2017-11-27 DIAGNOSIS — I10 Essential (primary) hypertension: Secondary | ICD-10-CM | POA: Diagnosis not present

## 2017-11-27 DIAGNOSIS — J441 Chronic obstructive pulmonary disease with (acute) exacerbation: Secondary | ICD-10-CM | POA: Diagnosis not present

## 2017-11-27 DIAGNOSIS — G4739 Other sleep apnea: Secondary | ICD-10-CM | POA: Diagnosis not present

## 2017-11-27 DIAGNOSIS — E1165 Type 2 diabetes mellitus with hyperglycemia: Secondary | ICD-10-CM | POA: Diagnosis not present

## 2017-11-29 DIAGNOSIS — E113311 Type 2 diabetes mellitus with moderate nonproliferative diabetic retinopathy with macular edema, right eye: Secondary | ICD-10-CM | POA: Diagnosis not present

## 2017-11-29 DIAGNOSIS — E113313 Type 2 diabetes mellitus with moderate nonproliferative diabetic retinopathy with macular edema, bilateral: Secondary | ICD-10-CM | POA: Diagnosis not present

## 2017-11-29 DIAGNOSIS — E113312 Type 2 diabetes mellitus with moderate nonproliferative diabetic retinopathy with macular edema, left eye: Secondary | ICD-10-CM | POA: Diagnosis not present

## 2017-12-10 DIAGNOSIS — E113312 Type 2 diabetes mellitus with moderate nonproliferative diabetic retinopathy with macular edema, left eye: Secondary | ICD-10-CM | POA: Diagnosis not present

## 2017-12-17 ENCOUNTER — Encounter: Payer: Self-pay | Admitting: Cardiology

## 2017-12-17 DIAGNOSIS — K210 Gastro-esophageal reflux disease with esophagitis: Secondary | ICD-10-CM | POA: Diagnosis not present

## 2017-12-17 DIAGNOSIS — Z6828 Body mass index (BMI) 28.0-28.9, adult: Secondary | ICD-10-CM | POA: Diagnosis not present

## 2017-12-17 DIAGNOSIS — E119 Type 2 diabetes mellitus without complications: Secondary | ICD-10-CM | POA: Diagnosis not present

## 2017-12-17 DIAGNOSIS — I161 Hypertensive emergency: Secondary | ICD-10-CM | POA: Diagnosis not present

## 2017-12-17 DIAGNOSIS — J440 Chronic obstructive pulmonary disease with acute lower respiratory infection: Secondary | ICD-10-CM | POA: Diagnosis not present

## 2017-12-17 NOTE — Progress Notes (Signed)
Cardiology Office Note  Date: 12/18/2017   ID: Amy Cobb, DOB 12/06/1960, MRN [id number]  PCP: Neale Burly, MD  Consulting Cardiologist: Rozann Lesches, MD   Chief Complaint  Patient presents with  . Hypertension    History of Present Illness: Amy Cobb is a 57 y.o. female referred for cardiology consultation by Dr. Sherrie Sport with diagnosis of hypertension.  Records indicate hospitalization at [facility] in February of this year with reported hypertensive emergency she underwent medication adjustments with adequate blood pressure control and had follow-up with Dr. Sherrie Sport in March.  She presented today with her home blood pressure cuff.  She states that she had been checking her home blood pressures while holding her arm straight up in the air and was getting recent low measurements.  In fact, she did not take any of her medications this morning prior to presenting, high blood pressure noted.  We did check her home cuff against our cuff in appropriate position and got similar numbers.  She has been on a variety of different medications over time.  She had a number of recent changes in March, currently on hydralazine, minoxidil, Cozaar, labetalol, chlorthalidone, and had been previously taking clonidine.  Most of these are at relatively low dose.  I do not have a good sense of what her actual blood pressure has been running based on available information.  She underwent previous cardiac testing back in 2015, low risk Lexiscan Myoview at that point with normal LVEF.  I personally reviewed her ECG today which shows sinus rhythm with nonspecific ST-T changes.  Past Medical History:  Diagnosis Date  . Asthma   . COPD (chronic obstructive pulmonary disease) ([place])   . Depression   . Essential hypertension   . Type 2 diabetes mellitus ([place])   . Vulvar cancer ([place]) 2003    Past Surgical History:  Procedure Laterality Date  . ABDOMINAL HYSTERECTOMY    .  CERVICAL FUSION    . CO2 LASER APPLICATION  0947   CO2 LASER VAPORIZATION OF THE VULVAR LESION  . SHOULDER SURGERY      Current Outpatient Medications  Medication Sig Dispense Refill  . albuterol (PROVENTIL HFA;VENTOLIN HFA) 108 (90 BASE) MCG/ACT inhaler Inhale 2 puffs into the lungs every 6 (six) hours as needed for wheezing or shortness of breath.    Marland Kitchen albuterol (PROVENTIL) (2.5 MG/3ML) 0.083% nebulizer solution Take 2.5 mg by nebulization every 8 (eight) hours as needed for wheezing or shortness of breath.     Marland Kitchen aspirin 81 MG chewable tablet Chew 81 mg by mouth daily.    . budesonide-formoterol (SYMBICORT) 160-4.5 MCG/ACT inhaler Inhale 2 puffs into the lungs 2 (two) times daily.    . busPIRone (BUSPAR) 15 MG tablet Take 15 mg by mouth 3 (three) times daily.    . chlorthalidone (HYGROTON) 25 MG tablet Take 25 mg by mouth daily.    . cloNIDine (CATAPRES) 0.2 MG tablet Take 0.2 mg by mouth 2 (two) times daily.    . Fish Oil-Cholecalciferol (FISH OIL + D3) 1000-1000 MG-UNIT CAPS Take 1,000 mg by mouth 3 (three) times daily.    Marland Kitchen FLUoxetine (PROZAC) 40 MG capsule Take 40 mg by mouth daily.    . hydrALAZINE (APRESOLINE) 25 MG tablet Take 25 mg by mouth daily.    . insulin glargine (LANTUS) 100 UNIT/ML injection Inject 50 Units into the skin 2 (two) times daily.    . Insulin Lispro (ADMELOG SOLOSTAR [place]) Inject 15  Units into the skin 3 (three) times daily.    Marland Kitchen labetalol (NORMODYNE) 300 MG tablet Take 300 mg by mouth daily.    Marland Kitchen losartan (COZAAR) 50 MG tablet Take 50 mg by mouth daily.    . meclizine (ANTIVERT) 25 MG tablet Take 25 mg by mouth 3 (three) times daily as needed for dizziness.    . minoxidil (LONITEN) 2.5 MG tablet Take 2.5 mg by mouth daily.    . sitaGLIPtan-metformin (JANUMET) 50-1000 MG per tablet Take 1 tablet by mouth 2 (two) times daily with a meal.    . tiotropium (SPIRIVA) 18 MCG inhalation capsule Place 18 mcg into inhaler and inhale daily.    . Vitamin D, Ergocalciferol,  (DRISDOL) 50000 UNITS CAPS capsule Take 50,000 Units by mouth every 7 (seven) days.     No current facility-administered medications for this visit.    Allergies:  Augmentin [amoxicillin-pot clavulanate] and Penicillins   Social History: The patient  reports that she quit smoking about 8 years ago. She quit smokeless tobacco use about 8 years ago. She reports that she drinks alcohol. She reports that she does not use drugs.   Family History: The patient's family history includes Hypertension in her mother and sister.   ROS:  Please see the history of present illness. Otherwise, complete review of systems is positive for none.  All other systems are reviewed and negative.   Physical Exam: VS:  BP (!) 170/108 (BP Location: Left Arm)   Pulse 89   Ht 5' 7.5" (1.715 m)   Wt 182 lb (82.6 kg)   SpO2 99%   BMI 28.08 kg/m , BMI Body mass index is 28.08 kg/m.  Wt Readings from Last 3 Encounters:  12/18/17 182 lb (82.6 kg)  12/25/16 196 lb (88.9 kg)  10/04/14 185 lb (83.9 kg)    General: Patient appears comfortable at rest. HEENT: Conjunctiva and lids normal, oropharynx clear. Neck: Supple, no elevated JVP or carotid bruits, no thyromegaly. Lungs: Clear to auscultation, nonlabored breathing at rest. Cardiac: Regular rate and rhythm, no S3 or significant systolic murmur, no pericardial rub. Abdomen: Soft, nontender, bowel sounds present, no guarding or rebound. Extremities: No pitting edema, distal pulses 2+. Skin: Warm and dry. Musculoskeletal: No kyphosis. Neuropsychiatric: Alert and oriented x3, affect grossly appropriate.  ECG: I personally reviewed the tracing from 10/12/2017 which showed sinus rhythm with left atrial enlargement, leftward axis, decreased R wave progression.  Recent Labwork:  February 2019: BUN 11, creatinine 0.64, potassium 3.4, AST 19, ALT 20, TSH 1.43, troponin T less than 0.01, hemoglobin 14.5, platelets 344  Other Studies Reviewed Today:  Chest x-ray  09/28/2017 ([place]): Negative 1 view chest x-ray.  Lexiscan Myoview 01/19/2014: IMPRESSION: 1.  Normal Lexiscan Cardiolite stress test.  2.  No evidence of myocardial ischemia or scar.  3.  Exaggerated hypertensive response noted.  4. Normal left ventricular systolic function and regional wall motion, calculated LVEF 74%.  5.  No chest pain was reported.  Assessment and Plan:  1.  Uncontrolled hypertension.  I do not have enough information yet to make determinations regarding a reasonable medical regimen going forward.  She was quite hypertensive this morning, but had not taken any of her medications yet.  She states that her blood pressures have been running low at home, but it does not look like she was checking her blood pressure appropriately.  We went over proper technique today, she will record blood pressure at least once a day for  the next few weeks and then will bring her back for a nurse visit.  I am hopeful that we can simplify her medications since she is on relatively low doses of most of them.  She will probably still need multimodal therapy to some degree.  2.  Previous reassuring ischemic work-up, low risk Myoview in 2015.  She states that she had a "stress test" at [facility] last year, will request these results as well.  No evidence of ACS during her hospitalization with hypertensive urgency.  3.  COPD with previous history of tobacco abuse.  4.  Type 2 diabetes mellitus, followed by Dr. Sherrie Sport.  Current medicines were reviewed with the patient today.   Orders Placed This Encounter  Procedures  . EKG 12-Lead    Disposition: Follow-up in 2 weeks for nurse visit, medication adjustments.   Signed, Satira Sark, MD, [facility] 12/18/2017 9:52 AM    [facility] [address] [place], [place] [address] Phone: [phone]; Fax: [phone]

## 2017-12-18 ENCOUNTER — Encounter: Payer: Self-pay | Admitting: Cardiology

## 2017-12-18 ENCOUNTER — Ambulatory Visit: Payer: Medicare HMO | Admitting: Cardiology

## 2017-12-18 VITALS — BP 170/108 | HR 89 | Ht 67.5 in | Wt 182.0 lb

## 2017-12-18 DIAGNOSIS — I10 Essential (primary) hypertension: Secondary | ICD-10-CM | POA: Diagnosis not present

## 2017-12-18 DIAGNOSIS — E118 Type 2 diabetes mellitus with unspecified complications: Secondary | ICD-10-CM

## 2017-12-18 DIAGNOSIS — J449 Chronic obstructive pulmonary disease, unspecified: Secondary | ICD-10-CM | POA: Diagnosis not present

## 2017-12-18 DIAGNOSIS — Z9289 Personal history of other medical treatment: Secondary | ICD-10-CM | POA: Diagnosis not present

## 2017-12-18 NOTE — Patient Instructions (Signed)
Medication Instructions:  Your physician recommends that you continue on your current medications as directed. Please refer to the Current Medication list given to you today.   Labwork: none  Testing/Procedures: none  Follow-Up: Your physician recommends that you schedule a follow-up appointment in: 2 week nurse bp check  Your physician recommends that you schedule a follow-up appointment in: 1 month     Any Other Special Instructions Will Be Listed Below (If Applicable).  Please keep a record of your blood pressure daily for 2 weeks , and bring readings to your follow up appointment for nurse visit   If you need a refill on your cardiac medications before your next appointment, please call your pharmacy.

## 2017-12-23 DIAGNOSIS — E113511 Type 2 diabetes mellitus with proliferative diabetic retinopathy with macular edema, right eye: Secondary | ICD-10-CM | POA: Diagnosis not present

## 2018-01-01 ENCOUNTER — Ambulatory Visit (INDEPENDENT_AMBULATORY_CARE_PROVIDER_SITE_OTHER): Payer: Medicare HMO | Admitting: *Deleted

## 2018-01-01 VITALS — BP 170/90 | HR 80 | Ht 67.5 in | Wt 180.0 lb

## 2018-01-01 DIAGNOSIS — I10 Essential (primary) hypertension: Secondary | ICD-10-CM | POA: Diagnosis not present

## 2018-01-01 MED ORDER — HYDRALAZINE HCL 50 MG PO TABS: 50.0000 mg | ORAL_TABLET | Freq: Two times a day (BID) | ORAL | 3 refills | Status: DC

## 2018-01-01 NOTE — Addendum Note (Signed)
Addended by: Levonne Hubert on: 01/01/2018 04:31 PM   Modules accepted: Orders

## 2018-01-01 NOTE — Patient Instructions (Addendum)
Medication Instructions:  Your physician has recommended you make the following change in your medication:   Increase hydralazine to 50 mg twice daily for now     Labwork: NONE   Testing/Procedures: NONE   Follow-Up: As Planned   Any Other Special Instructions Will Be Listed Below (If Applicable).     If you need a refill on your cardiac medications before your next appointment, please call your pharmacy. Thank you for choosing [facility]!

## 2018-01-01 NOTE — Progress Notes (Signed)
Let us have her increase hydralazine to 50 mg twice daily for now.

## 2018-01-01 NOTE — Progress Notes (Signed)
Patient states that she feels well today. She has been out working in her yard today. Denies feeling dizzy and having headaches. Pt states that she took second dose of medication just before BP check today. Pt did buy a new BP machine on 12/26/17 and stated at that time using it to check her BP for her log. States she has not missed medication. Please advise.

## 2018-01-07 DIAGNOSIS — E113312 Type 2 diabetes mellitus with moderate nonproliferative diabetic retinopathy with macular edema, left eye: Secondary | ICD-10-CM | POA: Diagnosis not present

## 2018-01-15 DIAGNOSIS — K210 Gastro-esophageal reflux disease with esophagitis: Secondary | ICD-10-CM | POA: Diagnosis not present

## 2018-01-15 DIAGNOSIS — E119 Type 2 diabetes mellitus without complications: Secondary | ICD-10-CM | POA: Diagnosis not present

## 2018-01-15 DIAGNOSIS — J441 Chronic obstructive pulmonary disease with (acute) exacerbation: Secondary | ICD-10-CM | POA: Diagnosis not present

## 2018-01-20 NOTE — Progress Notes (Signed)
Cardiology Office Note    Date:  01/21/2018   ID:  Amy Cobb, DOB 1960-11-29, MRN [id number]  PCP:  Neale Burly, MD  Cardiologist: Rozann Lesches, MD    Chief Complaint  Patient presents with  . Follow-up    4 week visit; Elevated BP    History of Present Illness:    Amy Cobb is a 57 y.o. female with past medical history of HTN, Type 2 DM, and COPD who presents to the office today for 3-month follow-up.   She was last examined by Dr. Domenic Polite on 12/18/2017 and records were reviewed from a recent hospitalization at [facility] for hypertensive emergency. BP was elevated to 170/108 at the time of her visit but she had not yet taken her morning medications. No changes were made to her medication regimen at that time but she presented back for a BP check on 01/01/2018 and BP remained elevated at 170/90, therefore Hydralazine was further increased from 25mg  BID to 50 mg twice daily and she was continued on Chlorthalidone 25mg  daily, Clonidine 0.2mg  BID, Labetalol 300mg  daily, Losartan 50mg  daily, and Minoxidil 2.5mg  daily.   In talking with the patient today, she reports overall feeling well and denies any specific chest pain, dyspnea on exertion, orthopnea, PND, or lower extremity edema. She has been checking her blood pressure multiple times per day and reports readings are overall well controlled in the morning hours but are typically elevated in the evenings. She brings with her a detailed BP log and BP has been in the 120's-140's/70's-90's during the AM hours but is elevated in the 150's-170's/80's-90's in the evening. During that timeframe, she reports having headaches and just overall "not feeling herself". She does report having slightly increased fatigue since Hydralazine was increased just a few weeks ago but says this is improving.  In reviewing her medication regimen, she had actually been on Clonidine 0.2 mg 3 times daily at the time of her visit last month along  with being on Labetalol 300 mg twice daily (updated in the system today).   Past Medical History:  Diagnosis Date  . Asthma   . COPD (chronic obstructive pulmonary disease) ([place])   . Depression   . Essential hypertension   . Type 2 diabetes mellitus ([place])   . Vulvar cancer ([place]) 2003    Past Surgical History:  Procedure Laterality Date  . ABDOMINAL HYSTERECTOMY    . CERVICAL FUSION    . CO2 LASER APPLICATION  9798   CO2 LASER VAPORIZATION OF THE VULVAR LESION  . SHOULDER SURGERY      Current Medications: Outpatient Medications Prior to Visit  Medication Sig Dispense Refill  . albuterol (PROVENTIL HFA;VENTOLIN HFA) 108 (90 BASE) MCG/ACT inhaler Inhale 2 puffs into the lungs every 6 (six) hours as needed for wheezing or shortness of breath.    Marland Kitchen albuterol (PROVENTIL) (2.5 MG/3ML) 0.083% nebulizer solution Take 2.5 mg by nebulization every 8 (eight) hours as needed for wheezing or shortness of breath.     Marland Kitchen aspirin 81 MG chewable tablet Chew 81 mg by mouth daily.    . budesonide-formoterol (SYMBICORT) 160-4.5 MCG/ACT inhaler Inhale 2 puffs into the lungs 2 (two) times daily.    . busPIRone (BUSPAR) 15 MG tablet Take 15 mg by mouth 3 (three) times daily.    . chlorthalidone (HYGROTON) 25 MG tablet Take 25 mg by mouth daily.    . Fish Oil-Cholecalciferol (FISH OIL + D3) 1000-1000 MG-UNIT CAPS Take 1,000 mg  by mouth 3 (three) times daily.    Marland Kitchen FLUoxetine (PROZAC) 40 MG capsule Take 40 mg by mouth daily.    . hydrALAZINE (APRESOLINE) 50 MG tablet Take 1 tablet (50 mg total) by mouth 2 (two) times daily. 180 tablet 3  . insulin glargine (LANTUS) 100 UNIT/ML injection Inject 50 Units into the skin 2 (two) times daily.    . Insulin Lispro (ADMELOG SOLOSTAR [place]) Inject 15 Units into the skin 3 (three) times daily.    . meclizine (ANTIVERT) 25 MG tablet Take 25 mg by mouth 3 (three) times daily as needed for dizziness.    . minoxidil (LONITEN) 2.5 MG tablet Take 2.5 mg by mouth daily.    .  sitaGLIPtan-metformin (JANUMET) 50-1000 MG per tablet Take 1 tablet by mouth 2 (two) times daily with a meal.    . tiotropium (SPIRIVA) 18 MCG inhalation capsule Place 18 mcg into inhaler and inhale daily.    . Vitamin D, Ergocalciferol, (DRISDOL) 50000 UNITS CAPS capsule Take 50,000 Units by mouth every 7 (seven) days.    . cloNIDine (CATAPRES) 0.2 MG tablet Take 0.2 mg by mouth 2 (two) times daily.    Marland Kitchen labetalol (NORMODYNE) 300 MG tablet Take 300 mg by mouth daily.    Marland Kitchen losartan (COZAAR) 50 MG tablet Take 50 mg by mouth daily.     No facility-administered medications prior to visit.      Allergies:   Augmentin [amoxicillin-pot clavulanate]; Penicillins; Gabapentin; Moxifloxacin; and Novolog [insulin aspart]   Social History   Socioeconomic History  . Marital status: Single    Spouse name: Not on file  . Number of children: Not on file  . Years of education: Not on file  . Highest education level: Not on file  Occupational History  . Not on file  Social Needs  . Financial resource strain: Not on file  . Food insecurity:    Worry: Not on file    Inability: Not on file  . Transportation needs:    Medical: Not on file    Non-medical: Not on file  Tobacco Use  . Smoking status: Former Smoker    Last attempt to quit: 12/25/2008    Years since quitting: 9.0  . Smokeless tobacco: Former Systems developer    Quit date: 12/17/2009  Substance and Sexual Activity  . Alcohol use: Yes    Comment: Occasional  . Drug use: No  . Sexual activity: Yes  Lifestyle  . Physical activity:    Days per week: Not on file    Minutes per session: Not on file  . Stress: Not on file  Relationships  . Social connections:    Talks on phone: Not on file    Gets together: Not on file    Attends religious service: Not on file    Active member of club or organization: Not on file    Attends meetings of clubs or organizations: Not on file    Relationship status: Not on file  Other Topics Concern  . Not on file    Social History Narrative  . Not on file     Family History:  The patient's family history includes Hypertension in her mother and sister.   Review of Systems:   Please see the history of present illness.     General:  No chills, fever, night sweats or weight changes. Positive for fatigue.  Cardiovascular:  No chest pain, dyspnea on exertion, edema, orthopnea, palpitations, paroxysmal nocturnal dyspnea. Dermatological: No rash, lesions/masses  Respiratory: No cough, dyspnea Urologic: No hematuria, dysuria Abdominal:   No nausea, vomiting, diarrhea, bright red blood per rectum, melena, or hematemesis Neurologic:  No visual changes, wkns, changes in mental status. Positive for headaches.   All other systems reviewed and are otherwise negative except as noted above.   Physical Exam:    VS:  BP (!) 152/98   Pulse 90   Ht 5' 7.5" (1.715 m)   Wt 183 lb 9.6 oz (83.3 kg)   SpO2 99%   BMI 28.33 kg/m    General: Well developed, well nourished [place] American female appearing in no acute distress. Head: Normocephalic, atraumatic, sclera non-icteric, no xanthomas, nares are without discharge.  Neck: No carotid bruits. JVD not elevated.  Lungs: Respirations regular and unlabored, without wheezes or rales.  Heart: Regular rate and rhythm. No S3 or S4.  No murmur, no rubs, or gallops appreciated. Abdomen: Soft, non-tender, non-distended with normoactive bowel sounds. No hepatomegaly. No rebound/guarding. No obvious abdominal masses. Msk:  Strength and tone appear normal for age. No joint deformities or effusions. Extremities: No clubbing or cyanosis. No lower extremity edema.  Distal pedal pulses are 2+ bilaterally. Neuro: Alert and oriented X 3. Moves all extremities spontaneously. No focal deficits noted. Psych:  Responds to questions appropriately with a normal affect. Skin: No rashes or lesions noted  Wt Readings from Last 3 Encounters:  01/21/18 183 lb 9.6 oz (83.3 kg)  01/01/18 180  lb (81.6 kg)  12/18/17 182 lb (82.6 kg)     Studies/Labs Reviewed:   EKG:  EKG is not ordered today.    Recent Labs: No results found for requested labs within last 8760 hours.   Lipid Panel No results found for: CHOL, TRIG, HDL, CHOLHDL, VLDL, LDLCALC, LDLDIRECT  Additional studies/ records that were reviewed today include:   NST: 01/2014 IMPRESSION: 1.  Normal Lexiscan Cardiolite stress test.  2.  No evidence of myocardial ischemia or scar.  3.  Exaggerated hypertensive response noted.  4. Normal left ventricular systolic function and regional wall motion, calculated LVEF 74%.  5.  No chest pain was reported.   Assessment:    1. Uncontrolled hypertension   2. Medication management   3. Type 2 diabetes mellitus with complication, without long-term current use of insulin ([place])      Plan:   In order of problems listed above:  1. Uncontrolled HTN - The patient has a history of difficult to control hypertension and has been on several medications which currently include Hydralazine 50 mg twice daily (just titrated two weeks ago), Chlorthalidone 25mg  daily, Clonidine 0.2mg  TID, Labetalol 300mg  BID, Losartan 50mg  daily, and Minoxidil 2.5mg  daily. BP has overall improved since her last office visit but she still has elevated evening readings in the 150's-170's/80's-90's. Over the course of the next several months, would like to simplify her medication regimen and we can hopefully stop low-dose Minoxidil at the very least. With her current readings, would recommend titration of Losartan to 100mg  daily. Will recheck a BMET in 2 weeks to assess kidney function and K+ levels. She will have a BP Check at that time. If still not as goal, can further titrate Hydralazine to 75mg  BID. If evening readings continue to be her main issue, could also consider making Hydralazine to TID dosing as she has been taking Clonidine TID for several years and reports compliance with this regimen.     2. IDDM - followed by PCP. Remains on Janumet and Lantus 50 U BID.  Medication Adjustments/Labs and Tests Ordered: Current medicines are reviewed at length with the patient today.  Concerns regarding medicines are outlined above.  Medication changes, Labs and Tests ordered today are listed in the Patient Instructions below. Patient Instructions  Medication Instructions:  Your physician has recommended you make the following change in your medication: Increase Losartan to 100 mg Daily   Labwork: Your physician recommends that you return for lab work in: 2 Weeks   Testing/Procedures: NONE   Follow-Up: Your physician recommends that you schedule a follow-up appointment in: 4-6 [place] has requested that you regularly monitor and record your blood pressure readings at home. Please use the same machine at the same time of day to check your readings and record them to bring to your follow-up visit.  Your physician recommends that you schedule a follow-up appointment in 2 Weeks for Blood Pressure Check.    Any Other Special Instructions Will Be Listed Below (If Applicable).   If you need a refill on your cardiac medications before your next appointment, please call your pharmacy.  Thank you for choosing [facility]!    Signed, Erma Heritage, PA-C  01/21/2018 5:14 PM    [facility] [address] [place], [place] [address] Phone: [phone]

## 2018-01-21 ENCOUNTER — Ambulatory Visit: Payer: Medicare HMO | Admitting: Student

## 2018-01-21 ENCOUNTER — Encounter: Payer: Self-pay | Admitting: Student

## 2018-01-21 VITALS — BP 152/98 | HR 90 | Ht 67.5 in | Wt 183.6 lb

## 2018-01-21 DIAGNOSIS — Z79899 Other long term (current) drug therapy: Secondary | ICD-10-CM | POA: Diagnosis not present

## 2018-01-21 DIAGNOSIS — E118 Type 2 diabetes mellitus with unspecified complications: Secondary | ICD-10-CM

## 2018-01-21 DIAGNOSIS — I10 Essential (primary) hypertension: Secondary | ICD-10-CM | POA: Diagnosis not present

## 2018-01-21 MED ORDER — LABETALOL HCL 300 MG PO TABS: 300.0000 mg | ORAL_TABLET | Freq: Two times a day (BID) | ORAL | Status: DC

## 2018-01-21 MED ORDER — LOSARTAN POTASSIUM 100 MG PO TABS: 100.0000 mg | ORAL_TABLET | Freq: Every day | ORAL | 3 refills | Status: DC

## 2018-01-21 MED ORDER — CLONIDINE HCL 0.2 MG PO TABS: 0.2000 mg | ORAL_TABLET | Freq: Three times a day (TID) | ORAL | Status: AC

## 2018-01-21 NOTE — Patient Instructions (Signed)
Medication Instructions:  Your physician has recommended you make the following change in your medication: Increase Losartan to 100 mg Daily   Labwork: Your physician recommends that you return for lab work in: 2 Weeks    Testing/Procedures: NONE   Follow-Up: Your physician recommends that you schedule a follow-up appointment in: 4-6 [place] has requested that you regularly monitor and record your blood pressure readings at home. Please use the same machine at the same time of day to check your readings and record them to bring to your follow-up visit.  Your physician recommends that you schedule a follow-up appointment in 2 Weeks for Blood Pressure Check.     Any Other Special Instructions Will Be Listed Below (If Applicable).     If you need a refill on your cardiac medications before your next appointment, please call your pharmacy.  Thank you for choosing [facility]!

## 2018-02-03 ENCOUNTER — Other Ambulatory Visit (HOSPITAL_COMMUNITY)
Admission: RE | Admit: 2018-02-03 | Discharge: 2018-02-03 | Disposition: A | Discharge status: Home / Self Care | Payer: Medicare HMO | Source: Ambulatory Visit | Attending: Student | Admitting: Student

## 2018-02-03 DIAGNOSIS — Z79899 Other long term (current) drug therapy: Secondary | ICD-10-CM | POA: Insufficient documentation

## 2018-02-03 DIAGNOSIS — F339 Major depressive disorder, recurrent, unspecified: Secondary | ICD-10-CM | POA: Diagnosis not present

## 2018-02-03 LAB — BASIC METABOLIC PANEL
Anion gap: 11 (ref 5–15)
BUN: 19 mg/dL (ref 6–20)
CHLORIDE: 98 mmol/L — AB (ref 101–111)
CO2: 31 mmol/L (ref 22–32)
CREATININE: 0.86 mg/dL (ref 0.44–1.00)
Calcium: 9.5 mg/dL (ref 8.9–10.3)
GFR calc Af Amer: >60 mL/min (ref >60)
GFR calc non Af Amer: >60 mL/min (ref >60)
Glucose, Bld: 177 mg/dL — ABNORMAL HIGH (ref 65–99)
Potassium: 3.3 mmol/L — ABNORMAL LOW (ref 3.5–5.1)
Sodium: 140 mmol/L (ref 135–145)

## 2018-02-04 DIAGNOSIS — E113312 Type 2 diabetes mellitus with moderate nonproliferative diabetic retinopathy with macular edema, left eye: Secondary | ICD-10-CM | POA: Diagnosis not present

## 2018-02-05 ENCOUNTER — Telehealth: Payer: Self-pay | Admitting: Student

## 2018-02-05 ENCOUNTER — Encounter: Payer: Self-pay | Admitting: *Deleted

## 2018-02-05 ENCOUNTER — Ambulatory Visit (INDEPENDENT_AMBULATORY_CARE_PROVIDER_SITE_OTHER): Payer: Medicare HMO | Admitting: *Deleted

## 2018-02-05 VITALS — BP 128/70 | HR 78 | Ht 67.5 in | Wt 182.4 lb

## 2018-02-05 DIAGNOSIS — I10 Essential (primary) hypertension: Secondary | ICD-10-CM

## 2018-02-05 NOTE — Patient Instructions (Signed)
Continue same medications Keep follow up appointment.

## 2018-02-05 NOTE — Telephone Encounter (Signed)
   BP appears well-controlled at the time of her visit today. Evening readings continue to be elevated by review of her BP log but have significantly improved over the past week. Would continue on her current regimen at this time. At the time of her next visit, can further address consolidating her regimen if BP overall remains stable.   Thanks,  Erma Heritage, PA-C 02/05/2018, 4:44 PM

## 2018-02-05 NOTE — Progress Notes (Signed)
Presents to office for nurse BP check per last office note. Patient has taken all doses of medications. No side effects reported. No c/o dizziness, chest pain or sob.

## 2018-02-24 ENCOUNTER — Ambulatory Visit: Payer: Medicare HMO | Admitting: Obstetrics & Gynecology

## 2018-02-24 ENCOUNTER — Encounter: Payer: Self-pay | Admitting: Obstetrics & Gynecology

## 2018-02-24 VITALS — BP 144/90 | Ht 66.25 in | Wt 179.4 lb

## 2018-02-24 DIAGNOSIS — N951 Menopausal and female climacteric states: Secondary | ICD-10-CM

## 2018-02-24 DIAGNOSIS — Z01419 Encounter for gynecological examination (general) (routine) without abnormal findings: Secondary | ICD-10-CM | POA: Diagnosis not present

## 2018-02-24 DIAGNOSIS — Z90710 Acquired absence of both cervix and uterus: Secondary | ICD-10-CM

## 2018-02-24 MED ORDER — ESTRADIOL 0.05 MG/24HR TD PTWK: 0.0500 mg | MEDICATED_PATCH | TRANSDERMAL | 4 refills | Status: DC

## 2018-02-24 NOTE — Progress Notes (Signed)
Amy Cobb 11/14/1960 [id number]   History:    57 y.o. G0 Stable same sex partner  RP:  Established patient presenting for annual gyn exam   HPI: TAH for Fibroids in her 74's.  Menopause with worsening hot flashes and night sweats.  Difficulty sleeping because of night sweats.  Would like to start on hormone replacement therapy.  No pelvic pain.  History of CIS of the vulva treated with CO2 laser in 2003.  No recurrence since then.  Urine and bowel movements normal.  Breasts normal.  Lost 17 pounds since last annual exam a year ago.  Body mass index now at 28.74.  Health labs with family physician.  Chronic hypertension on medication as well as diabetes mellitus treated with insulin.  Past medical history,surgical history, family history and social history were all reviewed and documented in the EPIC chart.  Gynecologic History No LMP recorded. Patient has had a hysterectomy. Contraception: status post hysterectomy Last Pap: 12/2016. Results were: Negative Last mammogram: 2017. Results were: normal per patient Bone Density: 12/2016 Normal Colonoscopy: 2018  Obstetric History OB History  Gravida Para Term Preterm AB Living  0 0 0 0 0 0  SAB TAB Ectopic Multiple Live Births  0 0 0 0 0     ROS: A ROS was performed and pertinent positives and negatives are included in the history.  GENERAL: No fevers or chills. HEENT: No change in vision, no earache, sore throat or sinus congestion. NECK: No pain or stiffness. CARDIOVASCULAR: No chest pain or pressure. No palpitations. PULMONARY: No shortness of breath, cough or wheeze. GASTROINTESTINAL: No abdominal pain, nausea, vomiting or diarrhea, melena or bright red blood per rectum. GENITOURINARY: No urinary frequency, urgency, hesitancy or dysuria. MUSCULOSKELETAL: No joint or muscle pain, no back pain, no recent trauma. DERMATOLOGIC: No rash, no itching, no lesions. ENDOCRINE: No polyuria, polydipsia, no heat or cold intolerance. No recent  change in weight. HEMATOLOGICAL: No anemia or easy bruising or bleeding. NEUROLOGIC: No headache, seizures, numbness, tingling or weakness. PSYCHIATRIC: No depression, no loss of interest in normal activity or change in sleep pattern.     Exam:   BP (!) 144/90   Ht 5' 6.25" (1.683 m)   Wt 179 lb 6.4 oz (81.4 kg)   BMI 28.74 kg/m   Body mass index is 28.74 kg/m.  General appearance : Well developed well nourished female. No acute distress HEENT: Eyes: no retinal hemorrhage or exudates,  Neck supple, trachea midline, no carotid bruits, no thyroidmegaly Lungs: Clear to auscultation, no rhonchi or wheezes, or rib retractions  Heart: Regular rate and rhythm, no murmurs or gallops Breast:Examined in sitting and supine position were symmetrical in appearance, no palpable masses or tenderness,  no skin retraction, no nipple inversion, no nipple discharge, no skin discoloration, no axillary or supraclavicular lymphadenopathy Abdomen: no palpable masses or tenderness, no rebound or guarding Extremities: no edema or skin discoloration or tenderness  Pelvic: Vulva: Normal             Vagina: No gross lesions or discharge  Cervix/Uterus absent  Adnexa  Without masses or tenderness  Anus: Normal   Assessment/Plan:  57 y.o. female for annual exam   1. Well female exam with routine gynecological exam Gynecologic exam status post total hysterectomy and menopause.  Pap test negative in May 2018, will repeat next year.  Breast exam normal.  Patient will schedule screening mammogram now.  Health labs with family physician.  Management of chronic hypertension  and diabetes mellitus treated with insulin with family physician and cardiologist.  2. S/P TAH (total abdominal hysterectomy)  3. Menopause syndrome Severe vasomotor symptoms of menopause.  No contraindication to hormone replacement therapy.  Status post total hysterectomy.  Decision to start on estradiol patch 0.05 mg weekly.  Risks of blood  clots leading to strokes or pulmonary embolism and mild increased risk of breast cancer after 10 years of use reviewed with patient.  Benefits outweigh the risks at this time.  Benefits of controlling the vasomotor menopausal symptoms, better preservation of bone mass, decreased risk of MI, decreased risk of colon cancer, improve skin and mood.  Patient voiced understanding of usage, risks and benefits of hormone replacement therapy.  Other orders - estradiol (CLIMARA - DOSED IN MG/24 HR) 0.05 mg/24hr patch; Place 1 patch (0.05 mg total) onto the skin once a week.  [name], 3:16 PM 02/24/2018

## 2018-02-26 ENCOUNTER — Encounter: Payer: Self-pay | Admitting: Obstetrics & Gynecology

## 2018-02-26 NOTE — Patient Instructions (Signed)
1. Well female exam with routine gynecological exam Gynecologic exam status post total hysterectomy and menopause.  Pap test negative in May 2018, will repeat next year.  Breast exam normal.  Patient will schedule screening mammogram now.  Health labs with family physician.  Management of chronic hypertension and diabetes mellitus treated with insulin with family physician and cardiologist.  2. S/P TAH (total abdominal hysterectomy)  3. Menopause syndrome Severe vasomotor symptoms of menopause.  No contraindication to hormone replacement therapy.  Status post total hysterectomy.  Decision to start on estradiol patch 0.05 mg weekly.  Risks of blood clots leading to strokes or pulmonary embolism and mild increased risk of breast cancer after 10 years of use reviewed with patient.  Benefits outweigh the risks at this time.  Benefits of controlling the vasomotor menopausal symptoms, better preservation of bone mass, decreased risk of MI, decreased risk of colon cancer, improve skin and mood.  Patient voiced understanding of usage, risks and benefits of hormone replacement therapy.  Other orders - estradiol (CLIMARA - DOSED IN MG/24 HR) 0.05 mg/24hr patch; Place 1 patch (0.05 mg total) onto the skin once a week.  Harman, it was a pleasure seeing you today!

## 2018-02-27 DIAGNOSIS — Z1231 Encounter for screening mammogram for malignant neoplasm of breast: Secondary | ICD-10-CM | POA: Diagnosis not present

## 2018-02-28 NOTE — Addendum Note (Signed)
Addended by: Alen Blew on: 02/28/2018 09:17 AM   Modules accepted: Level of Service

## 2018-03-10 NOTE — Progress Notes (Signed)
Cardiology Office Note    Date:  03/11/2018   ID:  NADEZHDA POLLITT, DOB 1961-01-19, MRN [id number]  PCP:  Neale Burly, MD  Cardiologist: Rozann Lesches, MD    Chief Complaint  Patient presents with  . Follow-up    6 week visit    History of Present Illness:    Amy Cobb is a 57 y.o. female with past medical history of HTN, Type 2 DM, and COPD who presents to the office today for 6-week follow-up.   She was last examined by myself on 01/21/2018 for continued follow-up of elevated BP. By review of her personal BP log, BP had been in the 120's-140's/70's-90's during the AM hours but was elevated in the 150's-170's/80's-90's in the evening. She reported associated headaches but denied any chest pain, dyspnea, or palpitations. BP was 152/98 at her visit and she was continued on Hydralazine 50 mg twice daily, Chlorthalidone 25mg  daily, Clonidine 0.2mg  TID, Labetalol 300mg  BID, and Minoxidil 2.5mg  daily with Losartan being titrated to 100mg  daily. Follow-up labs on 6/17 showed K+ at 3.3 and creatinine 0.86. Presented for a BP Check on 02/05/2018 and BP was well-controlled at 128/70, therefore she was continued on her current regimen.  In talking with the patient today, she reports overall doing well from a cardiac perspective since her last office visit. Denies any recent chest pain, dyspnea on exertion, orthopnea, PND, or lower extremity edema. She did notice worsening fatigue when taking Hydralazine 50 mg twice daily but says this has overall improved. Stopped  Minoxidil approximately two weeks ago and did not notice any significant changes in her BP readings. She recently traveled to [place] to visit family and says that she walked for several miles each day and noticed that her blood pressure was well controlled during that time. No anginal symptoms with increased physical activity.    Past Medical History:  Diagnosis Date  . Asthma   . COPD (chronic obstructive pulmonary  disease) ([place])   . Depression   . Essential hypertension   . Type 2 diabetes mellitus ([place])   . Vulvar cancer ([place]) 2003    Past Surgical History:  Procedure Laterality Date  . ABDOMINAL HYSTERECTOMY    . CERVICAL FUSION    . CO2 LASER APPLICATION  2263   CO2 LASER VAPORIZATION OF THE VULVAR LESION  . SHOULDER SURGERY      Current Medications: Outpatient Medications Prior to Visit  Medication Sig Dispense Refill  . albuterol (PROVENTIL HFA;VENTOLIN HFA) 108 (90 BASE) MCG/ACT inhaler Inhale 2 puffs into the lungs every 6 (six) hours as needed for wheezing or shortness of breath.    Marland Kitchen albuterol (PROVENTIL) (2.5 MG/3ML) 0.083% nebulizer solution Take 2.5 mg by nebulization every 8 (eight) hours as needed for wheezing or shortness of breath.     . budesonide-formoterol (SYMBICORT) 160-4.5 MCG/ACT inhaler Inhale 2 puffs into the lungs 2 (two) times daily.    . busPIRone (BUSPAR) 15 MG tablet Take 15 mg by mouth 3 (three) times daily.    . chlorthalidone (HYGROTON) 25 MG tablet Take 25 mg by mouth daily.    . cloNIDine (CATAPRES) 0.2 MG tablet Take 1 tablet (0.2 mg total) by mouth 3 (three) times daily.    Marland Kitchen estradiol (CLIMARA - DOSED IN MG/24 HR) 0.05 mg/24hr patch Place 1 patch (0.05 mg total) onto the skin once a week. 12 patch 4  . Fish Oil-Cholecalciferol (FISH OIL + D3) 1000-1000 MG-UNIT CAPS Take 1,000 mg  by mouth 3 (three) times daily.    Marland Kitchen FLUoxetine (PROZAC) 40 MG capsule Take 40 mg by mouth daily.    . insulin glargine (LANTUS) 100 UNIT/ML injection Inject 50 Units into the skin 2 (two) times daily.    . Insulin Lispro (ADMELOG SOLOSTAR [place]) Inject 15 Units into the skin 3 (three) times daily.    Marland Kitchen labetalol (NORMODYNE) 300 MG tablet Take 1 tablet (300 mg total) by mouth 2 (two) times daily.    Marland Kitchen losartan (COZAAR) 100 MG tablet Take 1 tablet (100 mg total) by mouth daily. 90 tablet 3  . meclizine (ANTIVERT) 25 MG tablet Take 25 mg by mouth 3 (three) times daily as needed for  dizziness.    . sitaGLIPtan-metformin (JANUMET) 50-1000 MG per tablet Take 1 tablet by mouth 2 (two) times daily with a meal.    . tiotropium (SPIRIVA) 18 MCG inhalation capsule Place 18 mcg into inhaler and inhale daily.    . Vitamin D, Ergocalciferol, (DRISDOL) 50000 UNITS CAPS capsule Take 50,000 Units by mouth every 7 (seven) days.    Marland Kitchen aspirin 81 MG chewable tablet Chew 81 mg by mouth daily.    . hydrALAZINE (APRESOLINE) 50 MG tablet Take 1 tablet (50 mg total) by mouth 2 (two) times daily. 180 tablet 3  . minoxidil (LONITEN) 2.5 MG tablet Take 2.5 mg by mouth daily.     No facility-administered medications prior to visit.      Allergies:   Augmentin [amoxicillin-pot clavulanate]; Penicillins; Gabapentin; Moxifloxacin; and Novolog [insulin aspart]   Social History   Socioeconomic History  . Marital status: Single    Spouse name: Not on file  . Number of children: Not on file  . Years of education: Not on file  . Highest education level: Not on file  Occupational History  . Not on file  Social Needs  . Financial resource strain: Not on file  . Food insecurity:    Worry: Not on file    Inability: Not on file  . Transportation needs:    Medical: Not on file    Non-medical: Not on file  Tobacco Use  . Smoking status: Former Smoker    Last attempt to quit: 12/25/2008    Years since quitting: 9.2  . Smokeless tobacco: Former Systems developer    Quit date: 12/17/2009  Substance and Sexual Activity  . Alcohol use: Yes    Comment: Occasional  . Drug use: No  . Sexual activity: Yes    Partners: Male    Comment: 1st intercourse- 5, partners- (3 female) (34 female),  married- 31 yrs   Lifestyle  . Physical activity:    Days per week: Not on file    Minutes per session: Not on file  . Stress: Not on file  Relationships  . Social connections:    Talks on phone: Not on file    Gets together: Not on file    Attends religious service: Not on file    Active member of club or organization: Not  on file    Attends meetings of clubs or organizations: Not on file    Relationship status: Not on file  Other Topics Concern  . Not on file  Social History Narrative  . Not on file     Family History:  The patient's family history includes Breast cancer in her cousin; Breast cancer (age of onset: 78) in her sister; Diabetes in her cousin and sister; Hypertension in her mother and sister.  Review of Systems:   Please see the history of present illness.     General:  No chills, fever, night sweats or weight changes. Positive for fatigue (improving). Cardiovascular:  No chest pain, dyspnea on exertion, edema, orthopnea, palpitations, paroxysmal nocturnal dyspnea. Dermatological: No rash, lesions/masses Respiratory: No cough, dyspnea Urologic: No hematuria, dysuria Abdominal:   No nausea, vomiting, diarrhea, bright red blood per rectum, melena, or hematemesis Neurologic:  No visual changes, wkns, changes in mental status.  All other systems reviewed and are otherwise negative except as noted above.   Physical Exam:    VS:  BP (!) 152/76   Pulse 84   Ht 5\' 7"  (1.702 m)   Wt 184 lb (83.5 kg)   SpO2 98%   BMI 28.82 kg/m    General: Well developed, well nourished [place] American female appearing in no acute distress. Head: Normocephalic, atraumatic, sclera non-icteric, no xanthomas, nares are without discharge.  Neck: No carotid bruits. JVD not elevated.  Lungs: Respirations regular and unlabored, without wheezes or rales.  Heart: Regular rate and rhythm. No S3 or S4.  No murmur, no rubs, or gallops appreciated. Abdomen: Soft, non-tender, non-distended with normoactive bowel sounds. No hepatomegaly. No rebound/guarding. No obvious abdominal masses. Msk:  Strength and tone appear normal for age. No joint deformities or effusions. Extremities: No clubbing or cyanosis. No lower extremity edema.  Distal pedal pulses are 2+ bilaterally. Neuro: Alert and oriented X 3. Moves all  extremities spontaneously. No focal deficits noted. Psych:  Responds to questions appropriately with a normal affect. Skin: No rashes or lesions noted  Wt Readings from Last 3 Encounters:  03/11/18 184 lb (83.5 kg)  02/24/18 179 lb 6.4 oz (81.4 kg)  02/05/18 182 lb 6.4 oz (82.7 kg)     Studies/Labs Reviewed:   EKG:  EKG is not ordered today.    Recent Labs: 02/03/2018: BUN 19; Creatinine, Ser 0.86; Potassium 3.3; Sodium 140   Lipid Panel No results found for: CHOL, TRIG, HDL, CHOLHDL, VLDL, LDLCALC, LDLDIRECT  Additional studies/ records that were reviewed today include:   NST: 01/2014 IMPRESSION: 1.  Normal Lexiscan Cardiolite stress test.  2.  No evidence of myocardial ischemia or scar.  3.  Exaggerated hypertensive response noted.  4. Normal left ventricular systolic function and regional wall motion, calculated LVEF 74%.  5.  No chest pain was reported.  Assessment:    1. Uncontrolled hypertension   2. Type 2 diabetes mellitus with complication, with long-term current use of insulin ([place])      Plan:   In order of problems listed above:  1. Uncontrolled HTN - The patient was initially referred to Cardiology for evaluation of elevated BP and was on several different medications which we have been trying to consolidate down over her past several visits. BP has overall significantly improved and by review of her blood pressure log today, morning readings are well-controlled but she still is having some spikes with SBP in the 160's in the evening hours. She is currently on Hydralazine 50 mg twice daily, Chlorthalidone 25mg  daily, Clonidine 0.2mg  TID, Labetalol 300mg  BID, and Losartan 100mg  daily. I recommended that we increase her Hydralazine dosing to 50 mg 3 times daily in an effort to assist with her variable evening readings. She did experience fatigue with this in the past. If unable to tolerate a higher dose of Hydralazine, would recommend switching to  Amlodipine which she was on previously several years ago and tolerated well by her report.  2. Type 2 DM - Followed by PCP. Currently on Lantus 50 units twice daily and Janumet.   Medication Adjustments/Labs and Tests Ordered: Current medicines are reviewed at length with the patient today.  Concerns regarding medicines are outlined above.  Medication changes, Labs and Tests ordered today are listed in the Patient Instructions below. Patient Instructions  Medication Instructions:  Your physician has recommended you make the following change in your medication:  Increase Hydralazine to 50 mg Three Times Daily    Labwork: NONE   Testing/Procedures: NONE   Follow-Up: Your physician wants you to follow-up in: 5-6 Months with Dr. Domenic Polite. You will receive a reminder letter in the mail two months in advance. If you don't receive a letter, please call our office to schedule the follow-up appointment.   Any Other Special Instructions Will Be Listed Below (If Applicable).  Your physician has requested that you regularly monitor and record your blood pressure readings at home. Please use the same machine at the same time of day to check your readings and record them to bring to your follow-up visit.   If you need a refill on your cardiac medications before your next appointment, please call your pharmacy.  Thank you for choosing [facility]!       Signed, Erma Heritage, PA-C  03/11/2018 5:09 PM    Amy S[address] [place], [place] [address] Phone: [phone]

## 2018-03-11 ENCOUNTER — Encounter: Payer: Self-pay | Admitting: Student

## 2018-03-11 ENCOUNTER — Ambulatory Visit: Payer: Medicare HMO | Admitting: Student

## 2018-03-11 VITALS — BP 152/76 | HR 84 | Ht 67.0 in | Wt 184.0 lb

## 2018-03-11 DIAGNOSIS — Z794 Long term (current) use of insulin: Secondary | ICD-10-CM

## 2018-03-11 DIAGNOSIS — I10 Essential (primary) hypertension: Secondary | ICD-10-CM

## 2018-03-11 DIAGNOSIS — E118 Type 2 diabetes mellitus with unspecified complications: Secondary | ICD-10-CM

## 2018-03-11 MED ORDER — HYDRALAZINE HCL 50 MG PO TABS: 50.0000 mg | ORAL_TABLET | Freq: Three times a day (TID) | ORAL | 3 refills | Status: DC

## 2018-03-11 NOTE — Patient Instructions (Addendum)
Medication Instructions:  Your physician has recommended you make the following change in your medication:  Increase Hydralazine to 50 mg Three Times Daily    Labwork: NONE   Testing/Procedures: NONE   Follow-Up: Your physician wants you to follow-up in: 5-6 Months with Dr. Domenic Polite. You will receive a reminder letter in the mail two months in advance. If you don't receive a letter, please call our office to schedule the follow-up appointment.   Any Other Special Instructions Will Be Listed Below (If Applicable).  Your physician has requested that you regularly monitor and record your blood pressure readings at home. Please use the same machine at the same time of day to check your readings and record them to bring to your follow-up visit.   If you need a refill on your cardiac medications before your next appointment, please call your pharmacy.  Thank you for choosing [facility]!

## 2018-03-19 ENCOUNTER — Telehealth: Payer: Self-pay

## 2018-03-19 NOTE — Telephone Encounter (Signed)
The generic Climara patches you prescribed for her are too expensive. Patient asked if you would send Rx for a "low dose Premarin"?

## 2018-03-20 MED ORDER — ESTROGENS CONJUGATED 0.3 MG PO TABS: 0.3000 mg | ORAL_TABLET | Freq: Every day | ORAL | 3 refills | Status: DC

## 2018-03-20 NOTE — Telephone Encounter (Signed)
Agree with Premarin 0.3.  1 tab daily #90, refill x 4.

## 2018-03-20 NOTE — Telephone Encounter (Signed)
Spoke with patient and let her know I am sending new Rx as Dr. Dellis Filbert agreed.  Rx sent. Patches Rx cancelled.

## 2018-03-24 DIAGNOSIS — E113312 Type 2 diabetes mellitus with moderate nonproliferative diabetic retinopathy with macular edema, left eye: Secondary | ICD-10-CM | POA: Diagnosis not present

## 2018-03-25 DIAGNOSIS — I161 Hypertensive emergency: Secondary | ICD-10-CM | POA: Diagnosis not present

## 2018-03-25 DIAGNOSIS — Z6827 Body mass index (BMI) 27.0-27.9, adult: Secondary | ICD-10-CM | POA: Diagnosis not present

## 2018-03-25 DIAGNOSIS — K210 Gastro-esophageal reflux disease with esophagitis: Secondary | ICD-10-CM | POA: Diagnosis not present

## 2018-03-25 DIAGNOSIS — Z6828 Body mass index (BMI) 28.0-28.9, adult: Secondary | ICD-10-CM | POA: Diagnosis not present

## 2018-03-25 DIAGNOSIS — E119 Type 2 diabetes mellitus without complications: Secondary | ICD-10-CM | POA: Diagnosis not present

## 2018-03-25 DIAGNOSIS — Z0000 Encounter for general adult medical examination without abnormal findings: Secondary | ICD-10-CM | POA: Diagnosis not present

## 2018-03-25 DIAGNOSIS — J440 Chronic obstructive pulmonary disease with acute lower respiratory infection: Secondary | ICD-10-CM | POA: Diagnosis not present

## 2018-03-31 DIAGNOSIS — G4733 Obstructive sleep apnea (adult) (pediatric): Secondary | ICD-10-CM | POA: Diagnosis not present

## 2018-03-31 DIAGNOSIS — G4723 Circadian rhythm sleep disorder, irregular sleep wake type: Secondary | ICD-10-CM | POA: Diagnosis not present

## 2018-04-02 ENCOUNTER — Telehealth: Payer: Self-pay | Admitting: *Deleted

## 2018-04-02 MED ORDER — ESTROGENS CONJUGATED 0.3 MG PO TABS: 0.3000 mg | ORAL_TABLET | Freq: Every day | ORAL | 3 refills | Status: DC

## 2018-04-02 NOTE — Telephone Encounter (Signed)
Patient called requesting premarin 0.3 mg tablet Rx be sent to location pharmacy. [facility] in [place]. Rx sent. Pt aware.

## 2018-04-11 DIAGNOSIS — I10 Essential (primary) hypertension: Secondary | ICD-10-CM | POA: Diagnosis not present

## 2018-04-11 DIAGNOSIS — J449 Chronic obstructive pulmonary disease, unspecified: Secondary | ICD-10-CM | POA: Diagnosis not present

## 2018-04-11 DIAGNOSIS — E1165 Type 2 diabetes mellitus with hyperglycemia: Secondary | ICD-10-CM | POA: Diagnosis not present

## 2018-04-23 DIAGNOSIS — E113312 Type 2 diabetes mellitus with moderate nonproliferative diabetic retinopathy with macular edema, left eye: Secondary | ICD-10-CM | POA: Diagnosis not present

## 2018-04-23 DIAGNOSIS — E113313 Type 2 diabetes mellitus with moderate nonproliferative diabetic retinopathy with macular edema, bilateral: Secondary | ICD-10-CM | POA: Diagnosis not present

## 2018-05-08 DIAGNOSIS — F339 Major depressive disorder, recurrent, unspecified: Secondary | ICD-10-CM | POA: Diagnosis not present

## 2018-05-15 DIAGNOSIS — Z833 Family history of diabetes mellitus: Secondary | ICD-10-CM | POA: Diagnosis not present

## 2018-05-15 DIAGNOSIS — R0602 Shortness of breath: Secondary | ICD-10-CM | POA: Diagnosis not present

## 2018-05-15 DIAGNOSIS — J449 Chronic obstructive pulmonary disease, unspecified: Secondary | ICD-10-CM | POA: Diagnosis not present

## 2018-05-15 DIAGNOSIS — E119 Type 2 diabetes mellitus without complications: Secondary | ICD-10-CM | POA: Diagnosis not present

## 2018-05-15 DIAGNOSIS — Z794 Long term (current) use of insulin: Secondary | ICD-10-CM | POA: Diagnosis not present

## 2018-05-15 DIAGNOSIS — R05 Cough: Secondary | ICD-10-CM | POA: Diagnosis not present

## 2018-05-15 DIAGNOSIS — Z825 Family history of asthma and other chronic lower respiratory diseases: Secondary | ICD-10-CM | POA: Diagnosis not present

## 2018-05-15 DIAGNOSIS — I10 Essential (primary) hypertension: Secondary | ICD-10-CM | POA: Diagnosis not present

## 2018-05-15 DIAGNOSIS — F419 Anxiety disorder, unspecified: Secondary | ICD-10-CM | POA: Diagnosis not present

## 2018-05-15 DIAGNOSIS — F329 Major depressive disorder, single episode, unspecified: Secondary | ICD-10-CM | POA: Diagnosis not present

## 2018-05-15 DIAGNOSIS — J441 Chronic obstructive pulmonary disease with (acute) exacerbation: Secondary | ICD-10-CM | POA: Diagnosis not present

## 2018-05-15 DIAGNOSIS — Z87891 Personal history of nicotine dependence: Secondary | ICD-10-CM | POA: Diagnosis not present

## 2018-05-18 DIAGNOSIS — G4733 Obstructive sleep apnea (adult) (pediatric): Secondary | ICD-10-CM | POA: Diagnosis not present

## 2018-05-19 ENCOUNTER — Telehealth: Payer: Self-pay | Admitting: Student

## 2018-05-19 DIAGNOSIS — G4733 Obstructive sleep apnea (adult) (pediatric): Secondary | ICD-10-CM | POA: Diagnosis not present

## 2018-05-19 NOTE — Telephone Encounter (Signed)
Patient is requesting Hydralazine 50 mg TID sent to [facility]. Per last office visit. / tg

## 2018-05-20 MED ORDER — HYDRALAZINE HCL 50 MG PO TABS: 50.0000 mg | ORAL_TABLET | Freq: Three times a day (TID) | ORAL | 3 refills | Status: DC

## 2018-05-20 NOTE — Telephone Encounter (Signed)
RX sent

## 2018-05-21 DIAGNOSIS — E113312 Type 2 diabetes mellitus with moderate nonproliferative diabetic retinopathy with macular edema, left eye: Secondary | ICD-10-CM | POA: Diagnosis not present

## 2018-05-29 DIAGNOSIS — J449 Chronic obstructive pulmonary disease, unspecified: Secondary | ICD-10-CM | POA: Diagnosis not present

## 2018-05-29 DIAGNOSIS — E1165 Type 2 diabetes mellitus with hyperglycemia: Secondary | ICD-10-CM | POA: Diagnosis not present

## 2018-05-29 DIAGNOSIS — I10 Essential (primary) hypertension: Secondary | ICD-10-CM | POA: Diagnosis not present

## 2018-06-16 DIAGNOSIS — G4733 Obstructive sleep apnea (adult) (pediatric): Secondary | ICD-10-CM | POA: Diagnosis not present

## 2018-06-17 DIAGNOSIS — G4733 Obstructive sleep apnea (adult) (pediatric): Secondary | ICD-10-CM | POA: Diagnosis not present

## 2018-06-19 DIAGNOSIS — H6501 Acute serous otitis media, right ear: Secondary | ICD-10-CM | POA: Diagnosis not present

## 2018-06-19 DIAGNOSIS — J440 Chronic obstructive pulmonary disease with acute lower respiratory infection: Secondary | ICD-10-CM | POA: Diagnosis not present

## 2018-06-19 DIAGNOSIS — Z6828 Body mass index (BMI) 28.0-28.9, adult: Secondary | ICD-10-CM | POA: Diagnosis not present

## 2018-06-25 DIAGNOSIS — J449 Chronic obstructive pulmonary disease, unspecified: Secondary | ICD-10-CM | POA: Diagnosis not present

## 2018-06-25 DIAGNOSIS — G4733 Obstructive sleep apnea (adult) (pediatric): Secondary | ICD-10-CM | POA: Diagnosis not present

## 2018-06-25 DIAGNOSIS — G4721 Circadian rhythm sleep disorder, delayed sleep phase type: Secondary | ICD-10-CM | POA: Diagnosis not present

## 2018-06-25 DIAGNOSIS — E7849 Other hyperlipidemia: Secondary | ICD-10-CM | POA: Diagnosis not present

## 2018-06-25 DIAGNOSIS — K210 Gastro-esophageal reflux disease with esophagitis: Secondary | ICD-10-CM | POA: Diagnosis not present

## 2018-06-25 DIAGNOSIS — Z6828 Body mass index (BMI) 28.0-28.9, adult: Secondary | ICD-10-CM | POA: Diagnosis not present

## 2018-06-25 DIAGNOSIS — I10 Essential (primary) hypertension: Secondary | ICD-10-CM | POA: Diagnosis not present

## 2018-06-26 DIAGNOSIS — E7849 Other hyperlipidemia: Secondary | ICD-10-CM | POA: Diagnosis not present

## 2018-06-26 DIAGNOSIS — J449 Chronic obstructive pulmonary disease, unspecified: Secondary | ICD-10-CM | POA: Diagnosis not present

## 2018-06-26 DIAGNOSIS — K210 Gastro-esophageal reflux disease with esophagitis: Secondary | ICD-10-CM | POA: Diagnosis not present

## 2018-06-26 DIAGNOSIS — I10 Essential (primary) hypertension: Secondary | ICD-10-CM | POA: Diagnosis not present

## 2018-06-30 DIAGNOSIS — E113312 Type 2 diabetes mellitus with moderate nonproliferative diabetic retinopathy with macular edema, left eye: Secondary | ICD-10-CM | POA: Diagnosis not present

## 2018-06-30 DIAGNOSIS — H40041 Steroid responder, right eye: Secondary | ICD-10-CM | POA: Diagnosis not present

## 2018-07-08 DIAGNOSIS — F339 Major depressive disorder, recurrent, unspecified: Secondary | ICD-10-CM | POA: Diagnosis not present

## 2018-07-23 DIAGNOSIS — G4733 Obstructive sleep apnea (adult) (pediatric): Secondary | ICD-10-CM | POA: Diagnosis not present

## 2018-07-28 DIAGNOSIS — E113312 Type 2 diabetes mellitus with moderate nonproliferative diabetic retinopathy with macular edema, left eye: Secondary | ICD-10-CM | POA: Diagnosis not present

## 2018-08-23 DIAGNOSIS — G4733 Obstructive sleep apnea (adult) (pediatric): Secondary | ICD-10-CM | POA: Diagnosis not present

## 2018-08-25 DIAGNOSIS — E7849 Other hyperlipidemia: Secondary | ICD-10-CM | POA: Diagnosis not present

## 2018-08-25 DIAGNOSIS — K210 Gastro-esophageal reflux disease with esophagitis: Secondary | ICD-10-CM | POA: Diagnosis not present

## 2018-08-25 DIAGNOSIS — J449 Chronic obstructive pulmonary disease, unspecified: Secondary | ICD-10-CM | POA: Diagnosis not present

## 2018-08-25 DIAGNOSIS — I10 Essential (primary) hypertension: Secondary | ICD-10-CM | POA: Diagnosis not present

## 2018-08-26 DIAGNOSIS — Z6828 Body mass index (BMI) 28.0-28.9, adult: Secondary | ICD-10-CM | POA: Diagnosis not present

## 2018-08-26 DIAGNOSIS — J3089 Other allergic rhinitis: Secondary | ICD-10-CM | POA: Diagnosis not present

## 2018-08-27 DIAGNOSIS — H40041 Steroid responder, right eye: Secondary | ICD-10-CM | POA: Diagnosis not present

## 2018-08-27 DIAGNOSIS — E113313 Type 2 diabetes mellitus with moderate nonproliferative diabetic retinopathy with macular edema, bilateral: Secondary | ICD-10-CM | POA: Diagnosis not present

## 2018-08-27 DIAGNOSIS — E113311 Type 2 diabetes mellitus with moderate nonproliferative diabetic retinopathy with macular edema, right eye: Secondary | ICD-10-CM | POA: Diagnosis not present

## 2018-08-27 DIAGNOSIS — E113312 Type 2 diabetes mellitus with moderate nonproliferative diabetic retinopathy with macular edema, left eye: Secondary | ICD-10-CM | POA: Diagnosis not present

## 2018-09-17 DIAGNOSIS — F339 Major depressive disorder, recurrent, unspecified: Secondary | ICD-10-CM | POA: Diagnosis not present

## 2018-09-23 DIAGNOSIS — G4733 Obstructive sleep apnea (adult) (pediatric): Secondary | ICD-10-CM | POA: Diagnosis not present

## 2018-09-24 DIAGNOSIS — E113312 Type 2 diabetes mellitus with moderate nonproliferative diabetic retinopathy with macular edema, left eye: Secondary | ICD-10-CM | POA: Diagnosis not present

## 2018-10-06 DIAGNOSIS — Z6826 Body mass index (BMI) 26.0-26.9, adult: Secondary | ICD-10-CM | POA: Diagnosis not present

## 2018-10-06 DIAGNOSIS — J449 Chronic obstructive pulmonary disease, unspecified: Secondary | ICD-10-CM | POA: Diagnosis not present

## 2018-10-06 DIAGNOSIS — Z1389 Encounter for screening for other disorder: Secondary | ICD-10-CM | POA: Diagnosis not present

## 2018-10-06 DIAGNOSIS — I10 Essential (primary) hypertension: Secondary | ICD-10-CM | POA: Diagnosis not present

## 2018-10-06 DIAGNOSIS — E1143 Type 2 diabetes mellitus with diabetic autonomic (poly)neuropathy: Secondary | ICD-10-CM | POA: Diagnosis not present

## 2018-10-06 DIAGNOSIS — Z0000 Encounter for general adult medical examination without abnormal findings: Secondary | ICD-10-CM | POA: Diagnosis not present

## 2018-10-13 NOTE — Progress Notes (Signed)
Cardiology Office Note  Date: 10/14/2018   ID: Amy Cobb, DOB 11-03-1960, MRN [id number]  PCP: Neale Burly, MD  Primary Cardiologist: Rozann Lesches, MD   Chief Complaint  Patient presents with  . Follow-up hypertension    History of Present Illness: Amy Cobb is a 58 y.o. female last seen by Ms. Strader PA-C in July 2019.  She is here for a routine follow-up visit.  She does not report any progressive shortness of breath or exertional chest pain.  States that she has been taking her medications as outlined below.  I reviewed her home blood pressure checks over the last 2 months, and actually her average blood pressure looks reasonable.  She has had only a few outliers.  She continues to follow with Dr. Sherrie Sport.  She underwent ischemic testing back in 2015 which was low risk.  She has not had any interval cardiac structural testing to assess for cardiomyopathy.  Discussed obtaining an echocardiogram for surveillance.  Past Medical History:  Diagnosis Date  . Asthma   . COPD (chronic obstructive pulmonary disease) ([place])   . Depression   . Essential hypertension   . Type 2 diabetes mellitus ([place])   . Vulvar cancer ([place]) 2003    Past Surgical History:  Procedure Laterality Date  . ABDOMINAL HYSTERECTOMY    . CERVICAL FUSION    . CO2 LASER APPLICATION  5188   CO2 LASER VAPORIZATION OF THE VULVAR LESION  . SHOULDER SURGERY      Current Outpatient Medications  Medication Sig Dispense Refill  . albuterol (PROVENTIL HFA;VENTOLIN HFA) 108 (90 BASE) MCG/ACT inhaler Inhale 2 puffs into the lungs every 6 (six) hours as needed for wheezing or shortness of breath.    Marland Kitchen albuterol (PROVENTIL) (2.5 MG/3ML) 0.083% nebulizer solution Take 2.5 mg by nebulization every 8 (eight) hours as needed for wheezing or shortness of breath.     . budesonide-formoterol (SYMBICORT) 160-4.5 MCG/ACT inhaler Inhale 2 puffs into the lungs 2 (two) times daily.    . busPIRone (BUSPAR) 15 MG  tablet Take 15 mg by mouth 3 (three) times daily.    . chlorthalidone (HYGROTON) 25 MG tablet Take 25 mg by mouth daily.    . cloNIDine (CATAPRES) 0.2 MG tablet Take 1 tablet (0.2 mg total) by mouth 3 (three) times daily.    Marland Kitchen estrogens, conjugated, (PREMARIN) 0.3 MG tablet Take 1 tablet (0.3 mg total) by mouth daily. 90 tablet 3  . Fish Oil-Cholecalciferol (FISH OIL + D3) 1000-1000 MG-UNIT CAPS Take 1,000 mg by mouth 3 (three) times daily.    Marland Kitchen FLUoxetine (PROZAC) 40 MG capsule Take 40 mg by mouth daily.    . hydrALAZINE (APRESOLINE) 50 MG tablet Take 1 tablet (50 mg total) by mouth 3 (three) times daily. 270 tablet 3  . insulin glargine (LANTUS) 100 UNIT/ML injection Inject 50 Units into the skin 2 (two) times daily.    . Insulin Lispro (ADMELOG SOLOSTAR [place]) Inject 15 Units into the skin 3 (three) times daily.    Marland Kitchen labetalol (NORMODYNE) 300 MG tablet Take 1 tablet (300 mg total) by mouth 2 (two) times daily.    . meclizine (ANTIVERT) 25 MG tablet Take 25 mg by mouth 3 (three) times daily as needed for dizziness.    . sitaGLIPtan-metformin (JANUMET) 50-1000 MG per tablet Take 1 tablet by mouth 2 (two) times daily with a meal.    . tiotropium (SPIRIVA) 18 MCG inhalation capsule Place 18 mcg into inhaler and  inhale daily.    . Vitamin D, Ergocalciferol, (DRISDOL) 50000 UNITS CAPS capsule Take 50,000 Units by mouth every 7 (seven) days.    Marland Kitchen losartan (COZAAR) 100 MG tablet Take 1 tablet (100 mg total) by mouth daily. 90 tablet 3   No current facility-administered medications for this visit.    Allergies:  Augmentin [amoxicillin-pot clavulanate]; Penicillins; Gabapentin; Moxifloxacin; and Novolog [insulin aspart]   Social History: The patient  reports that she quit smoking about 9 years ago. She quit smokeless tobacco use about 8 years ago. She reports current alcohol use. She reports that she does not use drugs.   ROS:  Please see the history of present illness. Otherwise, complete review of  systems is positive for none.  All other systems are reviewed and negative.   Physical Exam: VS:  BP 114/60   Pulse 90   Ht 5' 0.7" (1.542 m)   Wt 174 lb (78.9 kg)   SpO2 96%   BMI 33.20 kg/m , BMI Body mass index is 33.2 kg/m.  Wt Readings from Last 3 Encounters:  10/14/18 174 lb (78.9 kg)  03/11/18 184 lb (83.5 kg)  02/24/18 179 lb 6.4 oz (81.4 kg)    General: Patient appears comfortable at rest. HEENT: Conjunctiva and lids normal, oropharynx clear. Neck: Supple, no elevated JVP or carotid bruits, no thyromegaly. Lungs: Clear to auscultation, nonlabored breathing at rest. Cardiac: Regular rate and rhythm, no S3 or significant systolic murmur. Abdomen: Soft, nontender, bowel sounds present. Extremities: No pitting edema, distal pulses 2+. Skin: Warm and dry. Musculoskeletal: No kyphosis. Neuropsychiatric: Alert and oriented x3, affect grossly appropriate.  ECG: I personally reviewed the tracing from 12/18/2017 which showed sinus rhythm with nonspecific ST-T changes.  Recent Labwork: 02/03/2018: BUN 19; Creatinine, Ser 0.86; Potassium 3.3; Sodium 140   Other Studies Reviewed Today:  Lexiscan Cardiolite 01/19/2014: IMPRESSION: 1.  Normal Lexiscan Cardiolite stress test.  2.  No evidence of myocardial ischemia or scar.  3.  Exaggerated hypertensive response noted.  4. Normal left ventricular systolic function and regional wall motion, calculated LVEF 74%.  5.  No chest pain was reported.  Assessment and Plan:  1.  Longstanding hypertension with hypertensive heart disease, no definite heart failure symptoms.  She had a low risk ischemic work-up in 2015 and reports no angina symptoms.  Plan to continue multimodal antihypertensive therapy which is reasonably effective at this point.  We will also obtain a follow-up echocardiogram to reassess cardiac structure and function.  2.  COPD with prior history of tobacco abuse.  3.  Type 2 diabetes mellitus, followed by Dr.  Sherrie Sport.  Current medicines were reviewed with the patient today.   Orders Placed This Encounter  Procedures  . ECHOCARDIOGRAM COMPLETE    Disposition: Follow-up in 1 year.  Signed, Satira Sark, MD, [facility] 10/14/2018 2:08 PM    [facility] at [facility] [address] [place],  [address] Phone: [phone]; Fax: [phone]

## 2018-10-14 ENCOUNTER — Encounter: Payer: Self-pay | Admitting: Cardiology

## 2018-10-14 ENCOUNTER — Ambulatory Visit: Payer: Medicare HMO | Admitting: Cardiology

## 2018-10-14 VITALS — BP 114/60 | HR 90 | Ht 60.7 in | Wt 174.0 lb

## 2018-10-14 DIAGNOSIS — I119 Hypertensive heart disease without heart failure: Secondary | ICD-10-CM | POA: Diagnosis not present

## 2018-10-14 DIAGNOSIS — E118 Type 2 diabetes mellitus with unspecified complications: Secondary | ICD-10-CM | POA: Diagnosis not present

## 2018-10-14 DIAGNOSIS — I10 Essential (primary) hypertension: Secondary | ICD-10-CM | POA: Diagnosis not present

## 2018-10-14 DIAGNOSIS — J449 Chronic obstructive pulmonary disease, unspecified: Secondary | ICD-10-CM | POA: Diagnosis not present

## 2018-10-14 NOTE — Patient Instructions (Signed)
Medication Instructions:  Your physician recommends that you continue on your current medications as directed. Please refer to the Current Medication list given to you today.   Labwork: NONE  Testing/Procedures: Your physician has requested that you have an echocardiogram. Echocardiography is a painless test that uses sound waves to create images of your heart. It provides your doctor with information about the size and shape of your heart and how well your heart's chambers and valves are working. This procedure takes approximately one hour. There are no restrictions for this procedure.    Follow-Up: Your physician wants you to follow-up in: 1 YEAR.  You will receive a reminder letter in the mail two months in advance. If you don't receive a letter, please call our office to schedule the follow-up appointment.   Any Other Special Instructions Will Be Listed Below (If Applicable).     If you need a refill on your cardiac medications before your next appointment, please call your pharmacy.   

## 2018-10-17 DIAGNOSIS — J449 Chronic obstructive pulmonary disease, unspecified: Secondary | ICD-10-CM | POA: Diagnosis not present

## 2018-10-17 DIAGNOSIS — E1143 Type 2 diabetes mellitus with diabetic autonomic (poly)neuropathy: Secondary | ICD-10-CM | POA: Diagnosis not present

## 2018-10-17 DIAGNOSIS — I10 Essential (primary) hypertension: Secondary | ICD-10-CM | POA: Diagnosis not present

## 2018-10-17 DIAGNOSIS — Z6826 Body mass index (BMI) 26.0-26.9, adult: Secondary | ICD-10-CM | POA: Diagnosis not present

## 2018-10-17 DIAGNOSIS — Z1389 Encounter for screening for other disorder: Secondary | ICD-10-CM | POA: Diagnosis not present

## 2018-10-17 DIAGNOSIS — Z0000 Encounter for general adult medical examination without abnormal findings: Secondary | ICD-10-CM | POA: Diagnosis not present

## 2018-10-22 DIAGNOSIS — E113312 Type 2 diabetes mellitus with moderate nonproliferative diabetic retinopathy with macular edema, left eye: Secondary | ICD-10-CM | POA: Diagnosis not present

## 2018-10-22 DIAGNOSIS — G4733 Obstructive sleep apnea (adult) (pediatric): Secondary | ICD-10-CM | POA: Diagnosis not present

## 2018-10-24 ENCOUNTER — Ambulatory Visit (HOSPITAL_COMMUNITY)
Admission: RE | Admit: 2018-10-24 | Discharge: 2018-10-24 | Disposition: A | Discharge status: Home / Self Care | Payer: Medicare HMO | Source: Ambulatory Visit | Attending: Cardiology | Admitting: Cardiology

## 2018-10-24 ENCOUNTER — Other Ambulatory Visit: Payer: Self-pay

## 2018-10-24 DIAGNOSIS — I119 Hypertensive heart disease without heart failure: Secondary | ICD-10-CM | POA: Insufficient documentation

## 2018-10-24 NOTE — Progress Notes (Signed)
*  PRELIMINARY RESULTS* Echocardiogram 2D Echocardiogram has been performed.  Amy Cobb 10/24/2018, 3:49 PM

## 2018-10-29 ENCOUNTER — Other Ambulatory Visit: Payer: Self-pay | Admitting: Student

## 2018-10-29 DIAGNOSIS — J449 Chronic obstructive pulmonary disease, unspecified: Secondary | ICD-10-CM | POA: Diagnosis not present

## 2018-10-29 DIAGNOSIS — I10 Essential (primary) hypertension: Secondary | ICD-10-CM | POA: Diagnosis not present

## 2018-10-29 DIAGNOSIS — E1143 Type 2 diabetes mellitus with diabetic autonomic (poly)neuropathy: Secondary | ICD-10-CM | POA: Diagnosis not present

## 2018-11-03 DIAGNOSIS — G4721 Circadian rhythm sleep disorder, delayed sleep phase type: Secondary | ICD-10-CM | POA: Diagnosis not present

## 2018-11-03 DIAGNOSIS — G4733 Obstructive sleep apnea (adult) (pediatric): Secondary | ICD-10-CM | POA: Diagnosis not present

## 2018-11-07 DIAGNOSIS — G4733 Obstructive sleep apnea (adult) (pediatric): Secondary | ICD-10-CM | POA: Diagnosis not present

## 2018-11-19 DIAGNOSIS — H40041 Steroid responder, right eye: Secondary | ICD-10-CM | POA: Diagnosis not present

## 2018-11-19 DIAGNOSIS — E113311 Type 2 diabetes mellitus with moderate nonproliferative diabetic retinopathy with macular edema, right eye: Secondary | ICD-10-CM | POA: Diagnosis not present

## 2018-11-19 DIAGNOSIS — E113312 Type 2 diabetes mellitus with moderate nonproliferative diabetic retinopathy with macular edema, left eye: Secondary | ICD-10-CM | POA: Diagnosis not present

## 2018-11-19 DIAGNOSIS — E113313 Type 2 diabetes mellitus with moderate nonproliferative diabetic retinopathy with macular edema, bilateral: Secondary | ICD-10-CM | POA: Diagnosis not present

## 2018-11-20 DIAGNOSIS — J449 Chronic obstructive pulmonary disease, unspecified: Secondary | ICD-10-CM | POA: Diagnosis not present

## 2018-11-20 DIAGNOSIS — E1143 Type 2 diabetes mellitus with diabetic autonomic (poly)neuropathy: Secondary | ICD-10-CM | POA: Diagnosis not present

## 2018-11-20 DIAGNOSIS — I10 Essential (primary) hypertension: Secondary | ICD-10-CM | POA: Diagnosis not present

## 2018-11-22 DIAGNOSIS — G4733 Obstructive sleep apnea (adult) (pediatric): Secondary | ICD-10-CM | POA: Diagnosis not present

## 2018-12-17 DIAGNOSIS — E113313 Type 2 diabetes mellitus with moderate nonproliferative diabetic retinopathy with macular edema, bilateral: Secondary | ICD-10-CM | POA: Diagnosis not present

## 2018-12-17 DIAGNOSIS — E113312 Type 2 diabetes mellitus with moderate nonproliferative diabetic retinopathy with macular edema, left eye: Secondary | ICD-10-CM | POA: Diagnosis not present

## 2018-12-17 DIAGNOSIS — E113311 Type 2 diabetes mellitus with moderate nonproliferative diabetic retinopathy with macular edema, right eye: Secondary | ICD-10-CM | POA: Diagnosis not present

## 2018-12-22 DIAGNOSIS — G4733 Obstructive sleep apnea (adult) (pediatric): Secondary | ICD-10-CM | POA: Diagnosis not present

## 2018-12-25 DIAGNOSIS — J449 Chronic obstructive pulmonary disease, unspecified: Secondary | ICD-10-CM | POA: Diagnosis not present

## 2018-12-25 DIAGNOSIS — E1143 Type 2 diabetes mellitus with diabetic autonomic (poly)neuropathy: Secondary | ICD-10-CM | POA: Diagnosis not present

## 2018-12-25 DIAGNOSIS — I10 Essential (primary) hypertension: Secondary | ICD-10-CM | POA: Diagnosis not present

## 2019-01-06 DIAGNOSIS — Z6827 Body mass index (BMI) 27.0-27.9, adult: Secondary | ICD-10-CM | POA: Diagnosis not present

## 2019-01-06 DIAGNOSIS — I10 Essential (primary) hypertension: Secondary | ICD-10-CM | POA: Diagnosis not present

## 2019-01-06 DIAGNOSIS — J309 Allergic rhinitis, unspecified: Secondary | ICD-10-CM | POA: Diagnosis not present

## 2019-01-06 DIAGNOSIS — E1143 Type 2 diabetes mellitus with diabetic autonomic (poly)neuropathy: Secondary | ICD-10-CM | POA: Diagnosis not present

## 2019-01-06 DIAGNOSIS — J449 Chronic obstructive pulmonary disease, unspecified: Secondary | ICD-10-CM | POA: Diagnosis not present

## 2019-01-16 DIAGNOSIS — E113312 Type 2 diabetes mellitus with moderate nonproliferative diabetic retinopathy with macular edema, left eye: Secondary | ICD-10-CM | POA: Diagnosis not present

## 2019-01-16 DIAGNOSIS — E113313 Type 2 diabetes mellitus with moderate nonproliferative diabetic retinopathy with macular edema, bilateral: Secondary | ICD-10-CM | POA: Diagnosis not present

## 2019-01-16 DIAGNOSIS — E113311 Type 2 diabetes mellitus with moderate nonproliferative diabetic retinopathy with macular edema, right eye: Secondary | ICD-10-CM | POA: Diagnosis not present

## 2019-01-22 DIAGNOSIS — E1143 Type 2 diabetes mellitus with diabetic autonomic (poly)neuropathy: Secondary | ICD-10-CM | POA: Diagnosis not present

## 2019-01-22 DIAGNOSIS — G4733 Obstructive sleep apnea (adult) (pediatric): Secondary | ICD-10-CM | POA: Diagnosis not present

## 2019-01-22 DIAGNOSIS — I10 Essential (primary) hypertension: Secondary | ICD-10-CM | POA: Diagnosis not present

## 2019-01-22 DIAGNOSIS — J449 Chronic obstructive pulmonary disease, unspecified: Secondary | ICD-10-CM | POA: Diagnosis not present

## 2019-01-23 DIAGNOSIS — H01111 Allergic dermatitis of right upper eyelid: Secondary | ICD-10-CM | POA: Diagnosis not present

## 2019-01-23 DIAGNOSIS — H01112 Allergic dermatitis of right lower eyelid: Secondary | ICD-10-CM | POA: Diagnosis not present

## 2019-01-27 DIAGNOSIS — F339 Major depressive disorder, recurrent, unspecified: Secondary | ICD-10-CM | POA: Diagnosis not present

## 2019-02-03 DIAGNOSIS — H01111 Allergic dermatitis of right upper eyelid: Secondary | ICD-10-CM | POA: Diagnosis not present

## 2019-02-03 DIAGNOSIS — H01112 Allergic dermatitis of right lower eyelid: Secondary | ICD-10-CM | POA: Diagnosis not present

## 2019-02-13 DIAGNOSIS — E113311 Type 2 diabetes mellitus with moderate nonproliferative diabetic retinopathy with macular edema, right eye: Secondary | ICD-10-CM | POA: Diagnosis not present

## 2019-02-13 DIAGNOSIS — E113312 Type 2 diabetes mellitus with moderate nonproliferative diabetic retinopathy with macular edema, left eye: Secondary | ICD-10-CM | POA: Diagnosis not present

## 2019-02-13 DIAGNOSIS — E113313 Type 2 diabetes mellitus with moderate nonproliferative diabetic retinopathy with macular edema, bilateral: Secondary | ICD-10-CM | POA: Diagnosis not present

## 2019-02-18 DIAGNOSIS — I10 Essential (primary) hypertension: Secondary | ICD-10-CM | POA: Diagnosis not present

## 2019-02-18 DIAGNOSIS — E1143 Type 2 diabetes mellitus with diabetic autonomic (poly)neuropathy: Secondary | ICD-10-CM | POA: Diagnosis not present

## 2019-02-18 DIAGNOSIS — J449 Chronic obstructive pulmonary disease, unspecified: Secondary | ICD-10-CM | POA: Diagnosis not present

## 2019-02-21 DIAGNOSIS — G4733 Obstructive sleep apnea (adult) (pediatric): Secondary | ICD-10-CM | POA: Diagnosis not present

## 2019-02-27 DIAGNOSIS — G4733 Obstructive sleep apnea (adult) (pediatric): Secondary | ICD-10-CM | POA: Diagnosis not present

## 2019-03-06 ENCOUNTER — Other Ambulatory Visit: Payer: Self-pay | Admitting: Student

## 2019-03-12 DIAGNOSIS — R1010 Upper abdominal pain, unspecified: Secondary | ICD-10-CM | POA: Diagnosis not present

## 2019-03-12 DIAGNOSIS — Z6827 Body mass index (BMI) 27.0-27.9, adult: Secondary | ICD-10-CM | POA: Diagnosis not present

## 2019-03-18 DIAGNOSIS — Z90710 Acquired absence of both cervix and uterus: Secondary | ICD-10-CM | POA: Diagnosis not present

## 2019-03-18 DIAGNOSIS — R1010 Upper abdominal pain, unspecified: Secondary | ICD-10-CM | POA: Diagnosis not present

## 2019-03-18 DIAGNOSIS — K439 Ventral hernia without obstruction or gangrene: Secondary | ICD-10-CM | POA: Diagnosis not present

## 2019-03-19 DIAGNOSIS — E113313 Type 2 diabetes mellitus with moderate nonproliferative diabetic retinopathy with macular edema, bilateral: Secondary | ICD-10-CM | POA: Diagnosis not present

## 2019-03-19 DIAGNOSIS — E113311 Type 2 diabetes mellitus with moderate nonproliferative diabetic retinopathy with macular edema, right eye: Secondary | ICD-10-CM | POA: Diagnosis not present

## 2019-03-19 DIAGNOSIS — E113312 Type 2 diabetes mellitus with moderate nonproliferative diabetic retinopathy with macular edema, left eye: Secondary | ICD-10-CM | POA: Diagnosis not present

## 2019-03-24 DIAGNOSIS — G4733 Obstructive sleep apnea (adult) (pediatric): Secondary | ICD-10-CM | POA: Diagnosis not present

## 2019-03-25 DIAGNOSIS — I10 Essential (primary) hypertension: Secondary | ICD-10-CM | POA: Diagnosis not present

## 2019-03-25 DIAGNOSIS — E1165 Type 2 diabetes mellitus with hyperglycemia: Secondary | ICD-10-CM | POA: Diagnosis not present

## 2019-03-25 DIAGNOSIS — J449 Chronic obstructive pulmonary disease, unspecified: Secondary | ICD-10-CM | POA: Diagnosis not present

## 2019-04-09 DIAGNOSIS — J449 Chronic obstructive pulmonary disease, unspecified: Secondary | ICD-10-CM | POA: Diagnosis not present

## 2019-04-09 DIAGNOSIS — Z6827 Body mass index (BMI) 27.0-27.9, adult: Secondary | ICD-10-CM | POA: Diagnosis not present

## 2019-04-09 DIAGNOSIS — J309 Allergic rhinitis, unspecified: Secondary | ICD-10-CM | POA: Diagnosis not present

## 2019-04-09 DIAGNOSIS — Z0000 Encounter for general adult medical examination without abnormal findings: Secondary | ICD-10-CM | POA: Diagnosis not present

## 2019-04-09 DIAGNOSIS — E1165 Type 2 diabetes mellitus with hyperglycemia: Secondary | ICD-10-CM | POA: Diagnosis not present

## 2019-04-09 DIAGNOSIS — I10 Essential (primary) hypertension: Secondary | ICD-10-CM | POA: Diagnosis not present

## 2019-04-16 ENCOUNTER — Other Ambulatory Visit: Payer: Self-pay | Admitting: Internal Medicine

## 2019-04-16 DIAGNOSIS — Z1231 Encounter for screening mammogram for malignant neoplasm of breast: Secondary | ICD-10-CM

## 2019-04-23 DIAGNOSIS — E113311 Type 2 diabetes mellitus with moderate nonproliferative diabetic retinopathy with macular edema, right eye: Secondary | ICD-10-CM | POA: Diagnosis not present

## 2019-04-23 DIAGNOSIS — E113313 Type 2 diabetes mellitus with moderate nonproliferative diabetic retinopathy with macular edema, bilateral: Secondary | ICD-10-CM | POA: Diagnosis not present

## 2019-04-23 DIAGNOSIS — E113312 Type 2 diabetes mellitus with moderate nonproliferative diabetic retinopathy with macular edema, left eye: Secondary | ICD-10-CM | POA: Diagnosis not present

## 2019-04-24 DIAGNOSIS — G4733 Obstructive sleep apnea (adult) (pediatric): Secondary | ICD-10-CM | POA: Diagnosis not present

## 2019-05-13 ENCOUNTER — Encounter: Payer: Self-pay | Admitting: Gynecology

## 2019-05-24 DIAGNOSIS — G4733 Obstructive sleep apnea (adult) (pediatric): Secondary | ICD-10-CM | POA: Diagnosis not present

## 2019-05-28 DIAGNOSIS — E113312 Type 2 diabetes mellitus with moderate nonproliferative diabetic retinopathy with macular edema, left eye: Secondary | ICD-10-CM | POA: Diagnosis not present

## 2019-05-28 DIAGNOSIS — E113313 Type 2 diabetes mellitus with moderate nonproliferative diabetic retinopathy with macular edema, bilateral: Secondary | ICD-10-CM | POA: Diagnosis not present

## 2019-05-28 DIAGNOSIS — E113311 Type 2 diabetes mellitus with moderate nonproliferative diabetic retinopathy with macular edema, right eye: Secondary | ICD-10-CM | POA: Diagnosis not present

## 2019-05-29 ENCOUNTER — Other Ambulatory Visit: Payer: Self-pay

## 2019-05-29 ENCOUNTER — Ambulatory Visit
Admission: RE | Admit: 2019-05-29 | Discharge: 2019-05-29 | Disposition: A | Discharge status: Home / Self Care | Payer: Medicare HMO | Source: Ambulatory Visit | Attending: Internal Medicine | Admitting: Internal Medicine

## 2019-05-29 DIAGNOSIS — Z1231 Encounter for screening mammogram for malignant neoplasm of breast: Secondary | ICD-10-CM

## 2019-06-01 DIAGNOSIS — G4733 Obstructive sleep apnea (adult) (pediatric): Secondary | ICD-10-CM | POA: Diagnosis not present

## 2019-06-03 DIAGNOSIS — J449 Chronic obstructive pulmonary disease, unspecified: Secondary | ICD-10-CM | POA: Diagnosis not present

## 2019-06-03 DIAGNOSIS — E1165 Type 2 diabetes mellitus with hyperglycemia: Secondary | ICD-10-CM | POA: Diagnosis not present

## 2019-06-03 DIAGNOSIS — I10 Essential (primary) hypertension: Secondary | ICD-10-CM | POA: Diagnosis not present

## 2019-06-11 DIAGNOSIS — Z13820 Encounter for screening for osteoporosis: Secondary | ICD-10-CM | POA: Diagnosis not present

## 2019-06-11 DIAGNOSIS — M8588 Other specified disorders of bone density and structure, other site: Secondary | ICD-10-CM | POA: Diagnosis not present

## 2019-06-11 DIAGNOSIS — M810 Age-related osteoporosis without current pathological fracture: Secondary | ICD-10-CM | POA: Diagnosis not present

## 2019-06-11 DIAGNOSIS — M85851 Other specified disorders of bone density and structure, right thigh: Secondary | ICD-10-CM | POA: Diagnosis not present

## 2019-06-24 DIAGNOSIS — G4733 Obstructive sleep apnea (adult) (pediatric): Secondary | ICD-10-CM | POA: Diagnosis not present

## 2019-07-03 DIAGNOSIS — J449 Chronic obstructive pulmonary disease, unspecified: Secondary | ICD-10-CM | POA: Diagnosis not present

## 2019-07-03 DIAGNOSIS — I10 Essential (primary) hypertension: Secondary | ICD-10-CM | POA: Diagnosis not present

## 2019-07-03 DIAGNOSIS — E1165 Type 2 diabetes mellitus with hyperglycemia: Secondary | ICD-10-CM | POA: Diagnosis not present

## 2019-07-13 DIAGNOSIS — Z6827 Body mass index (BMI) 27.0-27.9, adult: Secondary | ICD-10-CM | POA: Diagnosis not present

## 2019-07-13 DIAGNOSIS — J309 Allergic rhinitis, unspecified: Secondary | ICD-10-CM | POA: Diagnosis not present

## 2019-07-13 DIAGNOSIS — I10 Essential (primary) hypertension: Secondary | ICD-10-CM | POA: Diagnosis not present

## 2019-07-13 DIAGNOSIS — J449 Chronic obstructive pulmonary disease, unspecified: Secondary | ICD-10-CM | POA: Diagnosis not present

## 2019-07-13 DIAGNOSIS — E1165 Type 2 diabetes mellitus with hyperglycemia: Secondary | ICD-10-CM | POA: Diagnosis not present

## 2019-07-15 DIAGNOSIS — R6889 Other general symptoms and signs: Secondary | ICD-10-CM | POA: Diagnosis not present

## 2019-07-15 DIAGNOSIS — E113313 Type 2 diabetes mellitus with moderate nonproliferative diabetic retinopathy with macular edema, bilateral: Secondary | ICD-10-CM | POA: Diagnosis not present

## 2019-07-23 DIAGNOSIS — F339 Major depressive disorder, recurrent, unspecified: Secondary | ICD-10-CM | POA: Diagnosis not present

## 2019-07-24 DIAGNOSIS — J449 Chronic obstructive pulmonary disease, unspecified: Secondary | ICD-10-CM | POA: Diagnosis not present

## 2019-07-24 DIAGNOSIS — E1165 Type 2 diabetes mellitus with hyperglycemia: Secondary | ICD-10-CM | POA: Diagnosis not present

## 2019-07-24 DIAGNOSIS — G4733 Obstructive sleep apnea (adult) (pediatric): Secondary | ICD-10-CM | POA: Diagnosis not present

## 2019-07-24 DIAGNOSIS — I10 Essential (primary) hypertension: Secondary | ICD-10-CM | POA: Diagnosis not present

## 2019-08-10 DIAGNOSIS — Z6826 Body mass index (BMI) 26.0-26.9, adult: Secondary | ICD-10-CM | POA: Diagnosis not present

## 2019-08-10 DIAGNOSIS — J309 Allergic rhinitis, unspecified: Secondary | ICD-10-CM | POA: Diagnosis not present

## 2019-08-17 ENCOUNTER — Other Ambulatory Visit: Payer: Self-pay | Admitting: Student

## 2019-08-25 DIAGNOSIS — J449 Chronic obstructive pulmonary disease, unspecified: Secondary | ICD-10-CM | POA: Diagnosis not present

## 2019-08-25 DIAGNOSIS — I10 Essential (primary) hypertension: Secondary | ICD-10-CM | POA: Diagnosis not present

## 2019-08-25 DIAGNOSIS — E1165 Type 2 diabetes mellitus with hyperglycemia: Secondary | ICD-10-CM | POA: Diagnosis not present

## 2019-08-27 DIAGNOSIS — R6889 Other general symptoms and signs: Secondary | ICD-10-CM | POA: Diagnosis not present

## 2019-08-27 DIAGNOSIS — E113313 Type 2 diabetes mellitus with moderate nonproliferative diabetic retinopathy with macular edema, bilateral: Secondary | ICD-10-CM | POA: Diagnosis not present

## 2019-08-30 NOTE — Progress Notes (Signed)
Cardiology Office Note  Date: 08/31/2019   ID: Amy Cobb, DOB 1960/11/11, MRN [id number]  PCP:  Neale Burly, MD  Cardiologist:  Rozann Lesches, MD Electrophysiologist:  None   Chief Complaint  Patient presents with  . Follow-up blood pressure    History of Present Illness: Amy Cobb is a 59 y.o. female last seen in February 2020.  She presents for a follow-up visit.  She states that she has been compliant with her medications, has had intermittent elevations in blood pressure at rest.  She has been compliant with wearing a mask, does feel somewhat anxious and thinks that this might be playing a role as well.  I reviewed recent home blood pressures as follows: 147/85, 149/83, 156/92, 127/80. 171/82, 139/83, 188/96.  Follow-up echocardiogram obtained in March 2020 showed LVEF 60 to 65% with mild diastolic dysfunction and no major valvular abnormalities.  We went over her medications and plan to increase dose of labetalol for now.  Past Medical History:  Diagnosis Date  . Asthma   . COPD (chronic obstructive pulmonary disease) ([place])   . Depression   . Essential hypertension   . Type 2 diabetes mellitus ([place])   . Vulvar cancer ([place]) 2003    Past Surgical History:  Procedure Laterality Date  . ABDOMINAL HYSTERECTOMY    . BREAST CYST EXCISION Right   . CERVICAL FUSION    . CO2 LASER APPLICATION  123456   CO2 LASER VAPORIZATION OF THE VULVAR LESION  . SHOULDER SURGERY      Current Outpatient Medications  Medication Sig Dispense Refill  . Aflibercept (EYLEA) 2 MG/0.05ML SOLN by Intravitreal route.    Marland Kitchen albuterol (PROVENTIL HFA;VENTOLIN HFA) 108 (90 BASE) MCG/ACT inhaler Inhale 2 puffs into the lungs every 6 (six) hours as needed for wheezing or shortness of breath.    Marland Kitchen albuterol (PROVENTIL) (2.5 MG/3ML) 0.083% nebulizer solution Take 2.5 mg by nebulization every 8 (eight) hours as needed for wheezing or shortness of breath.     .  budesonide-formoterol (SYMBICORT) 160-4.5 MCG/ACT inhaler Inhale 2 puffs into the lungs 2 (two) times daily.    . busPIRone (BUSPAR) 15 MG tablet Take 15 mg by mouth 3 (three) times daily.    . chlorthalidone (HYGROTON) 25 MG tablet Take 25 mg by mouth daily.    . cloNIDine (CATAPRES) 0.2 MG tablet Take 1 tablet (0.2 mg total) by mouth 3 (three) times daily.    . Fish Oil-Cholecalciferol (FISH OIL + D3) 1000-1000 MG-UNIT CAPS Take 1,000 mg by mouth 3 (three) times daily.    Marland Kitchen FLUoxetine (PROZAC) 40 MG capsule Take 40 mg by mouth daily.    . hydrALAZINE (APRESOLINE) 50 MG tablet TAKE 1 TABLET (50 MG TOTAL) BY MOUTH 3 (THREE) TIMES DAILY. 270 tablet 3  . insulin glargine (LANTUS) 100 UNIT/ML injection Inject 50 Units into the skin 2 (two) times daily.    . Insulin Lispro (ADMELOG SOLOSTAR [place]) Inject 15 Units into the skin 3 (three) times daily.    Marland Kitchen labetalol (NORMODYNE) 200 MG tablet Take 2 tablets (400 mg total) by mouth 2 (two) times daily. 360 tablet 3  . losartan (COZAAR) 100 MG tablet TAKE 1 TABLET EVERY DAY 90 tablet 3  . meclizine (ANTIVERT) 25 MG tablet Take 25 mg by mouth 3 (three) times daily as needed for dizziness.    . sitaGLIPtan-metformin (JANUMET) 50-1000 MG per tablet Take 1 tablet by mouth 2 (two) times daily with a meal.    .  tiotropium (SPIRIVA) 18 MCG inhalation capsule Place 18 mcg into inhaler and inhale daily.     No current facility-administered medications for this visit.   Allergies:  Augmentin [amoxicillin-pot clavulanate], Penicillins, Gabapentin, Moxifloxacin, and Novolog [insulin aspart]   Social History: The patient  reports that she quit smoking about 10 years ago. She quit smokeless tobacco use about 9 years ago. She reports previous alcohol use. She reports that she does not use drugs.   ROS:  Please see the history of present illness. Otherwise, complete review of systems is positive for none.  All other systems are reviewed and negative.   Physical Exam:  VS:  BP (!) 160/84   Pulse 87   Temp (!) 96.8 F (36 C)   Ht 5\' 7"  (1.702 m)   Wt 167 lb (75.8 kg)   SpO2 99%   BMI 26.16 kg/m , BMI Body mass index is 26.16 kg/m.  Wt Readings from Last 3 Encounters:  08/31/19 167 lb (75.8 kg)  10/14/18 174 lb (78.9 kg)  03/11/18 184 lb (83.5 kg)    General: Patient appears comfortable at rest. HEENT: Conjunctiva and lids normal, wearing a mask. Neck: Supple, no elevated JVP or carotid bruits, no thyromegaly. Lungs: Clear to auscultation, nonlabored breathing at rest. Cardiac: Regular rate and rhythm, no S3 or significant systolic murmur, no pericardial rub. Abdomen: Soft, nontender, bowel sounds present. Extremities: No pitting edema, distal pulses 2+. Skin: Warm and dry. Musculoskeletal: No kyphosis. Neuropsychiatric: Alert and oriented x3, affect grossly appropriate.  ECG:  An ECG dated 12/18/2017 was personally reviewed today and demonstrated:  Sinus rhythm with nonspecific ST-T changes.  Recent Labwork:  June 2019: Potassium 3.3, BUN 19, creatinine 0.86  Other Studies Reviewed Today:  Echocardiogram 10/24/2018:  1. The left ventricle has normal systolic function with an ejection fraction of 60-65%. The cavity size was normal. There is mildly increased left ventricular wall thickness. Left ventricular diastolic Doppler parameters are consistent with impaired  relaxation.  2. The right ventricle has normal systolic function. The cavity was normal. There is no increase in right ventricular wall thickness.  3. The mitral valve is normal in structure. No evidence of mitral valve stenosis.  4. The tricuspid valve is normal in structure.  5. The aortic valve is tricuspid.  6. The aortic root is normal in size and structure.  Assessment and Plan:  1.  Hypertension with hypertensive heart disease and no active heart failure.  Echocardiogram shows LVEF 60 to 65% with mild LVH and mild diastolic dysfunction.  Blood pressure has been elevated  recently on multimodal therapy.  Plan to increase labetalol to 400 mg twice daily, otherwise continue present regimen.  She will continue to track blood pressure at home.  Also reinforced diet and salt intake guidelines.  2.  COPD with previous history of tobacco abuse.  Medication Adjustments/Labs and Tests Ordered: Current medicines are reviewed at length with the patient today.  Concerns regarding medicines are outlined above.   Tests Ordered: No orders of the defined types were placed in this encounter.   Medication Changes: Meds ordered this encounter  Medications  . labetalol (NORMODYNE) 200 MG tablet    Sig: Take 2 tablets (400 mg total) by mouth 2 (two) times daily.    Dispense:  360 tablet    Refill:  3    08/21/2019 dose INCREASED to 400 mg BID    Disposition:  Follow up 4 weeks in the [place] office with [place].  Signed, Aloha Gell  Domenic Polite, MD, [facility] 08/31/2019 10:18 AM    [facility] at [facility] [address] [place], [place] [address] Phone: [phone]; Fax: [phone]

## 2019-08-31 ENCOUNTER — Ambulatory Visit: Payer: Medicare HMO | Admitting: Cardiology

## 2019-08-31 ENCOUNTER — Encounter: Payer: Self-pay | Admitting: Cardiology

## 2019-08-31 ENCOUNTER — Other Ambulatory Visit: Payer: Self-pay

## 2019-08-31 VITALS — BP 160/84 | HR 87 | Temp 96.8°F | Ht 67.0 in | Wt 167.0 lb

## 2019-08-31 DIAGNOSIS — I10 Essential (primary) hypertension: Secondary | ICD-10-CM | POA: Diagnosis not present

## 2019-08-31 DIAGNOSIS — J449 Chronic obstructive pulmonary disease, unspecified: Secondary | ICD-10-CM | POA: Diagnosis not present

## 2019-08-31 MED ORDER — LABETALOL HCL 200 MG PO TABS: 400.0000 mg | ORAL_TABLET | Freq: Two times a day (BID) | ORAL | 3 refills | Status: DC

## 2019-08-31 NOTE — Patient Instructions (Signed)
Medication Instructions:  INCREASE Labetalol to 400 mg twice a day  *If you need a refill on your cardiac medications before your next appointment, please call your pharmacy*  Lab Work: NONE If you have labs (blood work) drawn today and your tests are completely normal, you will receive your results only by: Marland Kitchen MyChart Message (if you have MyChart) OR . A paper copy in the mail If you have any lab test that is abnormal or we need to change your treatment, we will call you to review the results.  Testing/Procedures: NONE  Follow-Up: At [facility], you and your health needs are our priority.  As part of our continuing mission to provide you with exceptional heart care, we have created designated Provider Care Teams.  These Care Teams include your primary Cardiologist (physician) and Advanced Practice Providers (APPs -  Physician Assistants and Nurse Practitioners) who all work together to provide you with the care you need, when you need it.  Your next appointment:   4 week(s)  The format for your next appointment:   In Person  Provider:   Bernerd Pho, PA-C  Other Instructions NONe          Thank you for choosing [facility] !

## 2019-09-01 DIAGNOSIS — G4733 Obstructive sleep apnea (adult) (pediatric): Secondary | ICD-10-CM | POA: Diagnosis not present

## 2019-09-15 DIAGNOSIS — G4733 Obstructive sleep apnea (adult) (pediatric): Secondary | ICD-10-CM | POA: Diagnosis not present

## 2019-09-21 DIAGNOSIS — B370 Candidal stomatitis: Secondary | ICD-10-CM | POA: Diagnosis not present

## 2019-09-21 DIAGNOSIS — Z1159 Encounter for screening for other viral diseases: Secondary | ICD-10-CM | POA: Diagnosis not present

## 2019-09-21 DIAGNOSIS — Z6826 Body mass index (BMI) 26.0-26.9, adult: Secondary | ICD-10-CM | POA: Diagnosis not present

## 2019-09-21 DIAGNOSIS — J309 Allergic rhinitis, unspecified: Secondary | ICD-10-CM | POA: Diagnosis not present

## 2019-09-21 DIAGNOSIS — I10 Essential (primary) hypertension: Secondary | ICD-10-CM | POA: Diagnosis not present

## 2019-09-21 DIAGNOSIS — E1165 Type 2 diabetes mellitus with hyperglycemia: Secondary | ICD-10-CM | POA: Diagnosis not present

## 2019-09-22 ENCOUNTER — Telehealth: Payer: Self-pay | Admitting: Cardiology

## 2019-09-22 MED ORDER — LABETALOL HCL 200 MG PO TABS: 400.0000 mg | ORAL_TABLET | Freq: Two times a day (BID) | ORAL | 0 refills | Status: DC

## 2019-09-22 MED ORDER — LABETALOL HCL 200 MG PO TABS: 400.0000 mg | ORAL_TABLET | Freq: Two times a day (BID) | ORAL | 3 refills | Status: DC

## 2019-09-22 NOTE — Telephone Encounter (Signed)
Pt's labetalol (NORMODYNE) 200 MG tablet [id number]  Was to be sent to [facility] called stating that she went to [facility] and they didn't have this Rx for her  Please give pt a call [phone]

## 2019-09-22 NOTE — Telephone Encounter (Signed)
Refilled to [facility] mail order, plus 1 week supply to [facility]

## 2019-09-30 ENCOUNTER — Ambulatory Visit: Payer: Medicare HMO | Admitting: Student

## 2019-09-30 ENCOUNTER — Encounter: Payer: Medicare HMO | Admitting: Obstetrics & Gynecology

## 2019-10-01 DIAGNOSIS — E1165 Type 2 diabetes mellitus with hyperglycemia: Secondary | ICD-10-CM | POA: Diagnosis not present

## 2019-10-01 DIAGNOSIS — I10 Essential (primary) hypertension: Secondary | ICD-10-CM | POA: Diagnosis not present

## 2019-10-14 ENCOUNTER — Other Ambulatory Visit: Payer: Self-pay

## 2019-10-15 ENCOUNTER — Ambulatory Visit (INDEPENDENT_AMBULATORY_CARE_PROVIDER_SITE_OTHER): Payer: Medicare HMO | Admitting: Obstetrics & Gynecology

## 2019-10-15 ENCOUNTER — Encounter: Payer: Self-pay | Admitting: Obstetrics & Gynecology

## 2019-10-15 VITALS — BP 128/76 | Ht 66.0 in | Wt 165.0 lb

## 2019-10-15 DIAGNOSIS — Z8544 Personal history of malignant neoplasm of other female genital organs: Secondary | ICD-10-CM

## 2019-10-15 DIAGNOSIS — Z01419 Encounter for gynecological examination (general) (routine) without abnormal findings: Secondary | ICD-10-CM | POA: Diagnosis not present

## 2019-10-15 DIAGNOSIS — Z90710 Acquired absence of both cervix and uterus: Secondary | ICD-10-CM

## 2019-10-15 DIAGNOSIS — Z1272 Encounter for screening for malignant neoplasm of vagina: Secondary | ICD-10-CM

## 2019-10-15 DIAGNOSIS — N951 Menopausal and female climacteric states: Secondary | ICD-10-CM | POA: Diagnosis not present

## 2019-10-15 DIAGNOSIS — Z9289 Personal history of other medical treatment: Secondary | ICD-10-CM | POA: Diagnosis not present

## 2019-10-15 MED ORDER — ESTROGENS CONJUGATED 0.3 MG PO TABS: 0.3000 mg | ORAL_TABLET | Freq: Every day | ORAL | 4 refills | Status: DC

## 2019-10-15 NOTE — Progress Notes (Signed)
Amy Cobb 03/01/1961 [id number]   History:    59 y.o. G0 Stable same sex partner x 17 yrs  RP:  Established patient presenting for annual gyn exam   HPI: TAH for Fibroids in her 61's.  Menopause with worsening hot flashes and night sweats.  Difficulty sleeping because of night sweats.  Would like to start on hormone replacement therapy.  No pelvic pain.  History of CIS of the vulva treated with CO2 laser in 2003.  No recurrence since then.  Urine and bowel movements normal.  Breasts normal.  Body mass index now at 26.63.  Health labs with family physician.  Chronic hypertension on medication as well as diabetes mellitus treated with insulin.  Past medical history,surgical history, family history and social history were all reviewed and documented in the EPIC chart.  Gynecologic History No LMP recorded. Patient has had a hysterectomy.  Obstetric History OB History  Gravida Para Term Preterm AB Living  0 0 0 0 0 0  SAB TAB Ectopic Multiple Live Births  0 0 0 0 0     ROS: A ROS was performed and pertinent positives and negatives are included in the history.  GENERAL: No fevers or chills. HEENT: No change in vision, no earache, sore throat or sinus congestion. NECK: No pain or stiffness. CARDIOVASCULAR: No chest pain or pressure. No palpitations. PULMONARY: No shortness of breath, cough or wheeze. GASTROINTESTINAL: No abdominal pain, nausea, vomiting or diarrhea, melena or bright red blood per rectum. GENITOURINARY: No urinary frequency, urgency, hesitancy or dysuria. MUSCULOSKELETAL: No joint or muscle pain, no back pain, no recent trauma. DERMATOLOGIC: No rash, no itching, no lesions. ENDOCRINE: No polyuria, polydipsia, no heat or cold intolerance. No recent change in weight. HEMATOLOGICAL: No anemia or easy bruising or bleeding. NEUROLOGIC: No headache, seizures, numbness, tingling or weakness. PSYCHIATRIC: No depression, no loss of interest in normal activity or change in  sleep pattern.     Exam:     10/15/19 10:33 AM   BP  128/76   Weight  165lb(74.8kg)   Height  5'6"(1.674m)   Other Vitals  BMI 26.63 kg/m2  BSA 1.87 m2      General appearance : Well developed well nourished female. No acute distress HEENT: Eyes: no retinal hemorrhage or exudates,  Neck supple, trachea midline, no carotid bruits, no thyroidmegaly Lungs: Clear to auscultation, no rhonchi or wheezes, or rib retractions  Heart: Regular rate and rhythm, no murmurs or gallops Breast:Examined in sitting and supine position were symmetrical in appearance, no palpable masses or tenderness,  no skin retraction, no nipple inversion, no nipple discharge, no skin discoloration, no axillary or supraclavicular lymphadenopathy Abdomen: no palpable masses or tenderness, no rebound or guarding Extremities: no edema or skin discoloration or tenderness  Pelvic: Vulva: Normal             Vagina: No gross lesions or discharge.  Pap reflex done.  Cervix/Uterus absent  Adnexa  Without masses or tenderness  Anus: Normal   Assessment/Plan:  59 y.o. female for annual exam   1. Encounter for Papanicolaou smear of vagina as part of routine gynecological examination Gynecologic exam status post total abdominal hysterectomy.  History of CIS of the vulva.  Pap test done at the vaginal vault.  Breast exam normal.  Mammogram October 2020 was negative.  Colonoscopy 2018.  Health labs with family physician.  Body mass index 26.63.  Recommend aerobic activities 5 times a week and light weightlifting every 2 days.  Healthy nutrition.  2. S/P TAH (total abdominal hysterectomy)  3. Menopause syndrome Symptomatic menopause status post total hysterectomy.  Decision to start on Premarin 0.3 mg/tab.  1 tablet per mouth daily.  Usage, risks and benefits thoroughly reviewed.  No contraindication.  Prescription sent to pharmacy.  Other orders - estrogens, conjugated, (PREMARIN) 0.3 MG tablet; Take 1 tablet (0.3  mg total) by mouth daily. Take daily for 21 days then do not take for 7 days.  [name], 10:21 AM 10/15/2019

## 2019-10-16 ENCOUNTER — Telehealth: Payer: Self-pay | Admitting: *Deleted

## 2019-10-16 LAB — PAP IG W/ RFLX HPV ASCU

## 2019-10-16 MED ORDER — ESTROGENS CONJUGATED 0.3 MG PO TABS: 0.3000 mg | ORAL_TABLET | Freq: Every day | ORAL | 4 refills | Status: DC

## 2019-10-16 NOTE — Telephone Encounter (Signed)
Patient called requesting premarin 0.3 mg tablet sent to [facility]. Rx sent.

## 2019-10-17 ENCOUNTER — Encounter: Payer: Self-pay | Admitting: Obstetrics & Gynecology

## 2019-10-17 NOTE — Patient Instructions (Signed)
1. Encounter for Papanicolaou smear of vagina as part of routine gynecological examination Gynecologic exam status post total abdominal hysterectomy.  History of CIS of the vulva.  Pap test done at the vaginal vault.  Breast exam normal.  Mammogram October 2020 was negative.  Colonoscopy 2018.  Health labs with family physician.  Body mass index 26.63.  Recommend aerobic activities 5 times a week and light weightlifting every 2 days.  Healthy nutrition.  2. S/P TAH (total abdominal hysterectomy)  3. Menopause syndrome Symptomatic menopause status post total hysterectomy.  Decision to start on Premarin 0.3 mg/tab.  1 tablet per mouth daily.  Usage, risks and benefits thoroughly reviewed.  No contraindication.  Prescription sent to pharmacy.  Other orders - estrogens, conjugated, (PREMARIN) 0.3 MG tablet; Take 1 tablet (0.3 mg total) by mouth daily. Take daily for 21 days then do not take for 7 days.  Amy Cobb, it was a pleasure seeing you today!  I will inform you of your results as soon as they are available.

## 2019-10-22 DIAGNOSIS — H5319 Other subjective visual disturbances: Secondary | ICD-10-CM | POA: Diagnosis not present

## 2019-10-22 DIAGNOSIS — E113313 Type 2 diabetes mellitus with moderate nonproliferative diabetic retinopathy with macular edema, bilateral: Secondary | ICD-10-CM | POA: Diagnosis not present

## 2019-10-22 DIAGNOSIS — R6889 Other general symptoms and signs: Secondary | ICD-10-CM | POA: Diagnosis not present

## 2019-10-22 DIAGNOSIS — H04123 Dry eye syndrome of bilateral lacrimal glands: Secondary | ICD-10-CM | POA: Diagnosis not present

## 2019-10-28 DIAGNOSIS — I10 Essential (primary) hypertension: Secondary | ICD-10-CM | POA: Diagnosis not present

## 2019-10-28 DIAGNOSIS — E1165 Type 2 diabetes mellitus with hyperglycemia: Secondary | ICD-10-CM | POA: Diagnosis not present

## 2019-10-29 ENCOUNTER — Telehealth: Payer: Self-pay | Admitting: Cardiology

## 2019-10-29 ENCOUNTER — Ambulatory Visit: Payer: Medicare HMO | Admitting: Student

## 2019-10-29 ENCOUNTER — Encounter: Payer: Self-pay | Admitting: Student

## 2019-10-29 ENCOUNTER — Other Ambulatory Visit: Payer: Self-pay

## 2019-10-29 VITALS — BP 132/74 | HR 76 | Temp 99.1°F | Ht 66.0 in | Wt 166.8 lb

## 2019-10-29 DIAGNOSIS — Z794 Long term (current) use of insulin: Secondary | ICD-10-CM

## 2019-10-29 DIAGNOSIS — E118 Type 2 diabetes mellitus with unspecified complications: Secondary | ICD-10-CM | POA: Diagnosis not present

## 2019-10-29 DIAGNOSIS — I10 Essential (primary) hypertension: Secondary | ICD-10-CM | POA: Diagnosis not present

## 2019-10-29 DIAGNOSIS — I119 Hypertensive heart disease without heart failure: Secondary | ICD-10-CM | POA: Diagnosis not present

## 2019-10-29 NOTE — Telephone Encounter (Signed)

## 2019-10-29 NOTE — Progress Notes (Signed)
Cardiology Office Note    Date:  10/29/2019   ID:  Amy Cobb, DOB [birth year], MRN [id number]  PCP:  Neale Burly, MD  Cardiologist: Rozann Lesches, MD    Chief Complaint  Patient presents with  . Follow-up    2 month visit    History of Present Illness:    Amy Cobb is a 59 y.o. female with past medical history of HTN, Type 2 DM, and COPD who presents to the office today for 30-month follow-up.   She was last examined by Dr. Domenic Polite in 08/2019 and reported having elevated BP when checked at home. Denied any chest pain or palpitations. She was continued on Chlorthalidone 25mg  daily, Clonidine 0.2mg  TID, Hydralazine 50mg  TID and Losartan 100mg  daily. Labetalol was increased to 400mg  BID.  In talking with the patient today, she reports overall doing well since her last visit. She denies any recent chest pain, dyspnea on exertion, orthopnea, PND, lower extremity edema or palpitations.   She has been checking her blood pressure regularly at home and reports her SBP is typically in the 120's to 130's. She does report occasional episodes of "not feeling right" and SBP has been in the 110's at those times. Denies any specific dizziness or presyncope.  Past Medical History:  Diagnosis Date  . Asthma   . COPD (chronic obstructive pulmonary disease) ([place])   . Depression   . Essential hypertension   . Type 2 diabetes mellitus ([place])   . Vulvar cancer ([place]) 2003    Past Surgical History:  Procedure Laterality Date  . ABDOMINAL HYSTERECTOMY    . BREAST CYST EXCISION Right   . CERVICAL FUSION    . CO2 LASER APPLICATION  123456   CO2 LASER VAPORIZATION OF THE VULVAR LESION  . SHOULDER SURGERY      Current Medications: Outpatient Medications Prior to Visit  Medication Sig Dispense Refill  . Aflibercept (EYLEA) 2 MG/0.05ML SOLN by Intravitreal route.    Marland Kitchen albuterol (PROVENTIL HFA;VENTOLIN HFA) 108 (90 BASE) MCG/ACT inhaler Inhale 2 puffs into the lungs  every 6 (six) hours as needed for wheezing or shortness of breath.    Marland Kitchen albuterol (PROVENTIL) (2.5 MG/3ML) 0.083% nebulizer solution Take 2.5 mg by nebulization every 8 (eight) hours as needed for wheezing or shortness of breath.     . budesonide-formoterol (SYMBICORT) 160-4.5 MCG/ACT inhaler Inhale 2 puffs into the lungs 2 (two) times daily.    . busPIRone (BUSPAR) 15 MG tablet Take 15 mg by mouth 3 (three) times daily.    . chlorthalidone (HYGROTON) 25 MG tablet Take 25 mg by mouth daily.    . cloNIDine (CATAPRES) 0.2 MG tablet Take 1 tablet (0.2 mg total) by mouth 3 (three) times daily.    Marland Kitchen estrogens, conjugated, (PREMARIN) 0.3 MG tablet Take 1 tablet (0.3 mg total) by mouth daily. 90 tablet 4  . Fish Oil-Cholecalciferol (FISH OIL + D3) 1000-1000 MG-UNIT CAPS Take 1,000 mg by mouth 3 (three) times daily.    Marland Kitchen FLUoxetine (PROZAC) 40 MG capsule Take 40 mg by mouth daily.    . hydrALAZINE (APRESOLINE) 50 MG tablet TAKE 1 TABLET (50 MG TOTAL) BY MOUTH 3 (THREE) TIMES DAILY. 270 tablet 3  . insulin glargine (LANTUS) 100 UNIT/ML injection Inject 50 Units into the skin 2 (two) times daily.    . Insulin Lispro (ADMELOG SOLOSTAR [place]) Inject 15 Units into the skin 3 (three) times daily.    Marland Kitchen labetalol (NORMODYNE) 200 MG tablet Take 2  tablets (400 mg total) by mouth 2 (two) times daily. 360 tablet 3  . losartan (COZAAR) 100 MG tablet TAKE 1 TABLET EVERY DAY 90 tablet 3  . meclizine (ANTIVERT) 25 MG tablet Take 25 mg by mouth 3 (three) times daily as needed for dizziness.    . sitaGLIPtan-metformin (JANUMET) 50-1000 MG per tablet Take 1 tablet by mouth 2 (two) times daily with a meal.    . tiotropium (SPIRIVA) 18 MCG inhalation capsule Place 18 mcg into inhaler and inhale daily.     No facility-administered medications prior to visit.     Allergies:   Augmentin [amoxicillin-pot clavulanate], Penicillins, Gabapentin, Moxifloxacin, and Novolog [insulin aspart]   Social History   Socioeconomic History   . Marital status: Single    Spouse name: Not on file  . Number of children: Not on file  . Years of education: Not on file  . Highest education level: Not on file  Occupational History  . Not on file  Tobacco Use  . Smoking status: Former Smoker    Quit date: 12/25/2008    Years since quitting: 10.8  . Smokeless tobacco: Former Systems developer    Quit date: 12/17/2009  Substance and Sexual Activity  . Alcohol use: Not Currently    Comment: Occasional  . Drug use: No  . Sexual activity: Yes    Partners: Male    Comment: 1st intercourse- 8, partners- (42 female) (100 female),  married- 49 yrs   Other Topics Concern  . Not on file  Social History Narrative  . Not on file   Social Determinants of Health   Financial Resource Strain:   . Difficulty of Paying Living Expenses:   Food Insecurity:   . Worried About Charity fundraiser in the Last Year:   . Arboriculturist in the Last Year:   Transportation Needs:   . Film/video editor (Medical):   Marland Kitchen Lack of Transportation (Non-Medical):   Physical Activity:   . Days of Exercise per Week:   . Minutes of Exercise per Session:   Stress:   . Feeling of Stress :   Social Connections:   . Frequency of Communication with Friends and Family:   . Frequency of Social Gatherings with Friends and Family:   . Attends Religious Services:   . Active Member of Clubs or Organizations:   . Attends Archivist Meetings:   Marland Kitchen Marital Status:      Family History:  The patient's family history includes Breast cancer in her cousin; Breast cancer (age of onset: 7) in her sister; Diabetes in her cousin and sister; Hypertension in her mother and sister.   Review of Systems:   Please see the history of present illness.     General:  No chills, fever, night sweats or weight changes.  Cardiovascular:  No chest pain, dyspnea on exertion, edema, orthopnea, palpitations, paroxysmal nocturnal dyspnea. Dermatological: No rash, lesions/masses Respiratory:  No cough, dyspnea Urologic: No hematuria, dysuria Abdominal:   No nausea, vomiting, diarrhea, bright red blood per rectum, melena, or hematemesis Neurologic:  No visual changes, wkns, changes in mental status.  She denies any of the above.   All other systems reviewed and are otherwise negative except as noted above.   Physical Exam:    VS:  BP 132/74   Pulse 76   Temp 99.1 F (37.3 C)   Ht 5\' 6"  (1.676 m)   Wt 166 lb 12.8 oz (75.7 kg)   SpO2 98%  BMI 26.92 kg/m    General: Well developed, well nourished,female appearing in no acute distress. Head: Normocephalic, atraumatic, sclera non-icteric.  Neck: No carotid bruits. JVD not elevated.  Lungs: Respirations regular and unlabored, without wheezes or rales.  Heart: Regular rate and rhythm. No S3 or S4.  No murmur, no rubs, or gallops appreciated. Abdomen: Soft, non-tender, non-distended. No obvious abdominal masses. Msk:  Strength and tone appear normal for age. No obvious joint deformities or effusions. Extremities: No clubbing or cyanosis. No lower extremity edema.  Distal pedal pulses are 2+ bilaterally. Neuro: Alert and oriented X 3. Moves all extremities spontaneously. No focal deficits noted. Psych:  Responds to questions appropriately with a normal affect. Skin: No rashes or lesions noted  Wt Readings from Last 3 Encounters:  10/29/19 166 lb 12.8 oz (75.7 kg)  10/15/19 165 lb (74.8 kg)  08/31/19 167 lb (75.8 kg)     Studies/Labs Reviewed:   EKG:  EKG is not ordered today.    Recent Labs: No results found for requested labs within last 8760 hours.   Lipid Panel No results found for: CHOL, TRIG, HDL, CHOLHDL, VLDL, LDLCALC, LDLDIRECT  Additional studies/ records that were reviewed today include:   NST: 01/2014 IMPRESSION: 1.  Normal Lexiscan Cardiolite stress test.  2.  No evidence of myocardial ischemia or scar.  3.  Exaggerated hypertensive response noted.  4. Normal left ventricular systolic  function and regional wall motion, calculated LVEF 74%.  5.  No chest pain was reported.   Echocardiogram: 10/2018 IMPRESSIONS    1. The left ventricle has normal systolic function with an ejection  fraction of 60-65%. The cavity size was normal. There is mildly increased  left ventricular wall thickness. Left ventricular diastolic Doppler  parameters are consistent with impaired  relaxation.  2. The right ventricle has normal systolic function. The cavity was  normal. There is no increase in right ventricular wall thickness.  3. The mitral valve is normal in structure. No evidence of mitral valve  stenosis.  4. The tricuspid valve is normal in structure.  5. The aortic valve is tricuspid.  6. The aortic root is normal in size and structure.   Assessment:    1. Essential hypertension   2. Hypertensive heart disease without heart failure   3. Type 2 diabetes mellitus with complication, with long-term current use of insulin ([place])      Plan:   In order of problems listed above:  1. HTN - BP has significantly improved when checked at home per her report.  Was initially elevated at 140/76 upon walking into the office but improved to 132/74 on recheck. - She remains on Chlorthalidone 25mg  daily, Clonidine 0.2mg  TID, Hydralazine 50mg  TID, Losartan 100mg  daily and Labetalol 400mg  BID. Given her episodes of weakness when SBP is in the 110's, would not further titrate medications at this time. I suspect this is secondary to the change in her BP over the past few months. If symptoms do not improve, may need to reduce Labetalol to 300mg  BID.   2. Hypertensive Heart Disease - Echocardiogram in 10/2018 showed a preserved EF with mildly increased LVH.  She denies any orthopnea, PND or lower extremity edema and appears euvolemic on examination.  Continue with good BP control as outlined above.  3. IDDM - followed by her PCP. Glucose has been well-controlled when checked at home and  she continues to work on dietary changes.   Medication Adjustments/Labs and Tests Ordered: Current medicines are reviewed  at length with the patient today.  Concerns regarding medicines are outlined above.  Medication changes, Labs and Tests ordered today are listed in the Patient Instructions below. Patient Instructions  Medication Instructions:  Your physician recommends that you continue on your current medications as directed. Please refer to the Current Medication list given to you today.  *If you need a refill on your cardiac medications before your next appointment, please call your pharmacy*   Lab Work: None  If you have labs (blood work) drawn today and your tests are completely normal, you will receive your results only by: Marland Kitchen MyChart Message (if you have MyChart) OR . A paper copy in the mail If you have any lab test that is abnormal or we need to change your treatment, we will call you to review the results.   Testing/Procedures: NONE    Follow-Up: At [facility], you and your health needs are our priority.  As part of our continuing mission to provide you with exceptional heart care, we have created designated Provider Care Teams.  These Care Teams include your primary Cardiologist (physician) and Advanced Practice Providers (APPs -  Physician Assistants and Nurse Practitioners) who all work together to provide you with the care you need, when you need it.  We recommend signing up for the patient portal called "MyChart".  Sign up information is provided on this After Visit Summary.  MyChart is used to connect with patients for Virtual Visits (Telemedicine).  Patients are able to view lab/test results, encounter notes, upcoming appointments, etc.  Non-urgent messages can be sent to your provider as well.   To learn more about what you can do with MyChart, go to [email or web address].    Your next appointment:   6 month(s)  The format for your next appointment:    Either In Person or Virtual  Provider:   Rozann Lesches, MD   Other Instructions Thank you for choosing [facility]!       Signed, Erma Heritage, PA-C  10/29/2019 4:59 PM    [facility] [address] [place], [place] [address] Phone: [phone] Fax: [phone]

## 2019-10-29 NOTE — Patient Instructions (Signed)
Medication Instructions:  Your physician recommends that you continue on your current medications as directed. Please refer to the Current Medication list given to you today.  *If you need a refill on your cardiac medications before your next appointment, please call your pharmacy*   Lab Work: None  If you have labs (blood work) drawn today and your tests are completely normal, you will receive your results only by: Marland Kitchen MyChart Message (if you have MyChart) OR . A paper copy in the mail If you have any lab test that is abnormal or we need to change your treatment, we will call you to review the results.   Testing/Procedures: NONE    Follow-Up: [facility] are our [facility].  As part of our continuing mission to provide you with exceptional heart care, we have created designated Provider Care Teams.  These Care Teams include your primary Cardiologist (physician) and Advanced Practice Providers (APPs -  Physician Assistants and Nurse Practitioners) who all work together to provide you with the care you need, when you need it.  We recommend signing up for the patient portal called "MyChart".  Sign up information is provided on this After Visit Summary.  MyChart is used to connect with patients for Virtual Visits (Telemedicine).  Patients are able to view lab/test results, encounter notes, upcoming appointments, etc.  Non-urgent messages can be sent to your provider as well.   To learn more about what you can do with MyChart, go to [email or web address].    Your next appointment:   6 month(s)  The format for your next appointment:   Either In Person or Virtual  Provider:   Rozann Lesches, MD   Other Instructions Thank you for choosing [facility]!

## 2019-12-03 DIAGNOSIS — E113313 Type 2 diabetes mellitus with moderate nonproliferative diabetic retinopathy with macular edema, bilateral: Secondary | ICD-10-CM | POA: Diagnosis not present

## 2019-12-04 DIAGNOSIS — R1312 Dysphagia, oropharyngeal phase: Secondary | ICD-10-CM | POA: Diagnosis not present

## 2019-12-04 DIAGNOSIS — J342 Deviated nasal septum: Secondary | ICD-10-CM | POA: Diagnosis not present

## 2019-12-04 DIAGNOSIS — J310 Chronic rhinitis: Secondary | ICD-10-CM | POA: Diagnosis not present

## 2019-12-04 DIAGNOSIS — J343 Hypertrophy of nasal turbinates: Secondary | ICD-10-CM | POA: Diagnosis not present

## 2019-12-07 DIAGNOSIS — G4733 Obstructive sleep apnea (adult) (pediatric): Secondary | ICD-10-CM | POA: Diagnosis not present

## 2019-12-16 ENCOUNTER — Other Ambulatory Visit: Payer: Self-pay

## 2019-12-16 ENCOUNTER — Ambulatory Visit: Payer: Medicare HMO | Attending: Internal Medicine

## 2019-12-16 DIAGNOSIS — Z20822 Contact with and (suspected) exposure to covid-19: Secondary | ICD-10-CM

## 2019-12-17 LAB — SARS-COV-2, NAA 2 DAY TAT

## 2019-12-17 LAB — NOVEL CORONAVIRUS, NAA: SARS-CoV-2, NAA: NOT DETECTED

## 2019-12-18 DIAGNOSIS — I10 Essential (primary) hypertension: Secondary | ICD-10-CM | POA: Diagnosis not present

## 2019-12-18 DIAGNOSIS — E1165 Type 2 diabetes mellitus with hyperglycemia: Secondary | ICD-10-CM | POA: Diagnosis not present

## 2019-12-21 DIAGNOSIS — Z0000 Encounter for general adult medical examination without abnormal findings: Secondary | ICD-10-CM | POA: Diagnosis not present

## 2019-12-21 DIAGNOSIS — I10 Essential (primary) hypertension: Secondary | ICD-10-CM | POA: Diagnosis not present

## 2019-12-21 DIAGNOSIS — Z6826 Body mass index (BMI) 26.0-26.9, adult: Secondary | ICD-10-CM | POA: Diagnosis not present

## 2019-12-21 DIAGNOSIS — Z1331 Encounter for screening for depression: Secondary | ICD-10-CM | POA: Diagnosis not present

## 2019-12-21 DIAGNOSIS — J309 Allergic rhinitis, unspecified: Secondary | ICD-10-CM | POA: Diagnosis not present

## 2019-12-21 DIAGNOSIS — E1143 Type 2 diabetes mellitus with diabetic autonomic (poly)neuropathy: Secondary | ICD-10-CM | POA: Diagnosis not present

## 2020-01-04 IMAGING — MG DIGITAL SCREENING BILAT W/ TOMO W/ CAD
8 series · 8 of 24 positions shown · non-contrast
Comparison: Previous exam(s).

CLINICAL DATA: Screening.

EXAM:
DIGITAL SCREENING BILATERAL MAMMOGRAM WITH TOMO AND CAD

[R CC synth-2D]
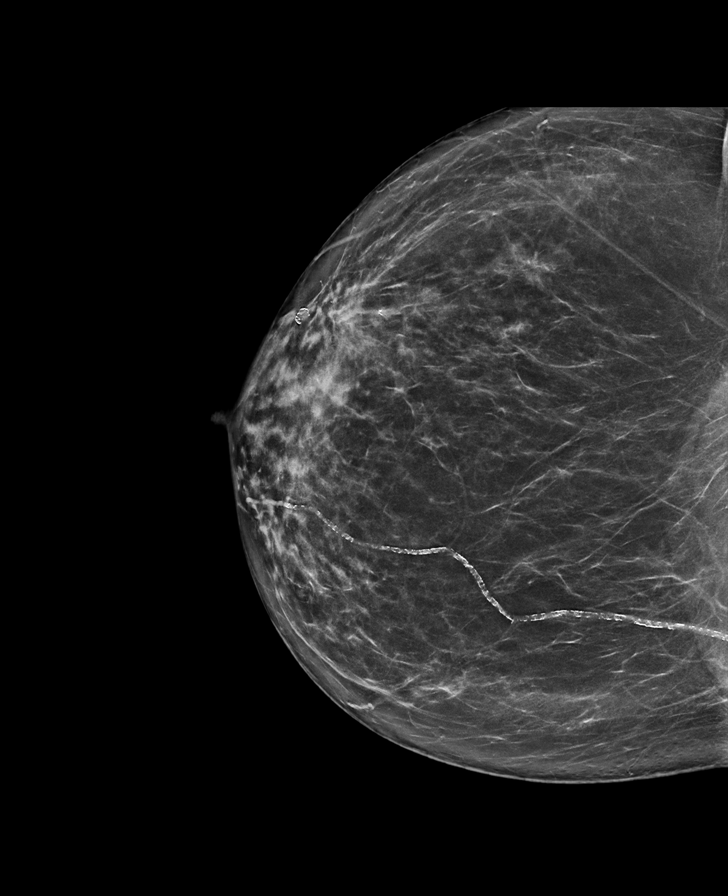

[L CC synth-2D]
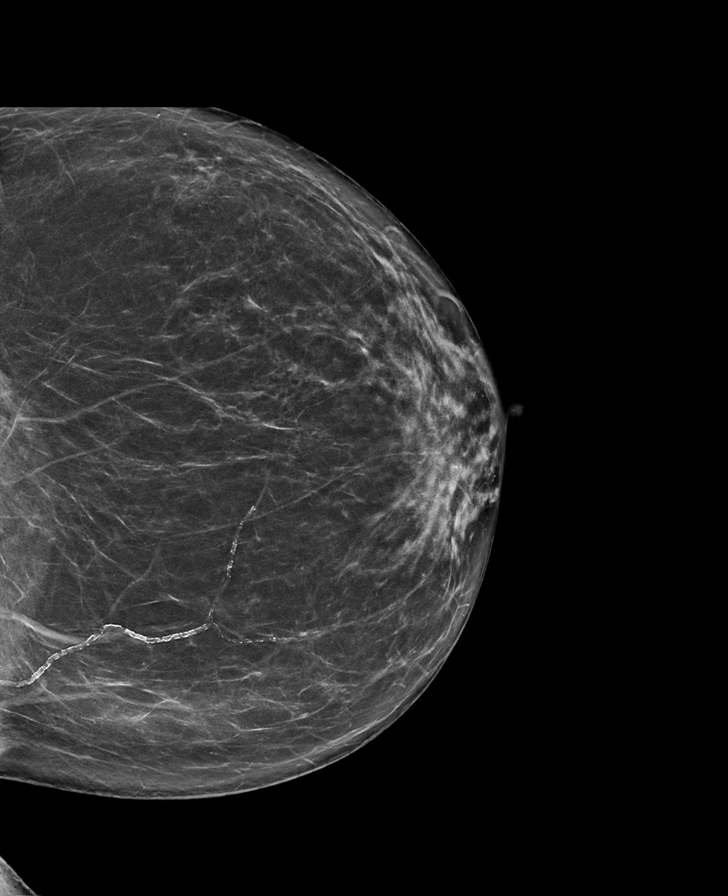

[L MLO synth-2D]
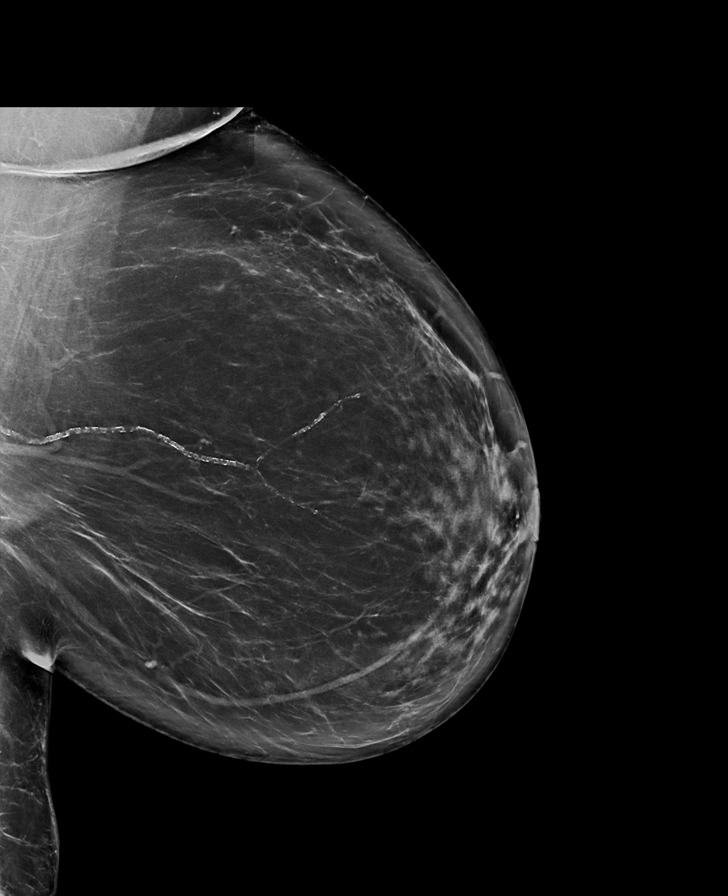

[R MLO synth-2D]
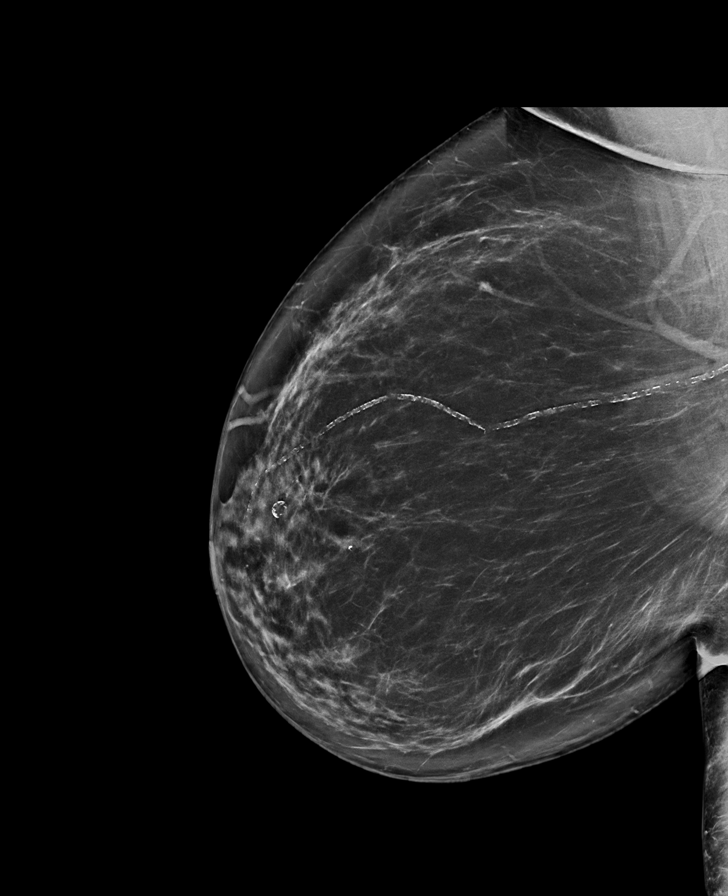

[L MLO tomo · tomo slice 48/95.0]
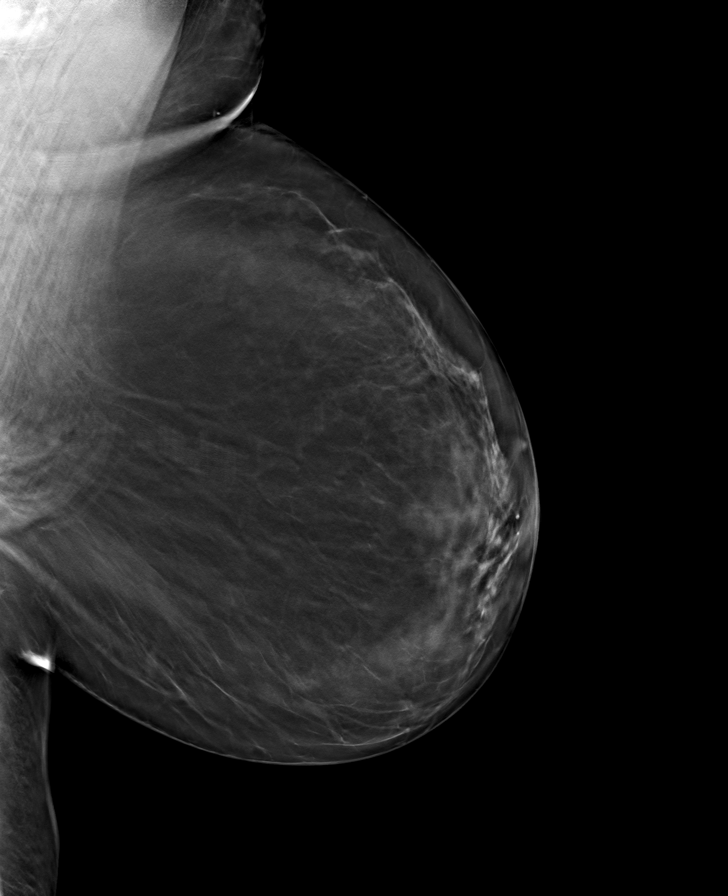

[L CC tomo · tomo slice 37/73.0]
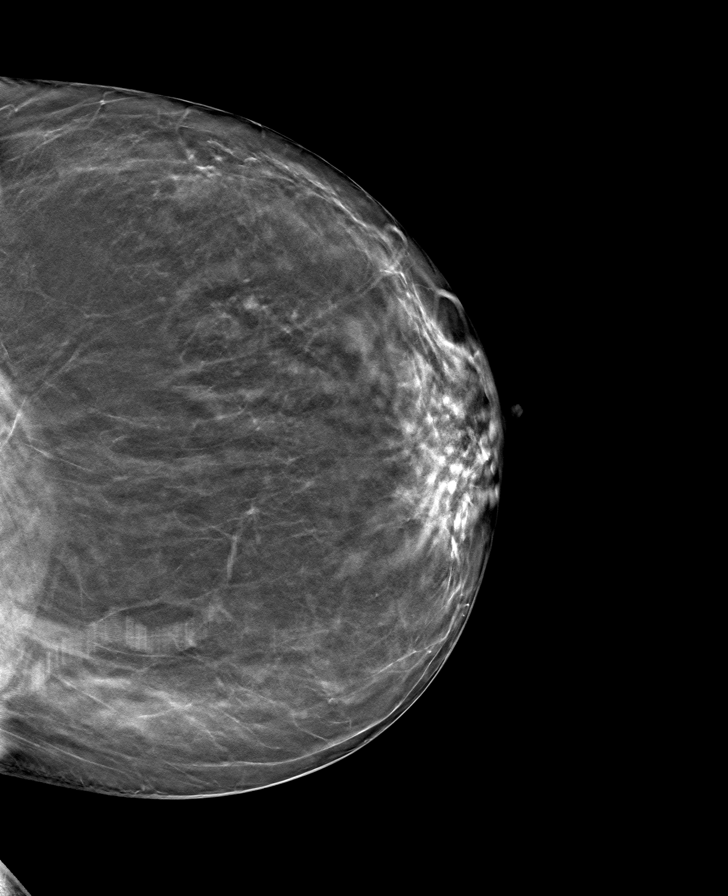

[R MLO tomo · tomo slice 47/92.0]
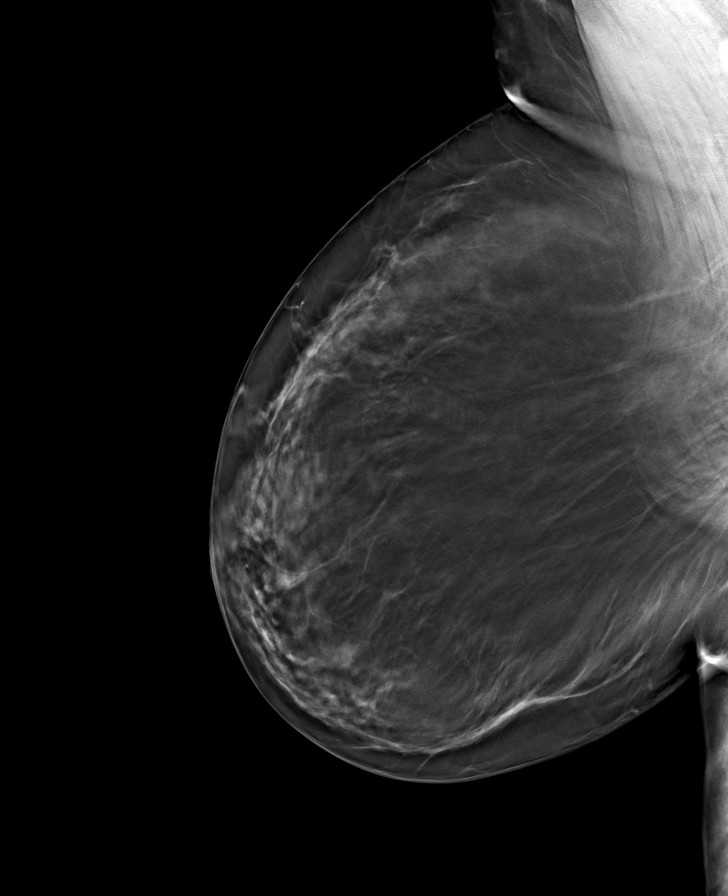

[R CC tomo · tomo slice 39/78.0]
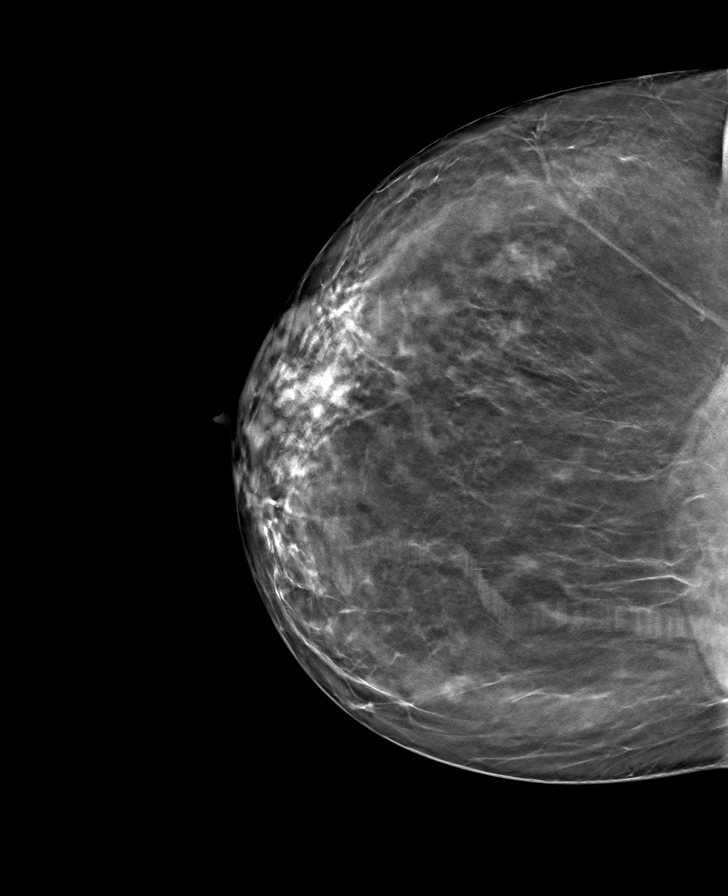

[8 of 24 positions shown; findings below may reference images not displayed]

ACR Breast Density Category b: There are scattered areas of
fibroglandular density.
FINDINGS: There are no findings suspicious for malignancy. Images were
processed with CAD.
IMPRESSION: No mammographic evidence of malignancy. A result letter of this
screening mammogram will be mailed directly to the patient.

RECOMMENDATION:
Screening mammogram in one year. (Code:[id number])

BI-RADS CATEGORY  1: Negative.

## 2020-01-06 DIAGNOSIS — E113313 Type 2 diabetes mellitus with moderate nonproliferative diabetic retinopathy with macular edema, bilateral: Secondary | ICD-10-CM | POA: Diagnosis not present

## 2020-01-07 DIAGNOSIS — I10 Essential (primary) hypertension: Secondary | ICD-10-CM | POA: Diagnosis not present

## 2020-01-07 DIAGNOSIS — E1143 Type 2 diabetes mellitus with diabetic autonomic (poly)neuropathy: Secondary | ICD-10-CM | POA: Diagnosis not present

## 2020-01-14 DIAGNOSIS — J343 Hypertrophy of nasal turbinates: Secondary | ICD-10-CM | POA: Diagnosis not present

## 2020-01-14 DIAGNOSIS — J342 Deviated nasal septum: Secondary | ICD-10-CM | POA: Diagnosis not present

## 2020-01-14 DIAGNOSIS — J310 Chronic rhinitis: Secondary | ICD-10-CM | POA: Diagnosis not present

## 2020-01-15 ENCOUNTER — Other Ambulatory Visit: Payer: Self-pay | Admitting: Otolaryngology

## 2020-01-22 DIAGNOSIS — F411 Generalized anxiety disorder: Secondary | ICD-10-CM | POA: Diagnosis not present

## 2020-01-22 DIAGNOSIS — M79671 Pain in right foot: Secondary | ICD-10-CM | POA: Diagnosis not present

## 2020-01-22 DIAGNOSIS — I10 Essential (primary) hypertension: Secondary | ICD-10-CM | POA: Diagnosis not present

## 2020-01-22 DIAGNOSIS — Z6826 Body mass index (BMI) 26.0-26.9, adult: Secondary | ICD-10-CM | POA: Diagnosis not present

## 2020-01-22 DIAGNOSIS — J309 Allergic rhinitis, unspecified: Secondary | ICD-10-CM | POA: Diagnosis not present

## 2020-02-04 DIAGNOSIS — E119 Type 2 diabetes mellitus without complications: Secondary | ICD-10-CM | POA: Diagnosis not present

## 2020-02-04 DIAGNOSIS — F411 Generalized anxiety disorder: Secondary | ICD-10-CM | POA: Diagnosis not present

## 2020-02-04 DIAGNOSIS — I10 Essential (primary) hypertension: Secondary | ICD-10-CM | POA: Diagnosis not present

## 2020-02-11 DIAGNOSIS — E113313 Type 2 diabetes mellitus with moderate nonproliferative diabetic retinopathy with macular edema, bilateral: Secondary | ICD-10-CM | POA: Diagnosis not present

## 2020-02-12 ENCOUNTER — Encounter (HOSPITAL_COMMUNITY): Payer: Self-pay

## 2020-02-12 ENCOUNTER — Other Ambulatory Visit: Payer: Self-pay

## 2020-02-12 ENCOUNTER — Other Ambulatory Visit (HOSPITAL_COMMUNITY): Payer: Medicare HMO

## 2020-02-12 ENCOUNTER — Encounter (HOSPITAL_COMMUNITY)
Admission: RE | Admit: 2020-02-12 | Discharge: 2020-02-12 | Disposition: A | Discharge status: Home / Self Care | Payer: Medicare HMO | Source: Ambulatory Visit | Attending: Otolaryngology | Admitting: Otolaryngology

## 2020-02-12 DIAGNOSIS — Z01818 Encounter for other preprocedural examination: Secondary | ICD-10-CM | POA: Diagnosis not present

## 2020-02-12 HISTORY — DX: Gastro-esophageal reflux disease without esophagitis: K21.9

## 2020-02-12 HISTORY — DX: Anxiety disorder, unspecified: F41.9

## 2020-02-12 LAB — CBC
HCT: 39.9 % (ref 36.0–46.0)
Hemoglobin: 12.4 g/dL (ref 12.0–15.0)
MCH: 25.3 pg — ABNORMAL LOW (ref 26.0–34.0)
MCHC: 31.1 g/dL (ref 30.0–36.0)
MCV: 81.4 fL (ref 80.0–100.0)
Platelets: 349 10*3/uL (ref 150–400)
RBC: 4.9 MIL/uL (ref 3.87–5.11)
RDW: 15.4 % (ref 11.5–15.5)
WBC: 8 10*3/uL (ref 4.0–10.5)
nRBC: 0 % (ref 0.0–0.2)

## 2020-02-12 LAB — BASIC METABOLIC PANEL
Anion gap: 12 (ref 5–15)
BUN: 15 mg/dL (ref 6–20)
CO2: 32 mmol/L (ref 22–32)
Calcium: 9.7 mg/dL (ref 8.9–10.3)
Chloride: 97 mmol/L — ABNORMAL LOW (ref 98–111)
Creatinine, Ser: 0.78 mg/dL (ref 0.44–1.00)
GFR calc Af Amer: >60 mL/min (ref >60)
GFR calc non Af Amer: >60 mL/min (ref >60)
Glucose, Bld: 141 mg/dL — ABNORMAL HIGH (ref 70–99)
Potassium: 3.8 mmol/L (ref 3.5–5.1)
Sodium: 141 mmol/L (ref 135–145)

## 2020-02-12 LAB — HEMOGLOBIN A1C
Hgb A1c MFr Bld: 8 % — ABNORMAL HIGH (ref 4.8–5.6)
Mean Plasma Glucose: 182.9 mg/dL

## 2020-02-12 LAB — GLUCOSE, CAPILLARY: Glucose-Capillary: 137 mg/dL — ABNORMAL HIGH (ref 70–99)

## 2020-02-12 NOTE — Progress Notes (Addendum)
PCP - Dr. Stoney Bang Cardiologist - Dr. Rozann Lesches  PPM/ICD - denies  Chest x-ray - N/A EKG - 02/12/2020 Stress Test - 12/19/17  ECHO - 10/24/2018 Cardiac Cath - denies  Sleep Study - Yes CPAP - wears every night normally but due to deviated septum not able to   Fasting Blood Sugar - 110 - 165 Checks Blood Sugar 4 times/day  Blood Thinner Instructions: N/A Aspirin Instructions: N/A  ERAS Protcol - No  COVID TEST- Scheduled for 02/15/2020. Patient verbalized understanding of self-quarantine instructions, appointment time and place.  Anesthesia review: YES, hx of COPD  (not on oxygen) Per patient does not currently see a pulmonologist. Patient stated she does get short of breath with long distances.  Patient denies shortness of breath, fever, cough and chest pain at PAT appointment   All instructions explained to the patient, with a verbal understanding of the material. Patient agrees to go over the instructions while at home for a better understanding. Patient also instructed to self quarantine after being tested for COVID-19. The opportunity to ask questions was provided.

## 2020-02-12 NOTE — Progress Notes (Signed)
[place], [place] - [address] [place] [address] Phone: [phone] Fax: [facility] - [place], [place] [address] Phone: [phone] Fax: [phone]   Your procedure is scheduled on Wednesday, June 30th.  Report to [facility] Main Entrance "A" at 6:30 A.M., and check in at the Admitting office.  Call this number if you have problems the morning of surgery:  [phone]  Call [phone] if you have any questions prior to your surgery date Monday-Friday 8am-4pm   Remember:  Do not eat or drink after midnight the night before your surgery    Take these medicines the morning of surgery with A SIP OF WATER  cloNIDine (CATAPRES) brimonidine (ALPHAGAN)/eyes dorzolamide (TRUSOPT)/eye drops fluticasone (FLONASE)/nasal spray hydrALAZINE (APRESOLINE)  labetalol (NORMODYNE)  minoxidil (LONITEN) tiotropium (SPIRIVA)   IF NEEDED - albuterol (PROVENTIL)/nebulizer, , budesonide-formoterol (SYMBICORT), meclizine (ANTIVERT)  albuterol (PROVENTIL HFA;VENTOLIN HFA)/inhaler - bring with you the day of surgery  As of today, STOP taking any Aspirin (unless otherwise instructed by your surgeon) and Aspirin containing products, Aleve, Naproxen, Ibuprofen, Motrin, Advil, Goody's, BC's, all herbal medications, fish oil, and all vitamins.          WHAT DO I DO ABOUT MY DIABETES MEDICATION?    -The night before surgery NO bedtime dose of insulin lispro (ADMELOG SOLOSTAR)  Take 25 units of insulin glargine (LANTUS)  - The morning of surgery Take 25 units  of insulin glargine (LANTUS) Do NOT take insulin lispro (ADMELOG SOLOSTAR)   . If your CBG is greater than 220 mg/dL, you may take  of your sliding scale (correction) dose of insulin.  HOW TO MANAGE YOUR DIABETES BEFORE AND AFTER SURGERY  Why is it important to control my blood sugar before and after  surgery? . Improving blood sugar levels before and after surgery helps healing and can limit problems. . A way of improving blood sugar control is eating a healthy diet by: o  Eating less sugar and carbohydrates o  Increasing activity/exercise o  Talking with your doctor about reaching your blood sugar goals . High blood sugars (greater than 180 mg/dL) can raise your risk of infections and slow your recovery, so you will need to focus on controlling your diabetes during the weeks before surgery. . Make sure that the doctor who takes care of your diabetes knows about your planned surgery including the date and location.  How do I manage my blood sugar before surgery? . Check your blood sugar at least 4 times a day, starting 2 days before surgery, to make sure that the level is not too high or low. . Check your blood sugar the morning of your surgery when you wake up and every 2 hours until you get to the Short Stay unit. o If your blood sugar is less than 70 mg/dL, you will need to treat for low blood sugar: - Do not take insulin. - Treat a low blood sugar (less than 70 mg/dL) with  cup of clear juice (cranberry or apple), 4 glucose tablets, OR glucose gel. - Recheck blood sugar in 15 minutes after treatment (to make sure it is greater than 70 mg/dL). If your blood sugar is not greater than 70 mg/dL on recheck, call [phone] for further instructions. . Report your blood sugar to the short stay nurse when you get to Short Stay.  . If you are admitted to  the hospital after surgery: o Your blood sugar will be checked by the staff and you will probably be given insulin after surgery (instead of oral diabetes medicines) to make sure you have good blood sugar levels. o The goal for blood sugar control after surgery is 80-180 mg/dL.             Do not wear jewelry, make up, or nail polish            Do not wear lotions, powders, perfumes, or deodorant.            Do not shave 48 hours prior to  surgery.              Do not bring valuables to the hospital.            [facility] is not responsible for any belongings or valuables.  Do NOT Smoke (Tobacco/Vapping) or drink Alcohol 24 hours prior to your procedure If you use a CPAP at night, you may bring all equipment for your overnight stay.   Contacts, glasses, dentures or bridgework may not be worn into surgery.      For patients admitted to the hospital, discharge time will be determined by your treatment team.   Patients discharged the day of surgery will not be allowed to drive home, and someone needs to stay with them for 24 hours.  Special instructions:   [facility]- Preparing For Surgery  Before surgery, you can play an important role. Because skin is not sterile, your skin needs to be as free of germs as possible. You can reduce the number of germs on your skin by washing with CHG (chlorahexidine gluconate) Soap before surgery.  CHG is an antiseptic cleaner which kills germs and bonds with the skin to continue killing germs even after washing.    Oral Hygiene is also important to reduce your risk of infection.  Remember - BRUSH YOUR TEETH THE MORNING OF SURGERY WITH YOUR REGULAR TOOTHPASTE  Please do not use if you have an allergy to CHG or antibacterial soaps. If your skin becomes reddened/irritated stop using the CHG.  Do not shave (including legs and underarms) for at least 48 hours prior to first CHG shower. It is OK to shave your face.  Please follow these instructions carefully.   1. Shower the NIGHT BEFORE SURGERY and the MORNING OF SURGERY with CHG Soap.   2. If you chose to wash your hair, wash your hair first as usual with your normal shampoo.  3. After you shampoo, rinse your hair and body thoroughly to remove the shampoo.  4. Use CHG as you would any other liquid soap. You can apply CHG directly to the skin and wash gently with a scrungie or a clean washcloth.   5. Apply the CHG Soap to your body ONLY  FROM THE NECK DOWN.  Do not use on open wounds or open sores. Avoid contact with your eyes, ears, mouth and genitals (private parts). Wash Face and genitals (private parts)  with your normal soap.   6. Wash thoroughly, paying special attention to the area where your surgery will be performed.  7. Thoroughly rinse your body with warm water from the neck down.  8. DO NOT shower/wash with your normal soap after using and rinsing off the CHG Soap.  9. Pat yourself dry with a CLEAN TOWEL.  10. Wear CLEAN PAJAMAS to bed the night before surgery, wear comfortable clothes the morning of surgery  11.  Place CLEAN SHEETS on your bed the night of your first shower and DO NOT SLEEP WITH PETS.  Day of Surgery: Shower with CHG soap as instructed above.  Do not apply any deodorants/lotions.  Please wear clean clothes to the hospital/surgery center.   Remember to brush your teeth WITH YOUR REGULAR TOOTHPASTE.   Please read over the following fact sheets that you were given.

## 2020-02-15 ENCOUNTER — Other Ambulatory Visit (HOSPITAL_COMMUNITY)
Admission: RE | Admit: 2020-02-15 | Discharge: 2020-02-15 | Disposition: A | Discharge status: Home / Self Care | Payer: Medicare HMO | Source: Ambulatory Visit | Attending: Otolaryngology | Admitting: Otolaryngology

## 2020-02-15 DIAGNOSIS — Z01812 Encounter for preprocedural laboratory examination: Secondary | ICD-10-CM | POA: Insufficient documentation

## 2020-02-15 DIAGNOSIS — Z20822 Contact with and (suspected) exposure to covid-19: Secondary | ICD-10-CM | POA: Insufficient documentation

## 2020-02-15 LAB — SARS CORONAVIRUS 2 (TAT 6-24 HRS): SARS Coronavirus 2: NEGATIVE

## 2020-02-15 NOTE — Progress Notes (Signed)
Anesthesia Chart Review:  Follows with cardiology for difficult to treat HTN. Currently on  Chlorthalidone 25mg  daily, Clonidine 0.2mg  TID, Hydralazine 50mg  TID, Losartan 100mg  daily and Labetalol 400mg  BID. Last seen 10/29/19 and BP was controlled at that time. Echo March 2020 showed LVEF 60 to 65% with mild diastolic dysfunction and no major valvular abnormalities  Hx of asthma and COPD with stable DOE, not on supplemental O2.  OSA on CPAP, reportedly recent difficulty wearing due to deviated septum.  IDDMII, A1c 8.0 02/12/20.  Preop labs reviewed, unremarkable.  EKG 02/12/20: Normal sinus rhythm. Rate 68. LAD. Septal infarct , age undetermined. No significant change since last tracing  TTE 10/24/18: 1. The left ventricle has normal systolic function with an ejection  fraction of 60-65%. The cavity size was normal. There is mildly increased  left ventricular wall thickness. Left ventricular diastolic Doppler  parameters are consistent with impaired  relaxation.  2. The right ventricle has normal systolic function. The cavity was  normal. There is no increase in right ventricular wall thickness.  3. The mitral valve is normal in structure. No evidence of mitral valve  stenosis.  4. The tricuspid valve is normal in structure.  5. The aortic valve is tricuspid.  6. The aortic root is normal in size and structure.   Wynonia Musty [facility] Short Stay Center/Anesthesiology Phone [phone] 02/15/2020 3:32 PM

## 2020-02-15 NOTE — Anesthesia Preprocedure Evaluation (Addendum)
Anesthesia Evaluation  Patient identified by MRN, date of birth, ID band Patient awake    Reviewed: Allergy & Precautions, NPO status , Patient's Chart, lab work & pertinent test results  History of Anesthesia Complications Negative for: history of anesthetic complications  Airway Mallampati: I  TM Distance: >3 FB Neck ROM: Full    Dental  (+) Dental Advisory Given   Pulmonary sleep apnea and Continuous Positive Airway Pressure Ventilation , COPD,  COPD inhaler, former smoker,  02/15/2020 SARS coronavirus NEG   breath sounds clear to auscultation       Cardiovascular hypertension, Pt. on medications (-) angina Rhythm:Regular Rate:Normal     Neuro/Psych Anxiety Depression negative neurological ROS     GI/Hepatic Neg liver ROS, GERD  Medicated and Controlled,  Endo/Other  diabetes (glu 224), Insulin Dependent  Renal/GU negative Renal ROS   H/o vulvar cancer    Musculoskeletal   Abdominal   Peds  Hematology negative hematology ROS (+)   Anesthesia Other Findings   Reproductive/Obstetrics                           Anesthesia Physical Anesthesia Plan  ASA: III  Anesthesia Plan: General   Post-op Pain Management:    Induction: Intravenous  PONV Risk Score and Plan: 3 and Ondansetron, Dexamethasone and Scopolamine patch - Pre-op  Airway Management Planned: Oral ETT  Additional Equipment: None  Intra-op Plan:   Post-operative Plan: Extubation in OR  Informed Consent: I have reviewed the patients History and Physical, chart, labs and discussed the procedure including the risks, benefits and alternatives for the proposed anesthesia with the patient or authorized representative who has indicated his/her understanding and acceptance.     Dental advisory given  Plan Discussed with: CRNA and Surgeon  Anesthesia Plan Comments: (PAT note by Karoline Caldwell, PA-C: Follows with cardiology  for difficult to treat HTN. Currently on  Chlorthalidone 25mg  daily, Clonidine 0.2mg  TID, Hydralazine 50mg  TID, Losartan 100mg  daily and Labetalol 400mg  BID. Last seen 10/29/19 and BP was controlled at that time. Echo March 2020 showed LVEF 60 to 65% with mild diastolic dysfunction and no major valvular abnormalities  Hx of asthma and COPD with stable DOE, not on supplemental O2.  OSA on CPAP, reportedly recent difficulty wearing due to deviated septum.  IDDMII, A1c 8.0 02/12/20.  Preop labs reviewed, unremarkable.  EKG 02/12/20: Normal sinus rhythm. Rate 68. LAD. Septal infarct , age undetermined. No significant change since last tracing  TTE 10/24/18: 1. The left ventricle has normal systolic function with an ejection  fraction of 60-65%. The cavity size was normal. There is mildly increased  left ventricular wall thickness. Left ventricular diastolic Doppler  parameters are consistent with impaired  relaxation.  2. The right ventricle has normal systolic function. The cavity was  normal. There is no increase in right ventricular wall thickness.  3. The mitral valve is normal in structure. No evidence of mitral valve  stenosis.  4. The tricuspid valve is normal in structure.  5. The aortic valve is tricuspid.  6. The aortic root is normal in size and structure.    )      Anesthesia Quick Evaluation

## 2020-02-17 ENCOUNTER — Ambulatory Visit (HOSPITAL_COMMUNITY): Payer: Medicare HMO | Admitting: Vascular Surgery

## 2020-02-17 ENCOUNTER — Ambulatory Visit (HOSPITAL_COMMUNITY)
Admission: RE | Admit: 2020-02-17 | Discharge: 2020-02-18 | Disposition: A | Discharge status: Home / Self Care | Payer: Medicare HMO | Attending: Otolaryngology | Admitting: Otolaryngology

## 2020-02-17 ENCOUNTER — Ambulatory Visit (HOSPITAL_COMMUNITY): Payer: Medicare HMO | Admitting: Certified Registered"

## 2020-02-17 ENCOUNTER — Encounter (HOSPITAL_COMMUNITY)
Admission: RE | Disposition: A | Discharge status: Home / Self Care | Payer: Self-pay | Source: Home / Self Care | Attending: Otolaryngology

## 2020-02-17 ENCOUNTER — Other Ambulatory Visit: Payer: Self-pay

## 2020-02-17 ENCOUNTER — Encounter (HOSPITAL_COMMUNITY): Payer: Self-pay | Admitting: Otolaryngology

## 2020-02-17 DIAGNOSIS — E119 Type 2 diabetes mellitus without complications: Secondary | ICD-10-CM | POA: Diagnosis not present

## 2020-02-17 DIAGNOSIS — Z7951 Long term (current) use of inhaled steroids: Secondary | ICD-10-CM | POA: Diagnosis not present

## 2020-02-17 DIAGNOSIS — Z79899 Other long term (current) drug therapy: Secondary | ICD-10-CM | POA: Diagnosis not present

## 2020-02-17 DIAGNOSIS — J343 Hypertrophy of nasal turbinates: Secondary | ICD-10-CM | POA: Insufficient documentation

## 2020-02-17 DIAGNOSIS — Z8544 Personal history of malignant neoplasm of other female genital organs: Secondary | ICD-10-CM | POA: Diagnosis not present

## 2020-02-17 DIAGNOSIS — F329 Major depressive disorder, single episode, unspecified: Secondary | ICD-10-CM | POA: Insufficient documentation

## 2020-02-17 DIAGNOSIS — Z794 Long term (current) use of insulin: Secondary | ICD-10-CM | POA: Diagnosis not present

## 2020-02-17 DIAGNOSIS — Z87891 Personal history of nicotine dependence: Secondary | ICD-10-CM | POA: Diagnosis not present

## 2020-02-17 DIAGNOSIS — J3489 Other specified disorders of nose and nasal sinuses: Secondary | ICD-10-CM | POA: Diagnosis not present

## 2020-02-17 DIAGNOSIS — Z9889 Other specified postprocedural states: Secondary | ICD-10-CM

## 2020-02-17 DIAGNOSIS — Z7952 Long term (current) use of systemic steroids: Secondary | ICD-10-CM | POA: Insufficient documentation

## 2020-02-17 DIAGNOSIS — I10 Essential (primary) hypertension: Secondary | ICD-10-CM | POA: Diagnosis not present

## 2020-02-17 DIAGNOSIS — J449 Chronic obstructive pulmonary disease, unspecified: Secondary | ICD-10-CM | POA: Insufficient documentation

## 2020-02-17 DIAGNOSIS — K219 Gastro-esophageal reflux disease without esophagitis: Secondary | ICD-10-CM | POA: Diagnosis not present

## 2020-02-17 DIAGNOSIS — G4733 Obstructive sleep apnea (adult) (pediatric): Secondary | ICD-10-CM | POA: Diagnosis not present

## 2020-02-17 DIAGNOSIS — J342 Deviated nasal septum: Secondary | ICD-10-CM | POA: Insufficient documentation

## 2020-02-17 DIAGNOSIS — F419 Anxiety disorder, unspecified: Secondary | ICD-10-CM | POA: Insufficient documentation

## 2020-02-17 HISTORY — PX: TURBINATE REDUCTION: SHX6157

## 2020-02-17 HISTORY — PX: NASAL SEPTOPLASTY W/ TURBINOPLASTY: SHX2070

## 2020-02-17 LAB — GLUCOSE, CAPILLARY
Glucose-Capillary: 224 mg/dL — ABNORMAL HIGH (ref 70–99)
Glucose-Capillary: 185 mg/dL — ABNORMAL HIGH (ref 70–99)
Glucose-Capillary: 374 mg/dL — ABNORMAL HIGH (ref 70–99)

## 2020-02-17 SURGERY — SEPTOPLASTY, NOSE, WITH NASAL TURBINATE REDUCTION
Anesthesia: General | Site: Nose

## 2020-02-17 MED ORDER — MECLIZINE HCL 25 MG PO TABS
25.0000 mg | ORAL_TABLET | Freq: Three times a day (TID) | ORAL | Status: DC | PRN
  Filled 2020-02-17: qty 1

## 2020-02-17 MED ORDER — MEPERIDINE HCL 25 MG/ML IJ SOLN: 6.2500 mg | INTRAMUSCULAR | Status: DC | PRN

## 2020-02-17 MED ORDER — MIDAZOLAM HCL 2 MG/2ML IJ SOLN
INTRAMUSCULAR | Status: AC
  Filled 2020-02-17: qty 2

## 2020-02-17 MED ORDER — MORPHINE SULFATE (PF) 2 MG/ML IV SOLN
2.0000 mg | INTRAVENOUS | Status: DC | PRN
  Administered 2020-02-17: 2 mg via INTRAVENOUS
  Filled 2020-02-17: qty 1

## 2020-02-17 MED ORDER — CHLORHEXIDINE GLUCONATE 0.12 % MT SOLN
15.0000 mL | Freq: Once | OROMUCOSAL | Status: AC
  Administered 2020-02-17: 15 mL via OROMUCOSAL
  Filled 2020-02-17: qty 15

## 2020-02-17 MED ORDER — OXYCODONE HCL 5 MG/5ML PO SOLN: 5.0000 mg | Freq: Once | ORAL | Status: DC | PRN

## 2020-02-17 MED ORDER — ALBUTEROL SULFATE HFA 108 (90 BASE) MCG/ACT IN AERS: 2.0000 | INHALATION_SPRAY | Freq: Four times a day (QID) | RESPIRATORY_TRACT | Status: DC | PRN

## 2020-02-17 MED ORDER — HYDRALAZINE HCL 50 MG PO TABS
50.0000 mg | ORAL_TABLET | Freq: Three times a day (TID) | ORAL | Status: DC
  Administered 2020-02-17 – 2020-02-18 (×3): 50 mg via ORAL
  Filled 2020-02-17 (×3): qty 1

## 2020-02-17 MED ORDER — BACITRACIN ZINC 500 UNIT/GM EX OINT
TOPICAL_OINTMENT | CUTANEOUS | Status: DC | PRN
  Administered 2020-02-17: 1 via TOPICAL

## 2020-02-17 MED ORDER — BACITRACIN ZINC 500 UNIT/GM EX OINT
TOPICAL_OINTMENT | CUTANEOUS | Status: AC
  Filled 2020-02-17: qty 28.35

## 2020-02-17 MED ORDER — LABETALOL HCL 100 MG PO TABS
400.0000 mg | ORAL_TABLET | Freq: Two times a day (BID) | ORAL | Status: DC
  Administered 2020-02-17 – 2020-02-18 (×2): 400 mg via ORAL
  Filled 2020-02-17 (×2): qty 4

## 2020-02-17 MED ORDER — UMECLIDINIUM BROMIDE 62.5 MCG/INH IN AEPB
1.0000 | INHALATION_SPRAY | Freq: Every day | RESPIRATORY_TRACT | Status: DC
  Filled 2020-02-17 (×2): qty 7

## 2020-02-17 MED ORDER — HYDROMORPHONE HCL 1 MG/ML IJ SOLN
0.2500 mg | INTRAMUSCULAR | Status: DC | PRN
  Administered 2020-02-17: 0.5 mg via INTRAVENOUS
  Administered 2020-02-17: 0.25 mg via INTRAVENOUS

## 2020-02-17 MED ORDER — 0.9 % SODIUM CHLORIDE (POUR BTL) OPTIME
TOPICAL | Status: DC | PRN
  Administered 2020-02-17: 1000 mL

## 2020-02-17 MED ORDER — OXYMETAZOLINE HCL 0.05 % NA SOLN
NASAL | Status: AC
  Filled 2020-02-17: qty 30

## 2020-02-17 MED ORDER — DEXAMETHASONE SODIUM PHOSPHATE 10 MG/ML IJ SOLN
INTRAMUSCULAR | Status: DC | PRN
  Administered 2020-02-17: 10 mg via INTRAVENOUS

## 2020-02-17 MED ORDER — ALBUTEROL SULFATE (2.5 MG/3ML) 0.083% IN NEBU: 2.5000 mg | INHALATION_SOLUTION | Freq: Three times a day (TID) | RESPIRATORY_TRACT | Status: DC | PRN

## 2020-02-17 MED ORDER — LACTATED RINGERS IV SOLN: INTRAVENOUS | Status: DC

## 2020-02-17 MED ORDER — CLONIDINE HCL 0.2 MG PO TABS
0.2000 mg | ORAL_TABLET | Freq: Three times a day (TID) | ORAL | Status: DC
  Administered 2020-02-17 – 2020-02-18 (×3): 0.2 mg via ORAL
  Filled 2020-02-17 (×3): qty 1

## 2020-02-17 MED ORDER — HYDROMORPHONE HCL 1 MG/ML IJ SOLN
INTRAMUSCULAR | Status: AC
  Filled 2020-02-17: qty 1

## 2020-02-17 MED ORDER — DORZOLAMIDE HCL 2 % OP SOLN
1.0000 [drp] | Freq: Two times a day (BID) | OPHTHALMIC | Status: DC
  Administered 2020-02-17 – 2020-02-18 (×2): 1 [drp] via OPHTHALMIC
  Filled 2020-02-17: qty 10

## 2020-02-17 MED ORDER — SCOPOLAMINE 1 MG/3DAYS TD PT72
1.0000 | MEDICATED_PATCH | TRANSDERMAL | Status: DC
  Administered 2020-02-17: 1.5 mg via TRANSDERMAL
  Filled 2020-02-17: qty 1

## 2020-02-17 MED ORDER — OXYCODONE HCL 5 MG PO TABS: 5.0000 mg | ORAL_TABLET | Freq: Once | ORAL | Status: DC | PRN

## 2020-02-17 MED ORDER — FENTANYL CITRATE (PF) 250 MCG/5ML IJ SOLN
INTRAMUSCULAR | Status: AC
  Filled 2020-02-17: qty 5

## 2020-02-17 MED ORDER — MIDAZOLAM HCL 5 MG/5ML IJ SOLN
INTRAMUSCULAR | Status: DC | PRN
  Administered 2020-02-17: 2 mg via INTRAVENOUS

## 2020-02-17 MED ORDER — ROCURONIUM BROMIDE 10 MG/ML (PF) SYRINGE
PREFILLED_SYRINGE | INTRAVENOUS | Status: DC | PRN
  Administered 2020-02-17: 50 mg via INTRAVENOUS

## 2020-02-17 MED ORDER — SUGAMMADEX SODIUM 200 MG/2ML IV SOLN
INTRAVENOUS | Status: DC | PRN
  Administered 2020-02-17: 150 mg via INTRAVENOUS

## 2020-02-17 MED ORDER — LIDOCAINE-EPINEPHRINE 1 %-1:100000 IJ SOLN
INTRAMUSCULAR | Status: AC
  Filled 2020-02-17: qty 1

## 2020-02-17 MED ORDER — ORAL CARE MOUTH RINSE: 15.0000 mL | Freq: Once | OROMUCOSAL | Status: AC

## 2020-02-17 MED ORDER — MIDAZOLAM HCL 2 MG/2ML IJ SOLN: 0.5000 mg | Freq: Once | INTRAMUSCULAR | Status: DC | PRN

## 2020-02-17 MED ORDER — FAMOTIDINE 20 MG PO TABS
20.0000 mg | ORAL_TABLET | Freq: Every day | ORAL | Status: DC
  Administered 2020-02-17: 20 mg via ORAL
  Filled 2020-02-17: qty 1

## 2020-02-17 MED ORDER — INSULIN ASPART 100 UNIT/ML ~~LOC~~ SOLN: 15.0000 [IU] | Freq: Three times a day (TID) | SUBCUTANEOUS | Status: DC

## 2020-02-17 MED ORDER — INSULIN GLARGINE 100 UNIT/ML ~~LOC~~ SOLN
50.0000 [IU] | Freq: Two times a day (BID) | SUBCUTANEOUS | Status: DC
  Administered 2020-02-17 – 2020-02-18 (×2): 50 [IU] via SUBCUTANEOUS
  Filled 2020-02-17 (×3): qty 0.5

## 2020-02-17 MED ORDER — ACETAMINOPHEN 500 MG PO TABS
1000.0000 mg | ORAL_TABLET | Freq: Once | ORAL | Status: AC
  Administered 2020-02-17: 1000 mg via ORAL
  Filled 2020-02-17: qty 2

## 2020-02-17 MED ORDER — LOSARTAN POTASSIUM 50 MG PO TABS
100.0000 mg | ORAL_TABLET | Freq: Every day | ORAL | Status: DC
  Administered 2020-02-17 – 2020-02-18 (×2): 100 mg via ORAL
  Filled 2020-02-17 (×2): qty 2

## 2020-02-17 MED ORDER — ONDANSETRON HCL 4 MG/2ML IJ SOLN
INTRAMUSCULAR | Status: DC | PRN
  Administered 2020-02-17: 4 mg via INTRAVENOUS

## 2020-02-17 MED ORDER — LIDOCAINE-EPINEPHRINE 1 %-1:100000 IJ SOLN
INTRAMUSCULAR | Status: DC | PRN
  Administered 2020-02-17: 3 mL

## 2020-02-17 MED ORDER — MINOXIDIL 2.5 MG PO TABS
2.5000 mg | ORAL_TABLET | Freq: Every day | ORAL | Status: DC
  Administered 2020-02-18: 2.5 mg via ORAL
  Filled 2020-02-17: qty 1

## 2020-02-17 MED ORDER — CLINDAMYCIN PHOSPHATE 900 MG/50ML IV SOLN
INTRAVENOUS | Status: DC | PRN
Start: 2020-02-17 — End: 2020-02-17
  Administered 2020-02-17: 900 mg via INTRAVENOUS

## 2020-02-17 MED ORDER — MOMETASONE FURO-FORMOTEROL FUM 200-5 MCG/ACT IN AERO
2.0000 | INHALATION_SPRAY | Freq: Two times a day (BID) | RESPIRATORY_TRACT | Status: DC
  Administered 2020-02-17 – 2020-02-18 (×2): 2 via RESPIRATORY_TRACT
  Filled 2020-02-17: qty 8.8

## 2020-02-17 MED ORDER — ONDANSETRON HCL 4 MG/2ML IJ SOLN: 4.0000 mg | INTRAMUSCULAR | Status: DC | PRN

## 2020-02-17 MED ORDER — PROMETHAZINE HCL 25 MG/ML IJ SOLN: 6.2500 mg | INTRAMUSCULAR | Status: DC | PRN

## 2020-02-17 MED ORDER — LIDOCAINE 2% (20 MG/ML) 5 ML SYRINGE
INTRAMUSCULAR | Status: DC | PRN
  Administered 2020-02-17: 40 mg via INTRAVENOUS

## 2020-02-17 MED ORDER — CLINDAMYCIN HCL 300 MG PO CAPS
300.0000 mg | ORAL_CAPSULE | Freq: Three times a day (TID) | ORAL | 0 refills | Status: AC
Start: 2020-02-17 — End: 2020-02-20

## 2020-02-17 MED ORDER — CHLORTHALIDONE 25 MG PO TABS
25.0000 mg | ORAL_TABLET | Freq: Every day | ORAL | Status: DC
  Administered 2020-02-18: 25 mg via ORAL
  Filled 2020-02-17 (×2): qty 1

## 2020-02-17 MED ORDER — BRIMONIDINE TARTRATE 0.2 % OP SOLN
1.0000 [drp] | Freq: Two times a day (BID) | OPHTHALMIC | Status: DC
  Administered 2020-02-17 – 2020-02-18 (×2): 1 [drp] via OPHTHALMIC
  Filled 2020-02-17: qty 5

## 2020-02-17 MED ORDER — OXYCODONE-ACETAMINOPHEN 5-325 MG PO TABS: 1.0000 | ORAL_TABLET | ORAL | 0 refills | Status: AC | PRN

## 2020-02-17 MED ORDER — OXYMETAZOLINE HCL 0.05 % NA SOLN
NASAL | Status: DC | PRN
  Administered 2020-02-17: 1

## 2020-02-17 MED ORDER — PROPOFOL 10 MG/ML IV BOLUS
INTRAVENOUS | Status: AC
  Filled 2020-02-17: qty 40

## 2020-02-17 MED ORDER — OXYCODONE-ACETAMINOPHEN 5-325 MG PO TABS
1.0000 | ORAL_TABLET | ORAL | Status: DC | PRN
  Administered 2020-02-17 – 2020-02-18 (×3): 2 via ORAL
  Filled 2020-02-17 (×3): qty 2

## 2020-02-17 MED ORDER — INSULIN LISPRO (1 UNIT DIAL) 100 UNIT/ML (KWIKPEN): 15.0000 [IU] | PEN_INJECTOR | Freq: Three times a day (TID) | SUBCUTANEOUS | Status: DC

## 2020-02-17 MED ORDER — PROPOFOL 10 MG/ML IV BOLUS
INTRAVENOUS | Status: DC | PRN
  Administered 2020-02-17: 120 mg via INTRAVENOUS

## 2020-02-17 MED ORDER — KCL IN DEXTROSE-NACL 20-5-0.45 MEQ/L-%-% IV SOLN
INTRAVENOUS | Status: DC
  Filled 2020-02-17: qty 1000

## 2020-02-17 MED ORDER — FENTANYL CITRATE (PF) 250 MCG/5ML IJ SOLN
INTRAMUSCULAR | Status: DC | PRN
  Administered 2020-02-17: 100 ug via INTRAVENOUS
  Administered 2020-02-17: 50 ug via INTRAVENOUS

## 2020-02-17 MED ORDER — ONDANSETRON HCL 4 MG PO TABS: 4.0000 mg | ORAL_TABLET | ORAL | Status: DC | PRN

## 2020-02-17 SURGICAL SUPPLY — 29 items
CANISTER SUCT 3000ML PPV (MISCELLANEOUS) ×4 IMPLANT
COAGULATOR SUCT 6 FR SWTCH (ELECTROSURGICAL) ×1
COAGULATOR SUCT SWTCH 10FR 6 (ELECTROSURGICAL) ×3 IMPLANT
COVER SURGICAL LIGHT HANDLE (MISCELLANEOUS) IMPLANT
COVER WAND RF STERILE (DRAPES) ×4 IMPLANT
DRAPE HALF SHEET 40X57 (DRAPES) IMPLANT
ELECT REM PT RETURN 9FT ADLT (ELECTROSURGICAL) ×4
ELECTRODE REM PT RTRN 9FT ADLT (ELECTROSURGICAL) ×2 IMPLANT
GAUZE SPONGE 2X2 8PLY STRL LF (GAUZE/BANDAGES/DRESSINGS) ×2 IMPLANT
GLOVE ECLIPSE 7.5 STRL STRAW (GLOVE) ×4 IMPLANT
GOWN STRL REUS W/ TWL LRG LVL3 (GOWN DISPOSABLE) ×4 IMPLANT
GOWN STRL REUS W/TWL LRG LVL3 (GOWN DISPOSABLE) ×8
KIT BASIN OR (CUSTOM PROCEDURE TRAY) ×4 IMPLANT
KIT TURNOVER KIT B (KITS) ×4 IMPLANT
NDL HYPO 25GX1X1/2 BEV (NEEDLE) ×2 IMPLANT
NEEDLE HYPO 25GX1X1/2 BEV (NEEDLE) ×4 IMPLANT
NS IRRIG 1000ML POUR BTL (IV SOLUTION) ×4 IMPLANT
PAD ARMBOARD 7.5X6 YLW CONV (MISCELLANEOUS) ×8 IMPLANT
SPLINT NASAL DOYLE BI-VL (GAUZE/BANDAGES/DRESSINGS) ×4 IMPLANT
SPONGE GAUZE 2X2 STER 10/PKG (GAUZE/BANDAGES/DRESSINGS) ×2
SPONGE NEURO XRAY DETECT 1X3 (DISPOSABLE) ×4 IMPLANT
SUT CHROMIC 3 0 SH 27 (SUTURE) ×4 IMPLANT
SUT CHROMIC 4 0 SH 27 (SUTURE) ×4 IMPLANT
SUT PLAIN 4 0 ~~LOC~~ 1 (SUTURE) ×4 IMPLANT
SUT PROLENE 2 0 FS (SUTURE) ×4 IMPLANT
SUT PROLENE 3 0 PS 2 (SUTURE) ×4 IMPLANT
TRAY ENT MC OR (CUSTOM PROCEDURE TRAY) ×4 IMPLANT
TUBE SALEM SUMP 16 FR W/ARV (TUBING) IMPLANT
TUBING EXTENTION W/L.L. (IV SETS) IMPLANT

## 2020-02-17 NOTE — Anesthesia Procedure Notes (Signed)
Procedure Name: Intubation Date/Time: 02/17/2020 8:22 AM Performed by: Griffin Dakin, CRNA Pre-anesthesia Checklist: Patient identified, Emergency Drugs available, Suction available and Patient being monitored Patient Re-evaluated:Patient Re-evaluated prior to induction Oxygen Delivery Method: Circle system utilized Preoxygenation: Pre-oxygenation with 100% oxygen Induction Type: IV induction Ventilation: Mask ventilation without difficulty Laryngoscope Size: Mac and 3 Grade View: Grade I Tube type: Oral Tube size: 7.0 mm Number of attempts: 1 Airway Equipment and Method: Stylet and Oral airway Placement Confirmation: ETT inserted through vocal cords under direct vision,  positive ETCO2 and breath sounds checked- equal and bilateral Secured at: 22 cm Tube secured with: Tape Dental Injury: Teeth and Oropharynx as per pre-operative assessment

## 2020-02-17 NOTE — Plan of Care (Signed)

## 2020-02-17 NOTE — Transfer of Care (Signed)
Immediate Anesthesia Transfer of Care Note  Patient: Amy Cobb  Procedure(s) Performed: BILATERAL NASAL SEPTOPLASTY (N/A Nose) BILATERAL TURBINATE REDUCTION (Bilateral Nose)  Patient Location: PACU  Anesthesia Type:General  Level of Consciousness: awake, alert  and oriented  Airway & Oxygen Therapy: Patient Spontanous Breathing  Post-op Assessment: Report given to RN and Post -op Vital signs reviewed and stable  Post vital signs: Reviewed and stable  Last Vitals:  Vitals Value Taken Time  BP 167/72 02/17/20 0926  Temp 36.4 C 02/17/20 0926  Pulse 73 02/17/20 0929  Resp 19 02/17/20 0929  SpO2 96 % 02/17/20 0929  Vitals shown include unvalidated device data.  Last Pain:  Vitals:   02/17/20 0926  PainSc: Asleep         Complications: No complications documented.

## 2020-02-17 NOTE — Anesthesia Postprocedure Evaluation (Signed)
Anesthesia Post Note  Patient: KRISTIEN SALATINO  Procedure(s) Performed: BILATERAL NASAL SEPTOPLASTY (N/A Nose) BILATERAL TURBINATE REDUCTION (Bilateral Nose)     Patient location during evaluation: PACU Anesthesia Type: General Level of consciousness: awake and alert, oriented and patient cooperative Pain management: pain level controlled Vital Signs Assessment: post-procedure vital signs reviewed and stable Respiratory status: spontaneous breathing, nonlabored ventilation, respiratory function stable and patient connected to nasal cannula oxygen Cardiovascular status: blood pressure returned to baseline and stable Postop Assessment: no apparent nausea or vomiting Anesthetic complications: no   No complications documented.  Last Vitals:  Vitals:   02/17/20 1300 02/17/20 1331  BP: (!) 152/76 (!) 167/77  Pulse: 67 71  Resp: 17 16  Temp:  36.6 C  SpO2: 96% 99%    Last Pain:  Vitals:   02/17/20 1331  TempSrc: Oral  PainSc:                  Sheree Lalla,E. Shamiracle Gorden

## 2020-02-17 NOTE — H&P (Signed)
Cc: Chronic nasal obstruction  HPI: The patient is a 59 year old female who returns today complaining of persistent chronic nasal obstruction.  The patient was previously seen 1 month ago.  At that time, she was noted to have chronic rhinitis, nasal mucosal congestion, nasal septal deviation, and bilateral inferior turbinate hypertrophy.  The patient has been using Flonase nasal spray for years.  She was also treated with systemic Prednisone on multiple occasions. According to the patient, her nasal obstruction often exacerbates after her monthly eye injection.  Currently she denies any fever or facial pain.    Exam: A flexible scope was inserted into the right nasal cavity.  Endoscopy of the interior nasal cavity, superior, inferior, and middle meatus was performed. The sphenoid-ethmoid recess was examined. Edematous mucosa was noted.  No polyp, mass, or lesion was appreciated. Nasal septal deviation noted.  Olfactory cleft was clear.  Nasopharynx was clear.  Turbinates were hypertrophied but without mass.  Incomplete response to decongestion.  The procedure was repeated on the contralateral side with similar findings.  The patient tolerated the procedure well.  Instructions were given to avoid eating or drinking for 2 hours.  Assessment: 1.  Persistent chronic rhinitis, nasal mucosal congestion, nasal septal deviation and bilateral inferior turbinate hypertrophy.  2.  The patient has not responded to daily Flonase nasal spray, systemic steroid, and allergy treatment regimen.    Plan: 1.  The nasal endoscopy findings are reviewed with the patient.  2.  The treatment options are discussed.  The options include continuing medical treatment versus surgical intervention with septoplasty and turbinate reduction.  The risks, benefits, alternatives and details of the procedures are reviewed with the  patient.   3.  The patient would like to proceed with the procedures.

## 2020-02-17 NOTE — Op Note (Signed)
DATE OF PROCEDURE: 02/17/2020  OPERATIVE REPORT   SURGEON: Leta Baptist, MD   PREOPERATIVE DIAGNOSES:  1. Nasal septal deviation.  2. Bilateral inferior turbinate hypertrophy.  3. Chronic nasal obstruction.  POSTOPERATIVE DIAGNOSES:  1. Nasal septal deviation.  2. Bilateral inferior turbinate hypertrophy.  3. Chronic nasal obstruction.  PROCEDURE PERFORMED:  1. Septoplasty.  2. Bilateral partial inferior turbinate resection.   ANESTHESIA: General endotracheal tube anesthesia.   COMPLICATIONS: None.   ESTIMATED BLOOD LOSS: 50 mL.   INDICATION FOR PROCEDURE: Amy Cobb is a 59 y.o. female with a history of chronic nasal obstruction. The patient was treated with antihistamine, decongestant, systemic steroid, and steroid nasal sprays. However, the patient continued to be symptomatic. On examination, the patient was noted to have bilateral severe inferior turbinate hypertrophy and significant nasal septal deviation, causing significant nasal obstruction. Based on the above findings, the decision was made for the patient to undergo the above-stated procedures. The risks, benefits, alternatives, and details of the procedures were discussed with the patient. Questions were invited and answered. Informed consent was obtained.   DESCRIPTION OF PROCEDURE: The patient was taken to the operating room and placed supine on the operating table. General endotracheal tube anesthesia was administered by the anesthesiologist. The patient was positioned, and prepped and draped in the standard fashion for nasal surgery. Pledgets soaked with Afrin were placed in both nasal cavities for decongestion. The pledgets were subsequently removed.  Examination of the nasal cavity revealed a nasal septal deviation. 1% lidocaine with 1:100,000 epinephrine was injected onto the nasal septum bilaterally. A hemitransfixion incision was made on the left side. The mucosal flap was carefully elevated on the left side. A  cartilaginous incision was made 1 cm superior to the caudal margin of the nasal septum. Mucosal flap was also elevated on the right side in the similar fashion. It should be noted that due to the septal deviation, the deviated portion of the cartilaginous and bony septum had to be removed in piecemeal fashion. Once the deviated portions were removed, a straight midline septum was achieved. The septum was then quilted with 4-0 plain gut sutures. The hemitransfixion incision was closed with interrupted 4-0 chromic sutures.   The inferior one half of both hypertrophied inferior turbinate was crossclamped with a Kelly clamp. The inferior one half of each inferior turbinate was then resected with a pair of cross cutting scissors. Hemostasis was achieved with a suction cautery device. Doyle splints were applied to the nasal septum.  The care of the patient was turned over to the anesthesiologist. The patient was awakened from anesthesia without difficulty. The patient was extubated and transferred to the recovery room in good condition.   OPERATIVE FINDINGS: Nasal septal deviation and bilateral inferior turbinate hypertrophy.   SPECIMEN: None.   FOLLOWUP CARE: The patient will be observed overnight due to her sleep apnea and COPD.  Samar Dass Raynelle Bring, MD

## 2020-02-17 NOTE — Progress Notes (Signed)
Pt arrived to room 6N12 via stretcher after surgery. Received report from Kyle, RN in PACU. See assessment. Will continue to monitor.

## 2020-02-17 NOTE — Discharge Instructions (Addendum)
POSTOPERATIVE INSTRUCTIONS FOR PATIENTS HAVING NASAL OR SINUS OPERATIONS ACTIVITY: Restrict activity at home for the first two days, resting as much as possible. Light activity is best. You may usually return to work within a week. You should refrain from nose blowing, strenuous activity, or heavy lifting greater than 20lbs for a total of one week after your operation.  If sneezing cannot be avoided, sneeze with your mouth open. DISCOMFORT: You may experience a dull headache and pressure along with nasal congestion and discharge. These symptoms may be worse during the first week after the operation but may last as long as two to four weeks.  Please take Tylenol or the pain medication that has been prescribed for you. Do not take aspirin or aspirin containing medications since they may cause bleeding.  You may experience symptoms of post nasal drainage, nasal congestion, headaches and fatigue for two or three months after your operation.  BLEEDING: You may have some blood tinged nasal drainage for approximately two weeks after the operation.  The discharge will be worse for the first week.  Please call our office at [phone] or go to the nearest hospital emergency room if you experience any of the following: heavy, bright red blood from your nose or mouth that lasts longer than 15 minutes or coughing up or vomiting bright red blood or blood clots. GENERAL CONSIDERATIONS: 1. A gauze dressing will be placed on your upper lip to absorb any drainage after the operation. You may need to change this several times a day.  If you do not have very much drainage, you may remove the dressing.  Remember that you may gently wipe your nose with a tissue and sniff in, but DO NOT blow your nose. 2. Please keep all of your postoperative appointments.  Your final results after the operation will depend on proper follow-up.  The initial visit is usually 2 to 5 days after the operation.  During this visit, the remaining nasal  packing and internal septal splints will be removed.  Your nasal and sinus cavities will be cleaned.  During the second visit, your nasal and sinus cavities will be cleaned again. Have someone drive you to your first two postoperative appointments.  3. How you care for your nose after the operation will influence the results that you obtain.  You should follow all directions, take your medication as prescribed, and call our office [phone] with any problems or questions. 4. You may be more comfortable sleeping with your head elevated on two pillows. 5. Do not take any medications that we have not prescribed or recommended. WARNING SIGNS: if any of the following should occur, please call our office: 1. Persistent fever greater than 102F. 2. Persistent vomiting. 3. Severe and constant pain that is not relieved by prescribed pain medication. 4. Trauma to the nose. 5. Rash or unusual side effects from any medicines.  

## 2020-02-18 ENCOUNTER — Encounter (HOSPITAL_COMMUNITY): Payer: Self-pay | Admitting: Otolaryngology

## 2020-02-18 DIAGNOSIS — J343 Hypertrophy of nasal turbinates: Secondary | ICD-10-CM | POA: Diagnosis not present

## 2020-02-18 DIAGNOSIS — J3489 Other specified disorders of nose and nasal sinuses: Secondary | ICD-10-CM | POA: Diagnosis not present

## 2020-02-18 DIAGNOSIS — Z7951 Long term (current) use of inhaled steroids: Secondary | ICD-10-CM | POA: Diagnosis not present

## 2020-02-18 DIAGNOSIS — Z79899 Other long term (current) drug therapy: Secondary | ICD-10-CM | POA: Diagnosis not present

## 2020-02-18 DIAGNOSIS — Z7952 Long term (current) use of systemic steroids: Secondary | ICD-10-CM | POA: Diagnosis not present

## 2020-02-18 DIAGNOSIS — I10 Essential (primary) hypertension: Secondary | ICD-10-CM | POA: Diagnosis not present

## 2020-02-18 DIAGNOSIS — J342 Deviated nasal septum: Secondary | ICD-10-CM | POA: Diagnosis not present

## 2020-02-18 DIAGNOSIS — Z794 Long term (current) use of insulin: Secondary | ICD-10-CM | POA: Diagnosis not present

## 2020-02-18 DIAGNOSIS — J449 Chronic obstructive pulmonary disease, unspecified: Secondary | ICD-10-CM | POA: Diagnosis not present

## 2020-02-18 LAB — GLUCOSE, CAPILLARY: Glucose-Capillary: 217 mg/dL — ABNORMAL HIGH (ref 70–99)

## 2020-02-18 NOTE — Plan of Care (Signed)
  Problem: Education: Goal: Knowledge of General Education information will improve Description: Including pain rating scale, medication(s)/side effects and non-pharmacologic comfort measures Outcome: Progressing   Problem: Health Behavior/Discharge Planning: Goal: Ability to manage health-related needs will improve Outcome: Progressing   Problem: Clinical Measurements: Goal: Ability to maintain clinical measurements within normal limits will improve Outcome: Progressing Goal: Respiratory complications will improve Outcome: Progressing   Problem: Activity: Goal: Risk for activity intolerance will decrease Outcome: Progressing   Problem: Nutrition: Goal: Adequate nutrition will be maintained Outcome: Progressing   Problem: Coping: Goal: Level of anxiety will decrease Outcome: Progressing   Problem: Elimination: Goal: Will not experience complications related to urinary retention Outcome: Progressing   Problem: Pain Managment: Goal: General experience of comfort will improve Outcome: Progressing   Problem: Safety: Goal: Ability to remain free from injury will improve Outcome: Progressing   Problem: Skin Integrity: Goal: Risk for impaired skin integrity will decrease Outcome: Progressing

## 2020-02-18 NOTE — Discharge Summary (Signed)
Physician Discharge Summary  Patient ID: Amy Cobb MRN: [id number] DOB/AGE: 02/12/1961 59 y.o.  Admit date: 02/17/2020 Discharge date: 02/18/2020  Admission Diagnoses: Chronic nasal obstruction  Discharge Diagnoses: Chronic nasal obstruction Active Problems:   S/P nasal septoplasty   Discharged Condition: good  Hospital Course: Pt had an uneventful overnight stay. No respiratory difficulty. No stridor.  Consults: None  Significant Diagnostic Studies: None  Treatments: surgery: Septoplasty and turbinate reduction  Discharge Exam: Blood pressure 139/64, pulse 87, temperature 98.6 F (37 C), resp. rate 16, height 5\' 7"  (1.702 m), weight 77.8 kg, SpO2 97 %.    Disposition: Discharge disposition: 01-Home or Self Care       Discharge Instructions    Activity as tolerated - No restrictions   Complete by: As directed    Diet general   Complete by: As directed    No wound care   Complete by: As directed      Allergies as of 02/18/2020      Reactions   Augmentin [amoxicillin-pot Clavulanate] Anaphylaxis, Other (See Comments)   Hallucinations.   Penicillins Anaphylaxis, Nausea And Vomiting   Hallucinations.   Gabapentin    Hallucinate    Moxifloxacin Hives   Novolog [insulin Aspart] Rash      Medication List    STOP taking these medications   Fish Oil + D3 1000-1000 MG-UNIT Caps     TAKE these medications   Admelog SoloStar 100 UNIT/ML KwikPen Generic drug: insulin lispro Inject 15 Units into the skin 3 (three) times daily.   albuterol 108 (90 Base) MCG/ACT inhaler Commonly known as: VENTOLIN HFA Inhale 2 puffs into the lungs every 6 (six) hours as needed for wheezing or shortness of breath.   albuterol (2.5 MG/3ML) 0.083% nebulizer solution Commonly known as: PROVENTIL Take 2.5 mg by nebulization every 8 (eight) hours as needed for wheezing or shortness of breath.   brimonidine 0.2 % ophthalmic solution Commonly known as: ALPHAGAN Place 1 drop  into the right eye in the morning and at bedtime.   budesonide-formoterol 160-4.5 MCG/ACT inhaler Commonly known as: SYMBICORT Inhale 2 puffs into the lungs 2 (two) times daily.   chlorthalidone 25 MG tablet Commonly known as: HYGROTON Take 25 mg by mouth daily.   clindamycin 300 MG capsule Commonly known as: Cleocin Take 1 capsule (300 mg total) by mouth 3 (three) times daily for 3 days.   cloNIDine 0.2 MG tablet Commonly known as: CATAPRES Take 1 tablet (0.2 mg total) by mouth 3 (three) times daily.   cyanocobalamin 1000 MCG/ML injection Commonly known as: (VITAMIN B-12) Inject 1,000 mcg as directed every 30 (thirty) days.   dorzolamide 2 % ophthalmic solution Commonly known as: TRUSOPT Place 1 drop into the right eye in the morning and at bedtime.   Eylea 2 MG/0.05ML Soln Generic drug: Aflibercept 1 each by Intravitreal route See admin instructions. Every 5 weeks   famotidine 20 MG tablet Commonly known as: PEPCID Take 20 mg by mouth at bedtime.   fluticasone 50 MCG/ACT nasal spray Commonly known as: FLONASE Place 1 spray into both nostrils daily.   hydrALAZINE 50 MG tablet Commonly known as: APRESOLINE TAKE 1 TABLET (50 MG TOTAL) BY MOUTH 3 (THREE) TIMES DAILY.   insulin glargine 100 UNIT/ML injection Commonly known as: LANTUS Inject 50 Units into the skin 2 (two) times daily.   labetalol 200 MG tablet Commonly known as: NORMODYNE Take 2 tablets (400 mg total) by mouth 2 (two) times daily.   losartan 100  MG tablet Commonly known as: COZAAR TAKE 1 TABLET EVERY DAY   meclizine 25 MG tablet Commonly known as: ANTIVERT Take 25 mg by mouth 3 (three) times daily as needed for dizziness.   methylPREDNISolone 4 MG tablet Commonly known as: MEDROL Take 4 mg by mouth See admin instructions. Take 6 tablets day 1, then 5 tabs day 2, then 4 tabs day 3, then 3 tabs day 4, then 2 tabs day 5 and 1 tab day 6.   minoxidil 2.5 MG tablet Commonly known as: LONITEN Take  2.5 mg by mouth daily.   oxyCODONE-acetaminophen 5-325 MG tablet Commonly known as: Percocet Take 1 tablet by mouth every 4 (four) hours as needed for up to 3 days for severe pain.   tiotropium 18 MCG inhalation capsule Commonly known as: SPIRIVA Place 18 mcg into inhaler and inhale daily.       Follow-up Information    Leta Baptist, MD On 02/19/2020.   Specialty: Otolaryngology Why: at 1:20pm Contact information: [address] [place] [address] [phone]               Signed: Burley Saver 02/18/2020, 11:33 AM

## 2020-02-18 NOTE — Progress Notes (Signed)
AVS given and reviewed with pt. Medications discussed. All questions answered to satisfaction. Pt verbalized understanding of information given. Pt escorted off the unit with all belongings via wheelchair by staff member.  

## 2020-02-18 NOTE — Progress Notes (Addendum)
Inpatient Diabetes Program Recommendations  AACE/ADA: New Consensus Statement on Inpatient Glycemic Control (2015)  Target Ranges:  Prepandial:   less than 140 mg/dL      Peak postprandial:   less than 180 mg/dL (1-2 hours)      Critically ill patients:  140 - 180 mg/dL   Lab Results  Component Value Date   GLUCAP 217 (H) 02/18/2020   HGBA1C 8.0 (H) 02/12/2020    Review of Glycemic Control Results for BATINA, DOUGAN (MRN [id number]) as of 02/18/2020 10:24  Ref. Range 02/12/2020 13:15 02/17/2020 07:08 02/17/2020 09:29 02/17/2020 21:35 02/18/2020 08:13  Glucose-Capillary Latest Ref Range: 70 - 99 mg/dL 137 (H) 224 (H) 185 (H) 374 (H) 217 (H)   Diabetes history:  DM2  Outpatient Diabetes medications:  Lantus 50 units bid Lispro 15 units tid  Current orders for Inpatient glycemic control:  Lantus 50 units bid Novolog 15 units tid  Inpatient Diabetes Program Recommendations:     Patient may not be eating as well post op.  Please consider changing Novolog from 15 tid to 0-15 units tid and changing diet to carb modified  Will continue to follow while inpatient.  Thank you, Reche Dixon, RN, BSN Diabetes Coordinator Inpatient Diabetes Program [phone] (team pager from 8a-5p)

## 2020-02-29 ENCOUNTER — Telehealth: Payer: Self-pay | Admitting: Cardiology

## 2020-02-29 MED ORDER — HYDRALAZINE HCL 50 MG PO TABS: 50.0000 mg | ORAL_TABLET | Freq: Three times a day (TID) | ORAL | 3 refills | Status: DC

## 2020-02-29 NOTE — Telephone Encounter (Signed)
New message    New script needs sent for this medication  *STAT* If patient is at the pharmacy, call can be transferred to refill team.   1. Which medications need to be refilled? (please list name of each medication and dose if known)  hydrALAZINE (APRESOLINE) 50 MG tablet(Expired) TAKE 1 TABLET (50 MG TOTAL) BY MOUTH 3 (THREE) TIMES DAILY.     2. Which pharmacy/location (including street and city if local pharmacy) is medication to be sent to? [place]   3. Do they need a 30 day or 90 day supply?  [place]

## 2020-02-29 NOTE — Telephone Encounter (Signed)
Done

## 2020-03-02 DIAGNOSIS — J449 Chronic obstructive pulmonary disease, unspecified: Secondary | ICD-10-CM | POA: Diagnosis not present

## 2020-03-14 DIAGNOSIS — G4733 Obstructive sleep apnea (adult) (pediatric): Secondary | ICD-10-CM | POA: Diagnosis not present

## 2020-03-16 ENCOUNTER — Ambulatory Visit (INDEPENDENT_AMBULATORY_CARE_PROVIDER_SITE_OTHER): Payer: Medicare HMO | Admitting: Podiatrist

## 2020-03-16 ENCOUNTER — Encounter: Payer: Self-pay | Admitting: Podiatrist

## 2020-03-16 ENCOUNTER — Other Ambulatory Visit: Payer: Self-pay

## 2020-03-16 DIAGNOSIS — I10 Essential (primary) hypertension: Secondary | ICD-10-CM | POA: Diagnosis not present

## 2020-03-16 DIAGNOSIS — M2142 Flat foot [pes planus] (acquired), left foot: Secondary | ICD-10-CM | POA: Diagnosis not present

## 2020-03-16 DIAGNOSIS — E119 Type 2 diabetes mellitus without complications: Secondary | ICD-10-CM | POA: Diagnosis not present

## 2020-03-16 DIAGNOSIS — E0840 Diabetes mellitus due to underlying condition with diabetic neuropathy, unspecified: Secondary | ICD-10-CM

## 2020-03-16 DIAGNOSIS — M2141 Flat foot [pes planus] (acquired), right foot: Secondary | ICD-10-CM

## 2020-03-16 DIAGNOSIS — F411 Generalized anxiety disorder: Secondary | ICD-10-CM | POA: Diagnosis not present

## 2020-03-16 MED ORDER — DULOXETINE HCL 30 MG PO CPEP: 30.0000 mg | ORAL_CAPSULE | Freq: Two times a day (BID) | ORAL | 2 refills | Status: DC

## 2020-03-16 NOTE — Patient Instructions (Signed)

## 2020-03-16 NOTE — Progress Notes (Signed)
°  Chief Complaint  Patient presents with   Foot Pain    Pt is here for bil foot neuropathy. Primarily located at the plantar forefoot down to the bottom of the heel. Pt states that it has been going on for around 5 years. Pt also states that she is interested in possible orthotics.     HPI: Patient is 59 y.o. female who presents today for the concerns as listed above.  She relates her feet have been bothering her with sharp, shooting, stabbing and bee sting type pains her for about 5 years. She tried a cream with Gabapentin in the medication and relates she was unable to tolerate due to the way it made her feel "off".  She has tried no other treatment. She also relates she has flat feet and is interested in orthotics.     Review of Systems No fevers, chills, nausea, muscle aches, no difficulty breathing, no calf pain, no chest pain or shortness of breath.   Physical Exam  GENERAL APPEARANCE: Alert, conversant. Appropriately groomed. No acute distress.   VASCULAR: Pedal pulses palpable DP and PT bilateral.  Capillary refill time is immediate to all digits,  Proximal to distal cooling it warm to warm.  Digital perfusion adequate.   NEUROLOGIC: sensation is intact epicritically and protectively to 5.07 monofilament at 5/5 sites bilateral.  Light touch is intact bilateral, vibratory sensation intact bilateral, achilles tendon reflex is intact bilateral.   MUSCULOSKELETAL: acceptable muscle strength, tone and stability bilateral.  Pes planus foot type is noted bilateral.  No gross boney pedal deformities noted.  First MPJ motion decreased bilaterally.  DERMATOLOGIC: skin is warm, supple, and dry.  No open lesions noted.  No rash, no pre ulcerative lesions. Digital nails are asymptomatic.      Assessment     ICD-10-CM   1. Diabetes mellitus due to underlying condition with diabetic neuropathy, unspecified whether long term insulin use (HCC)  E08.40   2. Pes planus of both feet  M21.41     M21.42      Plan  Discussed treatment options-  Discussed a trial of cymbalta and rx called in for cymbalta to start at 64mq daily and may increase to bid if she is comfortable.  She will also see Liliane Channel and he will get her fitted for diabetic shoes with orthotic inserts.

## 2020-03-22 ENCOUNTER — Encounter: Payer: Self-pay | Admitting: Cardiology

## 2020-03-22 DIAGNOSIS — F411 Generalized anxiety disorder: Secondary | ICD-10-CM | POA: Diagnosis not present

## 2020-03-22 DIAGNOSIS — J309 Allergic rhinitis, unspecified: Secondary | ICD-10-CM | POA: Diagnosis not present

## 2020-03-22 DIAGNOSIS — Z6827 Body mass index (BMI) 27.0-27.9, adult: Secondary | ICD-10-CM | POA: Diagnosis not present

## 2020-03-22 DIAGNOSIS — E1143 Type 2 diabetes mellitus with diabetic autonomic (poly)neuropathy: Secondary | ICD-10-CM | POA: Diagnosis not present

## 2020-03-22 DIAGNOSIS — M79671 Pain in right foot: Secondary | ICD-10-CM | POA: Diagnosis not present

## 2020-03-22 DIAGNOSIS — I10 Essential (primary) hypertension: Secondary | ICD-10-CM | POA: Diagnosis not present

## 2020-03-22 DIAGNOSIS — J440 Chronic obstructive pulmonary disease with acute lower respiratory infection: Secondary | ICD-10-CM | POA: Diagnosis not present

## 2020-03-23 DIAGNOSIS — E113313 Type 2 diabetes mellitus with moderate nonproliferative diabetic retinopathy with macular edema, bilateral: Secondary | ICD-10-CM | POA: Diagnosis not present

## 2020-04-02 DIAGNOSIS — J449 Chronic obstructive pulmonary disease, unspecified: Secondary | ICD-10-CM | POA: Diagnosis not present

## 2020-04-13 DIAGNOSIS — E1143 Type 2 diabetes mellitus with diabetic autonomic (poly)neuropathy: Secondary | ICD-10-CM | POA: Diagnosis not present

## 2020-04-13 DIAGNOSIS — I10 Essential (primary) hypertension: Secondary | ICD-10-CM | POA: Diagnosis not present

## 2020-04-14 ENCOUNTER — Other Ambulatory Visit: Payer: Medicare HMO

## 2020-04-14 ENCOUNTER — Other Ambulatory Visit: Payer: Self-pay

## 2020-04-14 DIAGNOSIS — Z20822 Contact with and (suspected) exposure to covid-19: Secondary | ICD-10-CM

## 2020-04-15 LAB — NOVEL CORONAVIRUS, NAA: SARS-CoV-2, NAA: DETECTED — AB

## 2020-04-15 LAB — SARS-COV-2, NAA 2 DAY TAT

## 2020-04-16 ENCOUNTER — Telehealth: Payer: Self-pay | Admitting: Nurse Practitioner

## 2020-04-16 DIAGNOSIS — R0902 Hypoxemia: Secondary | ICD-10-CM | POA: Insufficient documentation

## 2020-04-16 DIAGNOSIS — F419 Anxiety disorder, unspecified: Secondary | ICD-10-CM | POA: Diagnosis not present

## 2020-04-16 DIAGNOSIS — R05 Cough: Secondary | ICD-10-CM | POA: Diagnosis not present

## 2020-04-16 DIAGNOSIS — J1282 Pneumonia due to coronavirus disease 2019: Secondary | ICD-10-CM | POA: Diagnosis not present

## 2020-04-16 DIAGNOSIS — U071 COVID-19: Secondary | ICD-10-CM | POA: Insufficient documentation

## 2020-04-16 DIAGNOSIS — J440 Chronic obstructive pulmonary disease with acute lower respiratory infection: Secondary | ICD-10-CM | POA: Diagnosis not present

## 2020-04-16 DIAGNOSIS — Z87891 Personal history of nicotine dependence: Secondary | ICD-10-CM | POA: Diagnosis not present

## 2020-04-16 DIAGNOSIS — E876 Hypokalemia: Secondary | ICD-10-CM | POA: Diagnosis not present

## 2020-04-16 DIAGNOSIS — E119 Type 2 diabetes mellitus without complications: Secondary | ICD-10-CM | POA: Diagnosis not present

## 2020-04-16 DIAGNOSIS — Z794 Long term (current) use of insulin: Secondary | ICD-10-CM | POA: Diagnosis not present

## 2020-04-16 DIAGNOSIS — A419 Sepsis, unspecified organism: Secondary | ICD-10-CM | POA: Diagnosis not present

## 2020-04-16 DIAGNOSIS — I10 Essential (primary) hypertension: Secondary | ICD-10-CM | POA: Diagnosis not present

## 2020-04-16 DIAGNOSIS — R6520 Severe sepsis without septic shock: Secondary | ICD-10-CM | POA: Diagnosis not present

## 2020-04-16 DIAGNOSIS — J449 Chronic obstructive pulmonary disease, unspecified: Secondary | ICD-10-CM | POA: Diagnosis not present

## 2020-04-16 NOTE — Telephone Encounter (Signed)
Called to discuss with Amy Cobb about Covid symptoms and the use of casirivimab/imdevimab, a combination monoclonal antibody infusion for those with mild to moderate Covid symptoms and at a high risk of hospitalization.     Pt is qualified for this infusion at the [facility] infusion center due to co-morbid conditions (as indicated below)   Unable to reach. MyChart message sent.    Patient Active Problem List   Diagnosis Date Noted  . S/P nasal septoplasty 02/17/2020  . Essential hypertension, benign 01/15/2014  . Type 2 diabetes mellitus ([place]) 01/15/2014  . Precordial pain 01/14/2014    Amy Cobb, AGPCNP-BC

## 2020-04-17 DIAGNOSIS — E119 Type 2 diabetes mellitus without complications: Secondary | ICD-10-CM | POA: Diagnosis not present

## 2020-04-17 DIAGNOSIS — U071 COVID-19: Secondary | ICD-10-CM | POA: Diagnosis not present

## 2020-04-17 DIAGNOSIS — J1282 Pneumonia due to coronavirus disease 2019: Secondary | ICD-10-CM | POA: Diagnosis not present

## 2020-04-17 DIAGNOSIS — I10 Essential (primary) hypertension: Secondary | ICD-10-CM | POA: Diagnosis not present

## 2020-04-18 DIAGNOSIS — U071 COVID-19: Secondary | ICD-10-CM | POA: Diagnosis not present

## 2020-04-18 DIAGNOSIS — F419 Anxiety disorder, unspecified: Secondary | ICD-10-CM | POA: Diagnosis not present

## 2020-04-18 DIAGNOSIS — E876 Hypokalemia: Secondary | ICD-10-CM | POA: Diagnosis not present

## 2020-04-18 DIAGNOSIS — R0902 Hypoxemia: Secondary | ICD-10-CM | POA: Diagnosis not present

## 2020-04-18 DIAGNOSIS — J449 Chronic obstructive pulmonary disease, unspecified: Secondary | ICD-10-CM | POA: Diagnosis not present

## 2020-04-18 DIAGNOSIS — E119 Type 2 diabetes mellitus without complications: Secondary | ICD-10-CM | POA: Diagnosis not present

## 2020-04-19 DIAGNOSIS — E119 Type 2 diabetes mellitus without complications: Secondary | ICD-10-CM | POA: Diagnosis not present

## 2020-04-19 DIAGNOSIS — I10 Essential (primary) hypertension: Secondary | ICD-10-CM | POA: Diagnosis not present

## 2020-04-19 DIAGNOSIS — U071 COVID-19: Secondary | ICD-10-CM | POA: Diagnosis not present

## 2020-04-19 DIAGNOSIS — R0902 Hypoxemia: Secondary | ICD-10-CM | POA: Diagnosis not present

## 2020-04-19 DIAGNOSIS — F419 Anxiety disorder, unspecified: Secondary | ICD-10-CM | POA: Diagnosis not present

## 2020-04-19 DIAGNOSIS — J449 Chronic obstructive pulmonary disease, unspecified: Secondary | ICD-10-CM | POA: Diagnosis not present

## 2020-04-28 DIAGNOSIS — Z6826 Body mass index (BMI) 26.0-26.9, adult: Secondary | ICD-10-CM | POA: Diagnosis not present

## 2020-04-28 DIAGNOSIS — U071 COVID-19: Secondary | ICD-10-CM | POA: Diagnosis not present

## 2020-05-03 DIAGNOSIS — J449 Chronic obstructive pulmonary disease, unspecified: Secondary | ICD-10-CM | POA: Diagnosis not present

## 2020-05-04 ENCOUNTER — Ambulatory Visit: Payer: Medicare HMO | Admitting: Orthotics

## 2020-05-04 ENCOUNTER — Other Ambulatory Visit: Payer: Self-pay

## 2020-05-04 DIAGNOSIS — M2011 Hallux valgus (acquired), right foot: Secondary | ICD-10-CM

## 2020-05-04 DIAGNOSIS — M2141 Flat foot [pes planus] (acquired), right foot: Secondary | ICD-10-CM

## 2020-05-04 DIAGNOSIS — E119 Type 2 diabetes mellitus without complications: Secondary | ICD-10-CM

## 2020-05-04 DIAGNOSIS — M2142 Flat foot [pes planus] (acquired), left foot: Secondary | ICD-10-CM

## 2020-05-09 ENCOUNTER — Encounter: Payer: Self-pay | Admitting: Cardiology

## 2020-05-09 NOTE — Progress Notes (Signed)
Virtual Visit via Telephone Note   This visit type was conducted due to national recommendations for restrictions regarding the COVID-19 Pandemic (e.g. social distancing) in an effort to limit this patient's exposure and mitigate transmission in our community.  Due to her co-morbid illnesses, this patient is at least at moderate risk for complications without adequate follow up.  This format is felt to be most appropriate for this patient at this time.  The patient did not have access to video technology/had technical difficulties with video requiring transitioning to audio format only (telephone).  All issues noted in this document were discussed and addressed.  No physical exam could be performed with this format.  Please refer to the patient's chart for her  consent to telehealth for [facility].    Date:  05/10/2020   ID:  Amy Cobb, DOB Aug 30, 1960, MRN [id number] The patient was identified using 2 identifiers.  Patient Location: Home Provider Location: Office/Clinic  PCP:  Neale Burly, MD  Cardiologist:  Rozann Lesches, MD Electrophysiologist:  None    Evaluation Performed:  Follow-Up Visit  Chief Complaint:   Cardiac follow-up  History of Present Illness:    Amy Cobb is a 59 y.o. female last seen in the office back in March by Ms. Strader PA-C.  We spoke by phone today for a routine visit. Chart review finds diagnosis with COVID-19 pneumonia in late August.  Note reviewed regarding ER evaluation at [facility].  She tells me that she was hospitalized for 5 days, has been subsequently recuperating at home, has supplemental oxygen, completed steroids and course of Eliquis.  She is following with Dr. Sherrie Sport.  She states that her blood pressure has been adequately controlled on multimodal therapy, reflected in measurement outlined below today.  She also states that her insurance company sent her a prescription for atorvastatin 20 mg daily, presumably for  cardiovascular risk reduction in the setting of type 2 diabetes mellitus.  I have asked her to check with her PCP regarding lipid status, specifically LDL level to better gauge dose of statin going forward.   Past Medical History:  Diagnosis Date  . Acid reflux   . Anxiety   . Asthma   . COPD (chronic obstructive pulmonary disease) ([place])   . Depression   . Essential hypertension   . OSA on CPAP   . Type 2 diabetes mellitus ([place])   . Vulvar cancer ([place]) 2003   Past Surgical History:  Procedure Laterality Date  . ABDOMINAL HYSTERECTOMY    . BREAST CYST EXCISION Right   . CERVICAL FUSION    . CO2 LASER APPLICATION  6659   CO2 LASER VAPORIZATION OF THE VULVAR LESION  . NASAL SEPTOPLASTY W/ TURBINOPLASTY N/A 02/17/2020   Procedure: BILATERAL NASAL SEPTOPLASTY;  Surgeon: Leta Baptist, MD;  Location: [place];  Service: ENT;  Laterality: N/A;  . SHOULDER SURGERY    . TURBINATE REDUCTION Bilateral 02/17/2020   Procedure: BILATERAL TURBINATE REDUCTION;  Surgeon: Leta Baptist, MD;  Location: [facility];  Service: ENT;  Laterality: Bilateral;     Current Meds  Medication Sig  . Aflibercept (EYLEA) 2 MG/0.05ML SOLN 1 each by Intravitreal route See admin instructions. Every 5 weeks  . albuterol (PROVENTIL HFA;VENTOLIN HFA) 108 (90 BASE) MCG/ACT inhaler Inhale 2 puffs into the lungs every 6 (six) hours as needed for wheezing or shortness of breath.  Marland Kitchen albuterol (PROVENTIL) (2.5 MG/3ML) 0.083% nebulizer solution Take 2.5 mg by nebulization every 8 (eight) hours as  needed for wheezing or shortness of breath.   . brimonidine (ALPHAGAN) 0.2 % ophthalmic solution Place 1 drop into the right eye in the morning and at bedtime.  . budesonide-formoterol (SYMBICORT) 160-4.5 MCG/ACT inhaler Inhale 2 puffs into the lungs 2 (two) times daily.  . chlorthalidone (HYGROTON) 25 MG tablet Take 25 mg by mouth daily.  . cloNIDine (CATAPRES) 0.2 MG tablet Take 1 tablet (0.2 mg total) by mouth 3 (three) times daily.  .  cyanocobalamin (,VITAMIN B-12,) 1000 MCG/ML injection Inject 1,000 mcg as directed every 30 (thirty) days.  . dorzolamide (TRUSOPT) 2 % ophthalmic solution Place 1 drop into the right eye in the morning and at bedtime.  . famotidine (PEPCID) 20 MG tablet Take 20 mg by mouth at bedtime.  . fluticasone (FLONASE) 50 MCG/ACT nasal spray Place 1 spray into both nostrils daily.  . hydrALAZINE (APRESOLINE) 50 MG tablet Take 1 tablet (50 mg total) by mouth 3 (three) times daily.  . insulin glargine (LANTUS) 100 UNIT/ML injection Inject 50 Units into the skin 2 (two) times daily.  . insulin lispro (ADMELOG SOLOSTAR) 100 UNIT/ML KwikPen Inject 15 Units into the skin 3 (three) times daily.   Marland Kitchen labetalol (NORMODYNE) 200 MG tablet Take 2 tablets (400 mg total) by mouth 2 (two) times daily.  Marland Kitchen losartan (COZAAR) 100 MG tablet TAKE 1 TABLET EVERY DAY (Patient taking differently: Take 100 mg by mouth daily. )  . meclizine (ANTIVERT) 25 MG tablet Take 25 mg by mouth 3 (three) times daily as needed for dizziness.  . minoxidil (LONITEN) 2.5 MG tablet Take 2.5 mg by mouth daily.  Marland Kitchen tiotropium (SPIRIVA) 18 MCG inhalation capsule Place 18 mcg into inhaler and inhale daily.     Allergies:   Augmentin [amoxicillin-pot clavulanate], Penicillins, Clavulanic acid, Gabapentin, Moxifloxacin, and Novolog [insulin aspart]   ROS:   Gradually improving shortness of breath.  Prior CV studies:   The following studies were reviewed today:  Echocardiogram 10/24/2018: 1. The left ventricle has normal systolic function with an ejection  fraction of 60-65%. The cavity size was normal. There is mildly increased  left ventricular wall thickness. Left ventricular diastolic Doppler  parameters are consistent with impaired  relaxation.  2. The right ventricle has normal systolic function. The cavity was  normal. There is no increase in right ventricular wall thickness.  3. The mitral valve is normal in structure. No evidence of  mitral valve  stenosis.  4. The tricuspid valve is normal in structure.  5. The aortic valve is tricuspid.  6. The aortic root is normal in size and structure.   Chest x-ray 04/16/2020 [facility]): FINDINGS:  Normal heart size. Airspace opacity within the right midlung and  left lung base identified compatible with multifocal pneumonia. No  pleural effusion or edema identified.   IMPRESSION:  Right midlung and left lung base airspace opacities compatible with  multifocal pneumonia.    Labs/Other Tests and Data Reviewed:    EKG:  An ECG dated 02/12/2020 was personally reviewed today and demonstrated:  Sinus rhythm with leftward axis and decreased R wave progression.  Recent Labs: 02/12/2020: BUN 15; Creatinine, Ser 0.78; Hemoglobin 12.4; Platelets 349; Potassium 3.8; Sodium 141   Wt Readings from Last 3 Encounters:  05/10/20 168 lb (76.2 kg)  02/17/20 171 lb 8.3 oz (77.8 kg)  02/12/20 171 lb 8.3 oz (77.8 kg)     Objective:    Vital Signs:  BP 128/78   Pulse 80   Ht 5\' 7"  (  1.702 m)   Wt 168 lb (76.2 kg)   BMI 26.31 kg/m    Patient spoke in full sentences, not short of breath on the phone.  ASSESSMENT & PLAN:    1.  Essential hypertension on multimodal therapy, blood pressure is adequately controlled today and she states that it has been in good range recently.  She is currently on chlorthalidone, clonidine, hydralazine, Cozaar, and labetalol.  I reviewed her recent lab work.  Keep follow-up with PCP.  2.  Recent diagnosis of COVID-19 pneumonia as discussed above.  Recuperating at home at this point and states that she is feeling better. Keep follow-up with PCP.  3.  Type 2 diabetes mellitus with follow-up by Dr. Sherrie Sport.  Patient states that she was sent a prescription for atorvastatin 20 mg daily by insurance provider, I asked her to clarify with her PCP actual lipid status in terms of LDL to better understand best statin dose going forward for risk  reduction.  Time:   Today, I have spent 8 minutes with the patient with telehealth technology discussing the above problems.     Medication Adjustments/Labs and Tests Ordered: Current medicines are reviewed at length with the patient today.  Concerns regarding medicines are outlined above.   Tests Ordered: No orders of the defined types were placed in this encounter.   Medication Changes: No orders of the defined types were placed in this encounter.   Follow Up:  6 months.   Signed, Rozann Lesches, MD  05/10/2020 10:41 AM    [place]

## 2020-05-10 ENCOUNTER — Telehealth: Payer: Self-pay | Admitting: Cardiology

## 2020-05-10 ENCOUNTER — Telehealth (INDEPENDENT_AMBULATORY_CARE_PROVIDER_SITE_OTHER): Payer: Medicare HMO | Admitting: Cardiology

## 2020-05-10 ENCOUNTER — Encounter: Payer: Self-pay | Admitting: Cardiology

## 2020-05-10 ENCOUNTER — Other Ambulatory Visit: Payer: Self-pay

## 2020-05-10 ENCOUNTER — Encounter: Payer: Self-pay | Admitting: *Deleted

## 2020-05-10 VITALS — BP 128/78 | HR 80 | Ht 67.0 in | Wt 168.0 lb

## 2020-05-10 DIAGNOSIS — I10 Essential (primary) hypertension: Secondary | ICD-10-CM | POA: Diagnosis not present

## 2020-05-10 DIAGNOSIS — E119 Type 2 diabetes mellitus without complications: Secondary | ICD-10-CM

## 2020-05-10 MED ORDER — LOSARTAN POTASSIUM 100 MG PO TABS: 100.0000 mg | ORAL_TABLET | Freq: Every day | ORAL | 3 refills | Status: DC

## 2020-05-10 NOTE — Patient Instructions (Addendum)

## 2020-05-10 NOTE — Telephone Encounter (Signed)
Request refill be sent to [facility] for losartan. Advised that refill will be sent.

## 2020-05-10 NOTE — Telephone Encounter (Signed)
Pt has additional questions about her medication from virtual visit this morning    Please call [phone]    Thanks renee

## 2020-05-19 DIAGNOSIS — R0902 Hypoxemia: Secondary | ICD-10-CM | POA: Diagnosis not present

## 2020-05-19 DIAGNOSIS — J449 Chronic obstructive pulmonary disease, unspecified: Secondary | ICD-10-CM | POA: Diagnosis not present

## 2020-05-19 DIAGNOSIS — U071 COVID-19: Secondary | ICD-10-CM | POA: Diagnosis not present

## 2020-05-25 DIAGNOSIS — E113313 Type 2 diabetes mellitus with moderate nonproliferative diabetic retinopathy with macular edema, bilateral: Secondary | ICD-10-CM | POA: Diagnosis not present

## 2020-05-25 DIAGNOSIS — H40041 Steroid responder, right eye: Secondary | ICD-10-CM | POA: Diagnosis not present

## 2020-06-02 DIAGNOSIS — J449 Chronic obstructive pulmonary disease, unspecified: Secondary | ICD-10-CM | POA: Diagnosis not present

## 2020-06-03 DIAGNOSIS — I10 Essential (primary) hypertension: Secondary | ICD-10-CM | POA: Diagnosis not present

## 2020-06-03 DIAGNOSIS — J449 Chronic obstructive pulmonary disease, unspecified: Secondary | ICD-10-CM | POA: Diagnosis not present

## 2020-06-03 DIAGNOSIS — E1165 Type 2 diabetes mellitus with hyperglycemia: Secondary | ICD-10-CM | POA: Diagnosis not present

## 2020-06-14 DIAGNOSIS — G4733 Obstructive sleep apnea (adult) (pediatric): Secondary | ICD-10-CM | POA: Diagnosis not present

## 2020-06-14 DIAGNOSIS — J449 Chronic obstructive pulmonary disease, unspecified: Secondary | ICD-10-CM | POA: Diagnosis not present

## 2020-06-19 DIAGNOSIS — J449 Chronic obstructive pulmonary disease, unspecified: Secondary | ICD-10-CM | POA: Diagnosis not present

## 2020-06-19 DIAGNOSIS — R0902 Hypoxemia: Secondary | ICD-10-CM | POA: Diagnosis not present

## 2020-06-19 DIAGNOSIS — U071 COVID-19: Secondary | ICD-10-CM | POA: Diagnosis not present

## 2020-06-22 DIAGNOSIS — I10 Essential (primary) hypertension: Secondary | ICD-10-CM | POA: Diagnosis not present

## 2020-06-22 DIAGNOSIS — Z0000 Encounter for general adult medical examination without abnormal findings: Secondary | ICD-10-CM | POA: Diagnosis not present

## 2020-06-22 DIAGNOSIS — Z6828 Body mass index (BMI) 28.0-28.9, adult: Secondary | ICD-10-CM | POA: Diagnosis not present

## 2020-06-22 DIAGNOSIS — E1143 Type 2 diabetes mellitus with diabetic autonomic (poly)neuropathy: Secondary | ICD-10-CM | POA: Diagnosis not present

## 2020-06-22 DIAGNOSIS — K219 Gastro-esophageal reflux disease without esophagitis: Secondary | ICD-10-CM | POA: Diagnosis not present

## 2020-06-22 DIAGNOSIS — J440 Chronic obstructive pulmonary disease with acute lower respiratory infection: Secondary | ICD-10-CM | POA: Diagnosis not present

## 2020-06-23 ENCOUNTER — Other Ambulatory Visit: Payer: Self-pay | Admitting: Internal Medicine

## 2020-06-23 DIAGNOSIS — Z1231 Encounter for screening mammogram for malignant neoplasm of breast: Secondary | ICD-10-CM

## 2020-06-27 ENCOUNTER — Ambulatory Visit: Payer: Medicare HMO

## 2020-07-01 ENCOUNTER — Ambulatory Visit
Admission: RE | Admit: 2020-07-01 | Discharge: 2020-07-01 | Disposition: A | Discharge status: Home / Self Care | Payer: Medicare HMO | Source: Ambulatory Visit | Attending: Internal Medicine | Admitting: Internal Medicine

## 2020-07-01 ENCOUNTER — Other Ambulatory Visit: Payer: Self-pay

## 2020-07-01 DIAGNOSIS — Z1231 Encounter for screening mammogram for malignant neoplasm of breast: Secondary | ICD-10-CM | POA: Diagnosis not present

## 2020-07-03 DIAGNOSIS — J449 Chronic obstructive pulmonary disease, unspecified: Secondary | ICD-10-CM | POA: Diagnosis not present

## 2020-07-04 DIAGNOSIS — I10 Essential (primary) hypertension: Secondary | ICD-10-CM | POA: Diagnosis not present

## 2020-07-04 DIAGNOSIS — K219 Gastro-esophageal reflux disease without esophagitis: Secondary | ICD-10-CM | POA: Diagnosis not present

## 2020-07-04 DIAGNOSIS — E1143 Type 2 diabetes mellitus with diabetic autonomic (poly)neuropathy: Secondary | ICD-10-CM | POA: Diagnosis not present

## 2020-07-06 ENCOUNTER — Other Ambulatory Visit: Payer: Self-pay | Admitting: Cardiology

## 2020-07-06 DIAGNOSIS — E113313 Type 2 diabetes mellitus with moderate nonproliferative diabetic retinopathy with macular edema, bilateral: Secondary | ICD-10-CM | POA: Diagnosis not present

## 2020-07-11 ENCOUNTER — Telehealth: Payer: Self-pay | Admitting: Podiatrist

## 2020-07-11 NOTE — Telephone Encounter (Signed)
Pt called and left message stating the diabetic shoes she got are too big and the person that gave them to her was aware. She said they are hurttng her feet.  I returned call and patient has worn them out so they are not returnable but I told pt to schedule an appt with Liliane Channel and see if we can put some spacers in them to make them fit better. She said they are hurting the toes.  She is going to call back after the holidays to schedule an appt with Liliane Channel.  I also explained that as of January she qualifies for a new pair of shoes but she would have to have an appt with one of our providers first.

## 2020-07-19 DIAGNOSIS — J449 Chronic obstructive pulmonary disease, unspecified: Secondary | ICD-10-CM | POA: Diagnosis not present

## 2020-07-19 DIAGNOSIS — R0902 Hypoxemia: Secondary | ICD-10-CM | POA: Diagnosis not present

## 2020-07-19 DIAGNOSIS — U071 COVID-19: Secondary | ICD-10-CM | POA: Diagnosis not present

## 2020-08-02 DIAGNOSIS — J449 Chronic obstructive pulmonary disease, unspecified: Secondary | ICD-10-CM | POA: Diagnosis not present

## 2020-08-10 DIAGNOSIS — J449 Chronic obstructive pulmonary disease, unspecified: Secondary | ICD-10-CM | POA: Diagnosis not present

## 2020-08-10 DIAGNOSIS — G4733 Obstructive sleep apnea (adult) (pediatric): Secondary | ICD-10-CM | POA: Diagnosis not present

## 2020-08-10 DIAGNOSIS — U071 COVID-19: Secondary | ICD-10-CM | POA: Diagnosis not present

## 2020-08-10 DIAGNOSIS — R0902 Hypoxemia: Secondary | ICD-10-CM | POA: Diagnosis not present

## 2020-08-11 DIAGNOSIS — I10 Essential (primary) hypertension: Secondary | ICD-10-CM | POA: Diagnosis not present

## 2020-08-11 DIAGNOSIS — K219 Gastro-esophageal reflux disease without esophagitis: Secondary | ICD-10-CM | POA: Diagnosis not present

## 2020-08-11 DIAGNOSIS — E1143 Type 2 diabetes mellitus with diabetic autonomic (poly)neuropathy: Secondary | ICD-10-CM | POA: Diagnosis not present

## 2020-08-19 DIAGNOSIS — J449 Chronic obstructive pulmonary disease, unspecified: Secondary | ICD-10-CM | POA: Diagnosis not present

## 2020-08-19 DIAGNOSIS — U071 COVID-19: Secondary | ICD-10-CM | POA: Diagnosis not present

## 2020-08-19 DIAGNOSIS — R0902 Hypoxemia: Secondary | ICD-10-CM | POA: Diagnosis not present

## 2020-09-02 DIAGNOSIS — J449 Chronic obstructive pulmonary disease, unspecified: Secondary | ICD-10-CM | POA: Diagnosis not present

## 2020-09-12 DIAGNOSIS — J343 Hypertrophy of nasal turbinates: Secondary | ICD-10-CM | POA: Diagnosis not present

## 2020-09-12 DIAGNOSIS — J310 Chronic rhinitis: Secondary | ICD-10-CM | POA: Diagnosis not present

## 2020-09-15 DIAGNOSIS — J449 Chronic obstructive pulmonary disease, unspecified: Secondary | ICD-10-CM | POA: Diagnosis not present

## 2020-09-15 DIAGNOSIS — G4733 Obstructive sleep apnea (adult) (pediatric): Secondary | ICD-10-CM | POA: Diagnosis not present

## 2020-09-17 DIAGNOSIS — E1165 Type 2 diabetes mellitus with hyperglycemia: Secondary | ICD-10-CM | POA: Diagnosis not present

## 2020-09-17 DIAGNOSIS — I10 Essential (primary) hypertension: Secondary | ICD-10-CM | POA: Diagnosis not present

## 2020-09-17 DIAGNOSIS — J441 Chronic obstructive pulmonary disease with (acute) exacerbation: Secondary | ICD-10-CM | POA: Diagnosis not present

## 2020-09-19 DIAGNOSIS — U071 COVID-19: Secondary | ICD-10-CM | POA: Diagnosis not present

## 2020-09-19 DIAGNOSIS — R0902 Hypoxemia: Secondary | ICD-10-CM | POA: Diagnosis not present

## 2020-09-19 DIAGNOSIS — J449 Chronic obstructive pulmonary disease, unspecified: Secondary | ICD-10-CM | POA: Diagnosis not present

## 2020-09-21 DIAGNOSIS — I10 Essential (primary) hypertension: Secondary | ICD-10-CM | POA: Diagnosis not present

## 2020-09-21 DIAGNOSIS — E1143 Type 2 diabetes mellitus with diabetic autonomic (poly)neuropathy: Secondary | ICD-10-CM | POA: Diagnosis not present

## 2020-09-21 DIAGNOSIS — Z0000 Encounter for general adult medical examination without abnormal findings: Secondary | ICD-10-CM | POA: Diagnosis not present

## 2020-09-21 DIAGNOSIS — J440 Chronic obstructive pulmonary disease with acute lower respiratory infection: Secondary | ICD-10-CM | POA: Diagnosis not present

## 2020-09-21 DIAGNOSIS — K219 Gastro-esophageal reflux disease without esophagitis: Secondary | ICD-10-CM | POA: Diagnosis not present

## 2020-09-21 DIAGNOSIS — Z6827 Body mass index (BMI) 27.0-27.9, adult: Secondary | ICD-10-CM | POA: Diagnosis not present

## 2020-09-23 ENCOUNTER — Encounter: Payer: Self-pay | Admitting: Pulmonary Disease

## 2020-09-23 ENCOUNTER — Ambulatory Visit (HOSPITAL_COMMUNITY)
Admission: RE | Admit: 2020-09-23 | Discharge: 2020-09-23 | Disposition: A | Discharge status: Home / Self Care | Payer: Medicare HMO | Source: Ambulatory Visit | Attending: Pulmonary Disease | Admitting: Pulmonary Disease

## 2020-09-23 ENCOUNTER — Other Ambulatory Visit: Payer: Self-pay

## 2020-09-23 ENCOUNTER — Ambulatory Visit: Payer: Medicare HMO | Admitting: Pulmonary Disease

## 2020-09-23 VITALS — BP 132/78 | HR 92 | Temp 97.1°F | Ht 67.0 in | Wt 179.6 lb

## 2020-09-23 DIAGNOSIS — J9611 Chronic respiratory failure with hypoxia: Secondary | ICD-10-CM

## 2020-09-23 DIAGNOSIS — J4489 Other specified chronic obstructive pulmonary disease: Secondary | ICD-10-CM

## 2020-09-23 DIAGNOSIS — J449 Chronic obstructive pulmonary disease, unspecified: Secondary | ICD-10-CM | POA: Diagnosis not present

## 2020-09-23 DIAGNOSIS — J4531 Mild persistent asthma with (acute) exacerbation: Secondary | ICD-10-CM | POA: Insufficient documentation

## 2020-09-23 DIAGNOSIS — G4733 Obstructive sleep apnea (adult) (pediatric): Secondary | ICD-10-CM | POA: Diagnosis not present

## 2020-09-23 DIAGNOSIS — R0602 Shortness of breath: Secondary | ICD-10-CM | POA: Diagnosis not present

## 2020-09-23 DIAGNOSIS — R918 Other nonspecific abnormal finding of lung field: Secondary | ICD-10-CM | POA: Diagnosis not present

## 2020-09-23 NOTE — Assessment & Plan Note (Signed)
Seems like she was provided oxygen after Covid pneumonia and this was never discontinued. She does not desaturate on ambulation today and does not need oxygen the daytime. We will check nocturnal oximetry and if no significant desaturations can discontinue oxygen

## 2020-09-23 NOTE — Assessment & Plan Note (Signed)
Her smoking history is not very extensive.  Not convinced about diagnosis of COPD here.  Her respiratory problems seem to have occurred after moving to [place] and I wonder if this is asthma rather than COPD due to allergies.  However she never had a childhood history of asthma and this would be labeled as adult-onset. We will obtain PFTs to clarify and see if she has a  bronchodilator response.  She does seem to require frequent prednisone tapers during the year.  I will ask her to continue triple therapy for now with Symbicort and Spiriva and advice on this after PFTs. If she is felt to have asthma more than COPD then we will pursue allergy testing

## 2020-09-23 NOTE — Patient Instructions (Signed)
  AMbulatory sat Check ONO on RA  CXR today  Schedule pFTs Meanwhile stay on symbicort & spiriva  Obtain sleep studies from [place] back on CPAP machine

## 2020-09-23 NOTE — Assessment & Plan Note (Signed)
Obtain sleep studies to [facility] I advised her to get back on her CPAP machine

## 2020-09-23 NOTE — Progress Notes (Signed)
Subjective:    Patient ID: Amy Cobb, female    DOB: April 05, 1961, 60 y.o.   MRN: [id number]  HPI  Chief Complaint  Patient presents with  . Consult    Non productive cough, shortness of breath with activity and rest    60 year old ex-smoker presents to establish care for  COPD and OSA She smoked about a pack per week starting at age 16 until age 34, less than 10 pack years.  She reports that her breathing problem started when she moved from [place] to [place] in 2005.  She was formally diagnosed with COPD in 2010 by her PCP but no PFTs have been performed.  She reports needing prednisone tapers 5-6 times in the year and has been hospitalized at least once or twice every year.  She is maintained on a regimen of Symbicort and Spiriva and her PCP recently gave her a sample of Trelegy 200 to try. She was hospitalized with Covid pneumonia 03/2020 and was discharged on 4 L oxygen.  Symptoms have improved and she has been able to come off oxygen in the daytime but continues to use this at night.  However she was not told the blind oxygen into CPAP machine so she stopped using her CPAP machine that she had been on. OSA was diagnosed in 2019 and I note that compliance has been sporadic based on report from [facility].  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 Acid reflux   . Anxiety   . Asthma   . COPD (chronic obstructive pulmonary disease) ([place])   . Depression   . Essential hypertension   . OSA on CPAP   . Type 2 diabetes mellitus ([place])   . Vulvar  cancer ([place]) 2003   Past Surgical History:  Procedure Laterality Date  . ABDOMINAL HYSTERECTOMY    . BREAST CYST EXCISION Right   . CERVICAL FUSION    . CO2 LASER APPLICATION  8657   CO2 LASER VAPORIZATION OF THE VULVAR LESION  . NASAL SEPTOPLASTY W/ TURBINOPLASTY N/A 02/17/2020   Procedure: BILATERAL NASAL SEPTOPLASTY;  Surgeon: Leta Baptist, MD;  Location: [place];  Service: ENT;  Laterality: N/A;  . SHOULDER SURGERY    . TURBINATE REDUCTION Bilateral 02/17/2020   Procedure: BILATERAL TURBINATE REDUCTION;  Surgeon: Leta Baptist, MD;  Location: [facility] OR;  Service: ENT;  Laterality: Bilateral;    Allergies  Allergen Reactions  . Augmentin [Amoxicillin-Pot Clavulanate] Anaphylaxis and Other (See Comments)    Hallucinations.  . Penicillins Anaphylaxis and Nausea And Vomiting    Hallucinations.  . Clavulanic Acid   . Gabapentin     Hallucinate   . Moxifloxacin Hives  . Novolog [Insulin Aspart] Rash    Social History   Socioeconomic History  . Marital status: Soil scientist    Spouse name: Not on file  . Number of children: Not on file  . Years of education: Not on file  . Highest education level: Not on file  Occupational History  . Not on file  Tobacco Use  . Smoking status: Former Smoker    Types: Cigarettes    Quit date: 12/25/2008    Years since quitting: 11.7  . Smokeless tobacco: Never Used  Vaping Use  . Vaping Use: Never used  Substance and Sexual Activity  . Alcohol use: Yes    Comment: rare  . Drug use: No  . Sexual activity: Yes    Partners: Male    Comment: 1st intercourse- 82, partners- (44 female) (81 female),  married- 71 yrs   Other Topics Concern  . Not on file  Social History Narrative  . Not on file   Social Determinants of Health   Financial Resource Strain: Not on file  Food Insecurity: Not on file  Transportation Needs: Not on file  Physical Activity: Not on file  Stress: Not on file  Social Connections: Not on file  Intimate Partner Violence: Not on  file    Family History  Problem Relation Age of Onset  . Hypertension Mother   . Hypertension Sister   . Breast cancer Sister 52  . Diabetes Sister   . Breast cancer Cousin        several cousins  . Diabetes Cousin       Review of Systems Shortness of breath with activity Acid heartburn Difficulty swallowing Sore throat Anxiety and depression    Objective:   Physical Exam  Gen. Pleasant, well-nourished, in no distress, anxious affect ENT - no pallor,icterus, no post nasal drip Neck: No JVD, no thyromegaly, no carotid bruits Lungs: no use of accessory muscles, no dullness to percussion, decreased breath sound bilateral without rales or rhonchi  Cardiovascular: Rhythm regular, heart sounds  normal, no murmurs or gallops, no peripheral edema Abdomen: soft and non-tender, no hepatosplenomegaly, BS normal. Musculoskeletal: No deformities, no cyanosis or clubbing Neuro:  alert, non focal       Assessment & Plan:

## 2020-09-28 NOTE — Progress Notes (Signed)
Called and went over xray results per Dr Alva with patient. All questions answered and patient expressed full understanding. Nothing further needed at this time.

## 2020-10-03 ENCOUNTER — Telehealth: Payer: Self-pay | Admitting: Pulmonary Disease

## 2020-10-03 DIAGNOSIS — J9611 Chronic respiratory failure with hypoxia: Secondary | ICD-10-CM

## 2020-10-03 DIAGNOSIS — J449 Chronic obstructive pulmonary disease, unspecified: Secondary | ICD-10-CM | POA: Diagnosis not present

## 2020-10-03 NOTE — Telephone Encounter (Signed)
LMTC x 1 for Amy Cobb note from 09/23/20 was already sent to [facility] per Dr Bari Mantis note

## 2020-10-04 NOTE — Telephone Encounter (Signed)
Lmtcb for [employer]

## 2020-10-06 ENCOUNTER — Encounter: Payer: Self-pay | Admitting: Pulmonary Disease

## 2020-10-06 DIAGNOSIS — R0683 Snoring: Secondary | ICD-10-CM | POA: Diagnosis not present

## 2020-10-06 DIAGNOSIS — G4730 Sleep apnea, unspecified: Secondary | ICD-10-CM | POA: Diagnosis not present

## 2020-10-12 ENCOUNTER — Ambulatory Visit (INDEPENDENT_AMBULATORY_CARE_PROVIDER_SITE_OTHER): Payer: Medicare HMO

## 2020-10-12 ENCOUNTER — Ambulatory Visit (INDEPENDENT_AMBULATORY_CARE_PROVIDER_SITE_OTHER): Payer: Medicare HMO | Admitting: Podiatrist

## 2020-10-12 ENCOUNTER — Other Ambulatory Visit: Payer: Self-pay

## 2020-10-12 ENCOUNTER — Encounter: Payer: Self-pay | Admitting: Podiatrist

## 2020-10-12 ENCOUNTER — Other Ambulatory Visit: Payer: Self-pay | Admitting: Podiatrist

## 2020-10-12 DIAGNOSIS — M79671 Pain in right foot: Secondary | ICD-10-CM

## 2020-10-12 DIAGNOSIS — M7741 Metatarsalgia, right foot: Secondary | ICD-10-CM

## 2020-10-12 DIAGNOSIS — M2142 Flat foot [pes planus] (acquired), left foot: Secondary | ICD-10-CM

## 2020-10-12 DIAGNOSIS — M2141 Flat foot [pes planus] (acquired), right foot: Secondary | ICD-10-CM | POA: Diagnosis not present

## 2020-10-12 DIAGNOSIS — M79672 Pain in left foot: Secondary | ICD-10-CM

## 2020-10-12 DIAGNOSIS — E0840 Diabetes mellitus due to underlying condition with diabetic neuropathy, unspecified: Secondary | ICD-10-CM | POA: Diagnosis not present

## 2020-10-12 NOTE — Progress Notes (Signed)
Chief Complaint  Patient presents with  . Pain    Rt 2nd-5th sub met pain and across toes  x 3 mo; 9/10 sharp constant pain - no injury -w/ swelling -worse with walking tx: none   . diabetic shoes    Pt interested in DM shoes -FBS; 121 a1C: pt does not know PCP: [facility] x 2 wks     HPI: Patient is 60 y.o. female who presents today for pain on the bottom of the right foot as she points to the submet 2 region. She denies any injury to the area and relates pain with ambulation. She has tried no treatment.  She is also wanting to get some more diabetic shoes.  Her last pair were too big and she was unable to wear them.     Allergies  Allergen Reactions  . Augmentin [Amoxicillin-Pot Clavulanate] Anaphylaxis and Other (See Comments)    Hallucinations.  . Penicillins Anaphylaxis and Nausea And Vomiting    Hallucinations.  . Clavulanic Acid   . Gabapentin     Hallucinate   . Moxifloxacin Hives  . Novolog [Insulin Aspart] Rash    Review of systems is reviewed and negative.   Physical Exam  GENERAL APPEARANCE: Alert, conversant. Appropriately groomed. No acute distress.   VASCULAR: Pedal pulses palpable DP and PT bilateral.  Capillary refill time is immediate to all digits,  Proximal to distal cooling it warm to warm.  Digital perfusion adequate.   NEUROLOGIC: sensation is intact epicritically and protectively to 5.07 monofilament at 3/5 sites bilateral.  Light touch is intact bilateral, vibratory sensation absent bilateral, achilles tendon reflex is intact bilateral.   MUSCULOSKELETAL: acceptable muscle strength, tone and stability bilateral.  Pes planus foot type is noted bilateral.    Pain on palpation submetatarsal 2 is noted at the metatarsal head as well as the second metatarsal phalangeal joint itself.  Negative Lachman's test noted.  First MPJ motion decreased bilaterally.  DERMATOLOGIC: skin is warm, supple, and dry.  No open lesions noted.  No rash, no pre ulcerative lesions.  Digital nails are asymptomatic.    Crease and fat pad plantar aspect bilateral forefoot is noted.   X-ray evaluation 3 views of the right foot are obtained.  No sign of fracture or dislocation is noted.  Elongated second metatarsal in comparison with the first and third is seen.  No other acute osseous abnormalities are noted.  Assessment:    ICD-10-CM   1. Metatarsalgia of right foot  M77.41   2. Pes planus of both feet  M21.41    M21.42   3. Diabetes mellitus due to underlying condition with diabetic neuropathy, unspecified whether long term insulin use (HCC)  E08.40     Plan: -Discussed x-ray and exam findings with the patient.  Recommended a felt pad to offload the area of pain submetatarsal to of the right foot. -Recommended diabetic shoes with accommodative inserts and we will get the paperwork going to get these ordered for her. -An appoint will be scheduled for her to see Dr. Paulla Dolly for follow-up of the second tarsal pain and possible injection if the pain has not subsided with the use of the padding. If any concerns arise in the meantime she will call.

## 2020-10-12 NOTE — Patient Instructions (Signed)
We will file the paperwork for the diabetic shoes- when we call to schedule, request [place].  I've made you an appointment to see Dr. Paulla Dolly in 3 weeks-  You may cancel if the pain improves with the use of the padding.

## 2020-10-13 ENCOUNTER — Other Ambulatory Visit: Payer: Self-pay | Admitting: Podiatrist

## 2020-10-13 DIAGNOSIS — M7741 Metatarsalgia, right foot: Secondary | ICD-10-CM

## 2020-10-13 DIAGNOSIS — E113313 Type 2 diabetes mellitus with moderate nonproliferative diabetic retinopathy with macular edema, bilateral: Secondary | ICD-10-CM | POA: Diagnosis not present

## 2020-10-13 NOTE — Telephone Encounter (Signed)
ONO/RA shows minimal desaturation for 7 minutes less than 88%.  Okay to discontinue oxygen

## 2020-10-13 NOTE — Telephone Encounter (Signed)
Called and spoke with pt letting her know the results of ONO and stated to her that we would place order to [employer] to have her O2 picked up.pt verbalized understanding. Order has been placed. Nothing further needed.

## 2020-10-13 NOTE — Addendum Note (Signed)
Addended by: Lorretta Harp on: 10/13/2020 10:20 AM   Modules accepted: Orders

## 2020-10-17 DIAGNOSIS — I10 Essential (primary) hypertension: Secondary | ICD-10-CM | POA: Diagnosis not present

## 2020-10-17 DIAGNOSIS — K219 Gastro-esophageal reflux disease without esophagitis: Secondary | ICD-10-CM | POA: Diagnosis not present

## 2020-10-17 DIAGNOSIS — E1143 Type 2 diabetes mellitus with diabetic autonomic (poly)neuropathy: Secondary | ICD-10-CM | POA: Diagnosis not present

## 2020-10-31 ENCOUNTER — Other Ambulatory Visit: Payer: Self-pay

## 2020-10-31 ENCOUNTER — Ambulatory Visit (INDEPENDENT_AMBULATORY_CARE_PROVIDER_SITE_OTHER): Payer: Medicare HMO | Admitting: Podiatrist

## 2020-10-31 DIAGNOSIS — M2142 Flat foot [pes planus] (acquired), left foot: Secondary | ICD-10-CM

## 2020-10-31 DIAGNOSIS — M2141 Flat foot [pes planus] (acquired), right foot: Secondary | ICD-10-CM

## 2020-10-31 DIAGNOSIS — M7741 Metatarsalgia, right foot: Secondary | ICD-10-CM

## 2020-10-31 DIAGNOSIS — E0840 Diabetes mellitus due to underlying condition with diabetic neuropathy, unspecified: Secondary | ICD-10-CM

## 2020-10-31 DIAGNOSIS — J449 Chronic obstructive pulmonary disease, unspecified: Secondary | ICD-10-CM | POA: Diagnosis not present

## 2020-10-31 NOTE — Progress Notes (Signed)
Patient presented for foam casting for 3 pair custom diabetic shoe inserts. Patient is measured with a [place] device to be a size 7 medium  Diabetic shoes are chosen from the [employer] catalog.  The shoes chosen are V 752.  The patient will be contacted when the shoes and inserts are ready to be picked up.

## 2020-11-01 DIAGNOSIS — R0902 Hypoxemia: Secondary | ICD-10-CM | POA: Diagnosis not present

## 2020-11-01 DIAGNOSIS — G4733 Obstructive sleep apnea (adult) (pediatric): Secondary | ICD-10-CM | POA: Diagnosis not present

## 2020-11-01 DIAGNOSIS — J449 Chronic obstructive pulmonary disease, unspecified: Secondary | ICD-10-CM | POA: Diagnosis not present

## 2020-11-01 DIAGNOSIS — U071 COVID-19: Secondary | ICD-10-CM | POA: Diagnosis not present

## 2020-11-02 ENCOUNTER — Other Ambulatory Visit: Payer: Self-pay

## 2020-11-02 ENCOUNTER — Ambulatory Visit (INDEPENDENT_AMBULATORY_CARE_PROVIDER_SITE_OTHER): Payer: Medicare HMO | Admitting: Podiatry

## 2020-11-02 ENCOUNTER — Encounter: Payer: Self-pay | Admitting: Podiatry

## 2020-11-02 DIAGNOSIS — M779 Enthesopathy, unspecified: Secondary | ICD-10-CM

## 2020-11-02 DIAGNOSIS — M2141 Flat foot [pes planus] (acquired), right foot: Secondary | ICD-10-CM

## 2020-11-02 DIAGNOSIS — M2142 Flat foot [pes planus] (acquired), left foot: Secondary | ICD-10-CM

## 2020-11-02 DIAGNOSIS — M7741 Metatarsalgia, right foot: Secondary | ICD-10-CM

## 2020-11-02 MED ORDER — TRIAMCINOLONE ACETONIDE 10 MG/ML IJ SUSP
10.0000 mg | Freq: Once | INTRAMUSCULAR | Status: AC
  Administered 2020-11-02: 10 mg

## 2020-11-02 NOTE — Progress Notes (Signed)
Subjective:   Patient ID: Amy Cobb, female   DOB: 60 y.o.   MRN: [id number]   HPI Patient presents stating she has a lot of pain in these joints and its not responding to padding and shoe gear modifications so far.  Also concerned about her flatfoot deformity and what it means   ROS      Objective:  Physical Exam  Neurovascular status intact with inflammation pain around the second and third metatarsal phalangeal joint with mild digital deformity along with flatfoot deformity noted with discomfort with walking      Assessment:  Inflammatory capsulitis second and third metatarsal phalangeal joint right with pain along with flatfoot deformity which creates other discomforts     Plan:  H&P reviewed both conditions.  For the forefoot right I did do a sterile block 60 mg like Marcaine mixture and using sterile technique I aspirated the second and third MPJs getting out a small amount of clear fluid injected quarter cc dexamethasone Kenalog and then discussed flatfoot deformity and discussed diabetic shoes inserts and other modalities along with shoe gear modification.  Patient will be seen back as symptoms indicate

## 2020-11-07 ENCOUNTER — Other Ambulatory Visit: Payer: Self-pay

## 2020-11-07 ENCOUNTER — Other Ambulatory Visit (HOSPITAL_COMMUNITY)
Admission: RE | Admit: 2020-11-07 | Discharge: 2020-11-07 | Disposition: A | Discharge status: Home / Self Care | Payer: Medicare HMO | Source: Ambulatory Visit | Attending: Pulmonary Disease | Admitting: Pulmonary Disease

## 2020-11-07 DIAGNOSIS — Z20822 Contact with and (suspected) exposure to covid-19: Secondary | ICD-10-CM | POA: Insufficient documentation

## 2020-11-07 DIAGNOSIS — Z01812 Encounter for preprocedural laboratory examination: Secondary | ICD-10-CM | POA: Diagnosis not present

## 2020-11-08 ENCOUNTER — Ambulatory Visit: Payer: Medicare HMO | Admitting: Family Medicine

## 2020-11-08 ENCOUNTER — Ambulatory Visit (HOSPITAL_COMMUNITY)
Admission: RE | Admit: 2020-11-08 | Discharge: 2020-11-08 | Disposition: A | Discharge status: Home / Self Care | Payer: Medicare HMO | Source: Ambulatory Visit | Attending: Pulmonary Disease | Admitting: Pulmonary Disease

## 2020-11-08 DIAGNOSIS — J449 Chronic obstructive pulmonary disease, unspecified: Secondary | ICD-10-CM | POA: Diagnosis not present

## 2020-11-08 LAB — PULMONARY FUNCTION TEST
DL/VA % pred: 86 %
DL/VA: 3.59 ml/min/mmHg/L
DLCO unc % pred: 74 %
DLCO unc: 16.77 ml/min/mmHg
FEF 25-75 Post: 4.01 L/sec
FEF 25-75 Pre: 3.11 L/sec
FEF2575-%Change-Post: 29 %
FEF2575-%Pred-Post: 167 %
FEF2575-%Pred-Pre: 129 %
FEV1-%Change-Post: 1 %
FEV1-%Pred-Post: 106 %
FEV1-%Pred-Pre: 104 %
FEV1-Post: 2.59 L
FEV1-Pre: 2.55 L
FEV1FVC-%Change-Post: -2 %
FEV1FVC-%Pred-Pre: 106 %
FEV6-%Change-Post: 4 %
FEV6-%Pred-Post: 104 %
FEV6-%Pred-Pre: 99 %
FEV6-Post: 3.12 L
FEV6-Pre: 2.99 L
FEV6FVC-%Pred-Post: 103 %
FEV6FVC-%Pred-Pre: 103 %
FVC-%Change-Post: 4 %
FVC-%Pred-Post: 101 %
FVC-%Pred-Pre: 96 %
FVC-Post: 3.12 L
FVC-Pre: 2.99 L
Post FEV1/FVC ratio: 83 %
Post FEV6/FVC ratio: 100 %
Pre FEV1/FVC ratio: 85 %
Pre FEV6/FVC Ratio: 100 %
RV % pred: 87 %
RV: 1.83 L
TLC % pred: 93 %
TLC: 5.15 L

## 2020-11-08 LAB — SARS CORONAVIRUS 2 (TAT 6-24 HRS): SARS Coronavirus 2: NEGATIVE

## 2020-11-08 MED ORDER — ALBUTEROL SULFATE (2.5 MG/3ML) 0.083% IN NEBU
2.5000 mg | INHALATION_SOLUTION | Freq: Once | RESPIRATORY_TRACT | Status: AC
  Administered 2020-11-08: 2.5 mg via RESPIRATORY_TRACT

## 2020-11-09 ENCOUNTER — Ambulatory Visit: Payer: Medicare HMO | Admitting: Student

## 2020-11-09 ENCOUNTER — Encounter: Payer: Self-pay | Admitting: Student

## 2020-11-09 ENCOUNTER — Other Ambulatory Visit: Payer: Self-pay

## 2020-11-09 VITALS — BP 134/70 | HR 92 | Ht 67.0 in | Wt 178.8 lb

## 2020-11-09 DIAGNOSIS — I119 Hypertensive heart disease without heart failure: Secondary | ICD-10-CM | POA: Diagnosis not present

## 2020-11-09 DIAGNOSIS — I10 Essential (primary) hypertension: Secondary | ICD-10-CM

## 2020-11-09 DIAGNOSIS — R002 Palpitations: Secondary | ICD-10-CM

## 2020-11-09 NOTE — Patient Instructions (Signed)
Medication Instructions:  Your physician recommends that you continue on your current medications as directed. Please refer to the Current Medication list given to you today.  *If you need a refill on your cardiac medications before your next appointment, please call your pharmacy*   Lab Work: None If you have labs (blood work) drawn today and your tests are completely normal, you will receive your results only by: Marland Kitchen MyChart Message (if you have MyChart) OR . A paper copy in the mail If you have any lab test that is abnormal or we need to change your treatment, we will call you to review the results.   Testing/Procedures: Your physician has requested that you have an echocardiogram. Echocardiography is a painless test that uses sound waves to create images of your heart. It provides your doctor with information about the size and shape of your heart and how well your heart's chambers and valves are working. This procedure takes approximately one hour. There are no restrictions for this procedure.     Follow-Up: At [facility], you and your health needs are our priority.  As part of our continuing mission to provide you with exceptional heart care, we have created designated Provider Care Teams.  These Care Teams include your primary Cardiologist (physician) and Advanced Practice Providers (APPs -  Physician Assistants and Nurse Practitioners) who all work together to provide you with the care you need, when you need it.  We recommend signing up for the patient portal called "MyChart".  Sign up information is provided on this After Visit Summary.  MyChart is used to connect with patients for Virtual Visits (Telemedicine).  Patients are able to view lab/test results, encounter notes, upcoming appointments, etc.  Non-urgent messages can be sent to your provider as well.   To learn more about what you can do with MyChart, go to [email or web address].    Your next appointment:   6  month(s)  The format for your next appointment:   In Person  Provider:   You may see Rozann Lesches, MD or one of the following Advanced Practice Providers on your designated Care Team:    Bernerd Pho, PA-C     Other Instructions  Echocardiogram An echocardiogram is a test that uses sound waves (ultrasound) to produce images of the heart. Images from an echocardiogram can provide important information about:  Heart size and shape.  The size and thickness and movement of your heart's walls.  Heart muscle function and strength.  Heart valve function or if you have stenosis. Stenosis is when the heart valves are too narrow.  If blood is flowing backward through the heart valves (regurgitation).  A tumor or infectious growth around the heart valves.  Areas of heart muscle that are not working well because of poor blood flow or injury from a heart attack.  Aneurysm detection. An aneurysm is a weak or damaged part of an artery wall. The wall bulges out from the normal force of blood pumping through the body. Tell a health care provider about:  Any allergies you have.  All medicines you are taking, including vitamins, herbs, eye drops, creams, and over-the-counter medicines.  Any blood disorders you have.  Any surgeries you have had.  Any medical conditions you have.  Whether you are pregnant or may be pregnant. What are the risks? Generally, this is a safe test. However, problems may occur, including an allergic reaction to dye (contrast) that may be used during the test. What happens before the  test? No specific preparation is needed. You may eat and drink normally. What happens during the test?  You will take off your clothes from the waist up and put on a hospital gown.  Electrodes or electrocardiogram (ECG)patches may be placed on your chest. The electrodes or patches are then connected to a device that monitors your heart rate and rhythm.  You will lie  down on a table for an ultrasound exam. A gel will be applied to your chest to help sound waves pass through your skin.  A handheld device, called a transducer, will be pressed against your chest and moved over your heart. The transducer produces sound waves that travel to your heart and bounce back (or "echo" back) to the transducer. These sound waves will be captured in real-time and changed into images of your heart that can be viewed on a video monitor. The images will be recorded on a computer and reviewed by your health care provider.  You may be asked to change positions or hold your breath for a short time. This makes it easier to get different views or better views of your heart.  In some cases, you may receive contrast through an IV in one of your veins. This can improve the quality of the pictures from your heart. The procedure may vary among health care providers and hospitals.   What can I expect after the test? You may return to your normal, everyday life, including diet, activities, and medicines, unless your health care provider tells you not to do that. Follow these instructions at home:  It is up to you to get the results of your test. Ask your health care provider, or the department that is doing the test, when your results will be ready.  Keep all follow-up visits. This is important. Summary  An echocardiogram is a test that uses sound waves (ultrasound) to produce images of the heart.  Images from an echocardiogram can provide important information about the size and shape of your heart, heart muscle function, heart valve function, and other possible heart problems.  You do not need to do anything to prepare before this test. You may eat and drink normally.  After the echocardiogram is completed, you may return to your normal, everyday life, unless your health care provider tells you not to do that. This information is not intended to replace advice given to you by your  health care provider. Make sure you discuss any questions you have with your health care provider. Document Revised: 03/29/2020 Document Reviewed: 03/29/2020 [employer]vier Patient Education  2021 [employer].

## 2020-11-09 NOTE — Progress Notes (Signed)
Cardiology Office Note    Date:  11/09/2020   ID:  Amy Cobb, DOB 1960/10/01, MRN [id number]  PCP:  Neale Burly, MD  Cardiologist: Rozann Lesches, MD    Chief Complaint  Patient presents with  . Follow-up    6 month visit    History of Present Illness:    Amy Cobb is a 60 y.o. female with past medical history of HTN, hypertensive heart disease, Type 2 DM and COPD who presents to the office today for 53-month follow-up.  She most recently had a telehealth visit with Dr. Domenic Polite in 04/2020 and had recently been hospitalized for Covid pneumonia in 03/2020. At the time of her visit, she reported she was still using supplemental oxygen at home but denied any recent chest pain or palpitations. BP was well-controlled and she was continued on her current medication regimen of Chlorthalidone, Clonidine, Hydralazine, Losartan and Labetalol.  In talking the patient today, she reports her dyspnea has significantly improved since her last visit. She is no longer on supplemental oxygen and was able to stop steroid therapy approximately 2-3 weeks ago. She did undergo PFT's yesterday and is awaiting results from these. She denies any specific orthopnea, PND or lower extremity edema.   She does report frequent palpitations over the past several months and says these typically feel like a quivering sensation that lasts for a few seconds and spontaneously resolves. Denies any associated chest pressure or presyncope. Her symptoms have decreased in frequency and severity over the past several weeks and she denies any symptoms over the past few days. She does not consume caffeine regularly and only has alcohol on occasion.    Past Medical History:  Diagnosis Date  . Acid reflux   . Anxiety   . Asthma   . COPD (chronic obstructive pulmonary disease) ([place])   . Depression   . Essential hypertension   . OSA on CPAP   . Type 2 diabetes mellitus ([place])   . Vulvar cancer ([place]) 2003     Past Surgical History:  Procedure Laterality Date  . ABDOMINAL HYSTERECTOMY    . BREAST CYST EXCISION Right   . CERVICAL FUSION    . CO2 LASER APPLICATION  0938   CO2 LASER VAPORIZATION OF THE VULVAR LESION  . NASAL SEPTOPLASTY W/ TURBINOPLASTY N/A 02/17/2020   Procedure: BILATERAL NASAL SEPTOPLASTY;  Surgeon: Leta Baptist, MD;  Location: [facility];  Service: ENT;  Laterality: N/A;  . SHOULDER SURGERY    . TURBINATE REDUCTION Bilateral 02/17/2020   Procedure: BILATERAL TURBINATE REDUCTION;  Surgeon: Leta Baptist, MD;  Location: [facility];  Service: ENT;  Laterality: Bilateral;    Current Medications: Outpatient Medications Prior to Visit  Medication Sig Dispense Refill  . Aflibercept (EYLEA) 2 MG/0.05ML SOLN 1 each by Intravitreal route See admin instructions. Every 5 weeks    . albuterol (PROVENTIL HFA;VENTOLIN HFA) 108 (90 BASE) MCG/ACT inhaler Inhale 2 puffs into the lungs every 4 (four) hours as needed for wheezing or shortness of breath.    Marland Kitchen albuterol (PROVENTIL) (2.5 MG/3ML) 0.083% nebulizer solution Take 2.5 mg by nebulization every 8 (eight) hours as needed for wheezing or shortness of breath.     . Alcohol Swabs (B-D SINGLE USE SWABS REGULAR) PADS     . brimonidine (ALPHAGAN) 0.2 % ophthalmic solution Place 1 drop into the right eye in the morning and at bedtime.    . budesonide-formoterol (SYMBICORT) 160-4.5 MCG/ACT inhaler Inhale 2 puffs into the lungs 2 (two)  times daily.    . chlorthalidone (HYGROTON) 25 MG tablet Take 25 mg by mouth daily.    . cloNIDine (CATAPRES) 0.2 MG tablet Take 1 tablet (0.2 mg total) by mouth 3 (three) times daily.    . cyanocobalamin (,VITAMIN B-12,) 1000 MCG/ML injection Inject 1,000 mcg as directed every 30 (thirty) days.    . dorzolamide (TRUSOPT) 2 % ophthalmic solution Place 1 drop into the right eye in the morning and at bedtime.    . DROPLET PEN NEEDLES 32G X 4 MM MISC     . famotidine (PEPCID) 20 MG tablet Take 20 mg by mouth at bedtime.    .  fluticasone (FLONASE) 50 MCG/ACT nasal spray Place 1 spray into both nostrils daily.    Marland Kitchen glimepiride (AMARYL) 4 MG tablet     . hydrALAZINE (APRESOLINE) 50 MG tablet Take 1 tablet (50 mg total) by mouth 3 (three) times daily. 270 tablet 3  . insulin glargine (LANTUS) 100 UNIT/ML injection Inject 50 Units into the skin 2 (two) times daily.    . insulin lispro (HUMALOG) 100 UNIT/ML KwikPen Inject 25 Units into the skin 3 (three) times daily.    Marland Kitchen labetalol (NORMODYNE) 200 MG tablet TAKE 2 TABLETS (400 MG TOTAL) BY MOUTH 2 (TWO) TIMES DAILY. DOSE INCREASE 360 tablet 3  . losartan (COZAAR) 100 MG tablet Take 1 tablet (100 mg total) by mouth daily. 90 tablet 3  . minoxidil (LONITEN) 2.5 MG tablet Take 2.5 mg by mouth daily.    Marland Kitchen tiotropium (SPIRIVA) 18 MCG inhalation capsule Place 18 mcg into inhaler and inhale daily.    Marland Kitchen MODERNA COVID-19 VACCINE 100 MCG/0.5ML injection     . DULoxetine (CYMBALTA) 30 MG capsule Take 30 mg by mouth daily.    . meclizine (ANTIVERT) 25 MG tablet Take 25 mg by mouth 3 (three) times daily as needed for dizziness.     No facility-administered medications prior to visit.     Allergies:   Augmentin [amoxicillin-pot clavulanate], Penicillins, Clavulanic acid, Gabapentin, Moxifloxacin, and Novolog [insulin aspart]   Social History   Socioeconomic History  . Marital status: Soil scientist    Spouse name: Not on file  . Number of children: Not on file  . Years of education: Not on file  . Highest education level: Not on file  Occupational History  . Not on file  Tobacco Use  . Smoking status: Former Smoker    Types: Cigarettes    Quit date: 12/25/2008    Years since quitting: 11.8  . Smokeless tobacco: Never Used  Vaping Use  . Vaping Use: Never used  Substance and Sexual Activity  . Alcohol use: Yes    Comment: rare  . Drug use: No  . Sexual activity: Yes    Partners: Male    Comment: 1st intercourse- 89, partners- (73 female) (28 female),  married- 102 yrs    Other Topics Concern  . Not on file  Social History Narrative  . Not on file   Social Determinants of Health   Financial Resource Strain: Not on file  Food Insecurity: Not on file  Transportation Needs: Not on file  Physical Activity: Not on file  Stress: Not on file  Social Connections: Not on file     Family History:  The patient's family history includes Breast cancer in her cousin; Breast cancer (age of onset: 65) in her sister; Diabetes in her cousin and sister; Hypertension in her mother and sister.   Review of Systems:  Please see the history of present illness.     General:  No chills, fever, night sweats or weight changes.  Cardiovascular:  No chest pain, dyspnea on exertion, edema, orthopnea,  paroxysmal nocturnal dyspnea. Positive for palpitations.  Dermatological: No rash, lesions/masses Respiratory: No cough, dyspnea Urologic: No hematuria, dysuria Abdominal:   No nausea, vomiting, diarrhea, bright red blood per rectum, melena, or hematemesis Neurologic:  No visual changes, wkns, changes in mental status. All other systems reviewed and are otherwise negative except as noted above.   Physical Exam:    VS:  BP 134/70   Pulse 92   Ht 5\' 7"  (1.702 m)   Wt 178 lb 12.8 oz (81.1 kg)   SpO2 98%   BMI 28.00 kg/m    General: Well developed, well nourished,female appearing in no acute distress. Head: Normocephalic, atraumatic. Neck: No carotid bruits. JVD not elevated.  Lungs: Respirations regular and unlabored, without wheezes or rales.  Heart: Regular rate and rhythm. No S3 or S4.  No murmur, no rubs, or gallops appreciated. Abdomen: Appears non-distended. No obvious abdominal masses. Msk:  Strength and tone appear normal for age. No obvious joint deformities or effusions. Extremities: No clubbing or cyanosis. No lower extremity edema.  Distal pedal pulses are 2+ bilaterally. Neuro: Alert and oriented X 3. Moves all extremities spontaneously. No focal deficits  noted. Psych:  Responds to questions appropriately with a normal affect. Skin: No rashes or lesions noted  Wt Readings from Last 3 Encounters:  11/09/20 178 lb 12.8 oz (81.1 kg)  09/23/20 179 lb 9.6 oz (81.5 kg)  05/10/20 168 lb (76.2 kg)     Studies/Labs Reviewed:   EKG:  EKG is not ordered today.    Recent Labs: 02/12/2020: BUN 15; Creatinine, Ser 0.78; Hemoglobin 12.4; Platelets 349; Potassium 3.8; Sodium 141   Lipid Panel No results found for: CHOL, TRIG, HDL, CHOLHDL, VLDL, LDLCALC, LDLDIRECT  Additional studies/ records that were reviewed today include:   NST: 01/2014 IMPRESSION:  1. Normal Lexiscan Cardiolite stress test.   2. No evidence of myocardial ischemia or scar.   3. Exaggerated hypertensive response noted.   4. Normal left ventricular systolic function and regional wall  motion, calculated LVEF 74%.   5. No chest pain was reported.   Echocardiogram: 10/2018 IMPRESSIONS    1. The left ventricle has normal systolic function with an ejection  fraction of 60-65%. The cavity size was normal. There is mildly increased  left ventricular wall thickness. Left ventricular diastolic Doppler  parameters are consistent with impaired  relaxation.  2. The right ventricle has normal systolic function. The cavity was  normal. There is no increase in right ventricular wall thickness.  3. The mitral valve is normal in structure. No evidence of mitral valve  stenosis.  4. The tricuspid valve is normal in structure.  5. The aortic valve is tricuspid.  6. The aortic root is normal in size and structure.   Assessment:    1. Essential hypertension   2. Hypertensive heart disease without heart failure   3. Palpitations      Plan:   In order of problems listed above:  1. HTN - BP is well-controlled at 134/70 during today's visit. Continue current medication regimen with Chlorthalidone 25 mg daily, Clonidine 0.2 mg 3 times daily, Hydralazine 50 mg 3  times daily, Labetalol 400 mg twice daily, Losartan 100 mg daily and Minoxidil 2.5 mg daily. Will request most recent labs from PCP to reassess renal function and  electrolytes.  2. Hypertensive Heart Disease - She denies any recent orthopnea or PND and her dyspnea has significantly improved since being diagnosed with Covid pneumonia last year. She appears euvolemic by examination today. She is concerned about the status of her heart pumping function given her prior Covid diagnosis along with dyspnea and palpitations. Therefore, will plan to obtain a follow-up echocardiogram for reassessment.  3. Palpitations - By her description, these have been very fleeting and only lasting for seconds at a time and have significantly improved over the past few weeks. Symptoms seem most consistent with PAC's/PVC's by description. In NSR by examination today. Given improvement in her symptoms, I recommended continuing to follow symptoms now that she is off steroid therapy and supplemental oxygen. Will plan to obtain an echocardiogram as outlined above. If her palpitations persist or EF is reduced, would recommend a 2-week Zio patch to assess for any significant arrhythmias.  - Remains on Labetalol 400mg  BID.    Medication Adjustments/Labs and Tests Ordered: Current medicines are reviewed at length with the patient today.  Concerns regarding medicines are outlined above.  Medication changes, Labs and Tests ordered today are listed in the Patient Instructions below. Patient Instructions   Medication Instructions:  Your physician recommends that you continue on your current medications as directed. Please refer to the Current Medication list given to you today.  *If you need a refill on your cardiac medications before your next appointment, please call your pharmacy*   Lab Work: None If you have labs (blood work) drawn today and your tests are completely normal, you will receive your results only by: Marland Kitchen MyChart  Message (if you have MyChart) OR . A paper copy in the mail If you have any lab test that is abnormal or we need to change your treatment, we will call you to review the results.   Testing/Procedures: Your physician has requested that you have an echocardiogram. Echocardiography is a painless test that uses sound waves to create images of your heart. It provides your doctor with information about the size and shape of your heart and how well your heart's chambers and valves are working. This procedure takes approximately one hour. There are no restrictions for this procedure.     Follow-Up: At [facility], you and your health needs are our priority.  As part of our continuing mission to provide you with exceptional heart care, we have created designated Provider Care Teams.  These Care Teams include your primary Cardiologist (physician) and Advanced Practice Providers (APPs -  Physician Assistants and Nurse Practitioners) who all work together to provide you with the care you need, when you need it.  We recommend signing up for the patient portal called "MyChart".  Sign up information is provided on this After Visit Summary.  MyChart is used to connect with patients for Virtual Visits (Telemedicine).  Patients are able to view lab/test results, encounter notes, upcoming appointments, etc.  Non-urgent messages can be sent to your provider as well.   To learn more about what you can do with MyChart, go to [email or web address].    Your next appointment:   6 month(s)  The format for your next appointment:   In Person  Provider:   You may see Rozann Lesches, MD or one of the following Advanced Practice Providers on your designated Care Team:    Bernerd Pho, PA-C     Other Instructions  Echocardiogram An echocardiogram is a test that uses sound waves (ultrasound) to produce images  of the heart. Images from an echocardiogram can provide important information about:  Heart  size and shape.  The size and thickness and movement of your heart's walls.  Heart muscle function and strength.  Heart valve function or if you have stenosis. Stenosis is when the heart valves are too narrow.  If blood is flowing backward through the heart valves (regurgitation).  A tumor or infectious growth around the heart valves.  Areas of heart muscle that are not working well because of poor blood flow or injury from a heart attack.  Aneurysm detection. An aneurysm is a weak or damaged part of an artery wall. The wall bulges out from the normal force of blood pumping through the body. Tell a health care provider about:  Any allergies you have.  All medicines you are taking, including vitamins, herbs, eye drops, creams, and over-the-counter medicines.  Any blood disorders you have.  Any surgeries you have had.  Any medical conditions you have.  Whether you are pregnant or may be pregnant. What are the risks? Generally, this is a safe test. However, problems may occur, including an allergic reaction to dye (contrast) that may be used during the test. What happens before the test? No specific preparation is needed. You may eat and drink normally. What happens during the test?  You will take off your clothes from the waist up and put on a hospital gown.  Electrodes or electrocardiogram (ECG)patches may be placed on your chest. The electrodes or patches are then connected to a device that monitors your heart rate and rhythm.  You will lie down on a table for an ultrasound exam. A gel will be applied to your chest to help sound waves pass through your skin.  A handheld device, called a transducer, will be pressed against your chest and moved over your heart. The transducer produces sound waves that travel to your heart and bounce back (or "echo" back) to the transducer. These sound waves will be captured in real-time and changed into images of your heart that can be viewed on  a video monitor. The images will be recorded on a computer and reviewed by your health care provider.  You may be asked to change positions or hold your breath for a short time. This makes it easier to get different views or better views of your heart.  In some cases, you may receive contrast through an IV in one of your veins. This can improve the quality of the pictures from your heart. The procedure may vary among health care providers and hospitals.   What can I expect after the test? You may return to your normal, everyday life, including diet, activities, and medicines, unless your health care provider tells you not to do that. Follow these instructions at home:  It is up to you to get the results of your test. Ask your health care provider, or the department that is doing the test, when your results will be ready.  Keep all follow-up visits. This is important. Summary  An echocardiogram is a test that uses sound waves (ultrasound) to produce images of the heart.  Images from an echocardiogram can provide important information about the size and shape of your heart, heart muscle function, heart valve function, and other possible heart problems.  You do not need to do anything to prepare before this test. You may eat and drink normally.  After the echocardiogram is completed, you may return to your normal, everyday life, unless  your health care provider tells you not to do that. This information is not intended to replace advice given to you by your health care provider. Make sure you discuss any questions you have with your health care provider. Document Revised: 03/29/2020 Document Reviewed: 03/29/2020 Elsevier Patient Education  [address]      Signed, Erma Heritage, [place]  11/09/2020 5:09 PM    [facility] [address] [place], [place] [address] Phone: [phone] Fax: [phone]

## 2020-11-10 ENCOUNTER — Telehealth: Payer: Self-pay

## 2020-11-10 NOTE — Telephone Encounter (Signed)
-----   Message from Rigoberto Noel, MD sent at 11/09/2020  5:31 AM EDT ----- PFTs are within normal range She does not have COPD Continue on medication for now , will decide on FU OV whether she needs long term or not

## 2020-11-10 NOTE — Telephone Encounter (Signed)
That is correct -continue on Spiriva ,Symbicort

## 2020-11-10 NOTE — Telephone Encounter (Signed)
Called and updated patient on Dr Bari Mantis response to continue Spirva and Symbicort. Patient expressed full understanding. Nothing further needed at this time.

## 2020-11-10 NOTE — Telephone Encounter (Signed)
Called and went over PFT results per Dr Elsworth Soho with patient. Patient would like clarification as to what does Dr Elsworth Soho mean when stating continue on medication now. Does Dr Elsworth Soho want her to continue on the spiriva, symbicort and proventil inhalers which patient states she is doing ok on and NOT start the trelegy which she has had on hold per Dr Elsworth Soho. Dr Elsworth Soho please advise.  Scheduled office visit for 12/01/2020 at 11:15am at the [place] office. Patient agreeable to time, date and location.

## 2020-11-10 NOTE — Progress Notes (Signed)
Called and went over PFT results per Dr Elsworth Soho with patient. Please refer to telephone note from today 11/10/2020.

## 2020-11-16 DIAGNOSIS — E1143 Type 2 diabetes mellitus with diabetic autonomic (poly)neuropathy: Secondary | ICD-10-CM | POA: Diagnosis not present

## 2020-11-16 DIAGNOSIS — I10 Essential (primary) hypertension: Secondary | ICD-10-CM | POA: Diagnosis not present

## 2020-11-28 ENCOUNTER — Ambulatory Visit (HOSPITAL_COMMUNITY)
Admission: RE | Admit: 2020-11-28 | Discharge: 2020-11-28 | Disposition: A | Discharge status: Home / Self Care | Payer: Medicare HMO | Source: Ambulatory Visit | Attending: Student | Admitting: Student

## 2020-11-28 ENCOUNTER — Other Ambulatory Visit: Payer: Self-pay

## 2020-11-28 DIAGNOSIS — J449 Chronic obstructive pulmonary disease, unspecified: Secondary | ICD-10-CM | POA: Insufficient documentation

## 2020-11-28 DIAGNOSIS — E119 Type 2 diabetes mellitus without complications: Secondary | ICD-10-CM | POA: Diagnosis not present

## 2020-11-28 DIAGNOSIS — I10 Essential (primary) hypertension: Secondary | ICD-10-CM | POA: Insufficient documentation

## 2020-11-28 DIAGNOSIS — R002 Palpitations: Secondary | ICD-10-CM | POA: Diagnosis not present

## 2020-11-28 DIAGNOSIS — G4733 Obstructive sleep apnea (adult) (pediatric): Secondary | ICD-10-CM | POA: Diagnosis not present

## 2020-11-28 LAB — ECHOCARDIOGRAM COMPLETE
Area-P 1/2: 4.04 cm2
S' Lateral: 2.1 cm

## 2020-11-28 NOTE — Progress Notes (Signed)
*  PRELIMINARY RESULTS* Echocardiogram 2D Echocardiogram has been performed.  [name] 11/28/2020, 12:13 PM

## 2020-12-01 ENCOUNTER — Other Ambulatory Visit: Payer: Self-pay

## 2020-12-01 ENCOUNTER — Encounter: Payer: Self-pay | Admitting: Pulmonary Disease

## 2020-12-01 ENCOUNTER — Ambulatory Visit: Payer: Medicare HMO | Admitting: Pulmonary Disease

## 2020-12-01 DIAGNOSIS — J9611 Chronic respiratory failure with hypoxia: Secondary | ICD-10-CM | POA: Diagnosis not present

## 2020-12-01 DIAGNOSIS — G4733 Obstructive sleep apnea (adult) (pediatric): Secondary | ICD-10-CM

## 2020-12-01 DIAGNOSIS — J4531 Mild persistent asthma with (acute) exacerbation: Secondary | ICD-10-CM

## 2020-12-01 DIAGNOSIS — J449 Chronic obstructive pulmonary disease, unspecified: Secondary | ICD-10-CM | POA: Diagnosis not present

## 2020-12-01 NOTE — Assessment & Plan Note (Signed)
Resolved discontinue oxygen

## 2020-12-01 NOTE — Assessment & Plan Note (Signed)
We will again try to track down her sleep studies.  She seems to have a copy and will bring it over. She will get back on her CPAP machine and we can then review download for efficacy

## 2020-12-01 NOTE — Progress Notes (Signed)
   Subjective:    Patient ID: Amy Cobb, female    DOB: [birth year], 60 y.o.   MRN: [id number]  HPI  60 year old ex-smoker  For FU of 'COPD/asthma' and OSA She smoked about a pack per week starting at age 60 until age 60, less than 10 pack years.  She reports that her breathing problem started when she moved from [place] to [place] in 2005.  She was formally diagnosed with COPD in 2010 by her PCP  She reports needing prednisone tapers 5-6 times in the year and has been hospitalized at least once or twice every year.  She is maintained on a regimen of Symbicort and Spiriva and her PCP recently gave her a sample of Trelegy 200 to try.  She was hospitalized with Covid pneumonia 03/2020, discharged on 4 L oxygen.  OSA was diagnosed in 2019 and compliance has been sporadic based on report from [facility].  She has a delayed sleep phase syndrome with bedtime as late as 2 AM  PMH-insulin requiring diabetes, hypertension requiring 4 medications   initial office visit 09/2020 Breathing has been okay, no interim flares. Pollen is bothering her, takes Flonase, PCP as previously prescribed Xyzal Admits to decreased use of CPAP, no problems with mask or pressure Oxygen was returned after we reviewed ONO , and she is tolerating this well Compliant with Symbicort and Spiriva  Significant tests/ events reviewed PFTs 10/2020 no airway obstruction, nml FEV1, DLCO 74%  09/2020 ONO/RA shows minimal desaturation for 7 minutes less than 88%.>> dc O2  Review of Systems neg for any significant sore throat, dysphagia, itching, sneezing, nasal congestion or excess/ purulent secretions, fever, chills, sweats, unintended wt loss, pleuritic or exertional cp, hempoptysis, orthopnea pnd or change in chronic leg swelling. Also denies presyncope, palpitations, heartburn, abdominal pain, nausea, vomiting, diarrhea or change in bowel or urinary habits, dysuria,hematuria, rash, arthralgias, visual complaints,  headache, numbness weakness or ataxia.     Objective:   Physical Exam  Gen. Pleasant, obese, in no distress ENT - no lesions, no post nasal drip Neck: No JVD, no thyromegaly, no carotid bruits Lungs: no use of accessory muscles, no dullness to percussion, decreased without rales or rhonchi  Cardiovascular: Rhythm regular, heart sounds  normal, no murmurs or gallops, no peripheral edema Musculoskeletal: No deformities, no cyanosis or clubbing , no tremors       Assessment & Plan:

## 2020-12-01 NOTE — Patient Instructions (Addendum)
Stay on symbicort  We will treat you as asthma  Call us if you have a flare up

## 2020-12-01 NOTE — Assessment & Plan Note (Addendum)
We reviewed PFTs which shows no airway obstruction.  By definition, she does not have COPD.  Her frequent flareups over the past few years are more consistent with adult onset asthma.  We will maintain her on Symbicort.  At this time we can stop taking Spiriva. I have asked her to take Flonase and OTC Claritin during pollen season I have asked her to contact us during an exacerbation so we can confirm that this is indeed an asthma flare If she has repeated flares and will consider RAST testing

## 2020-12-06 ENCOUNTER — Other Ambulatory Visit: Payer: Medicare HMO

## 2020-12-08 DIAGNOSIS — E113313 Type 2 diabetes mellitus with moderate nonproliferative diabetic retinopathy with macular edema, bilateral: Secondary | ICD-10-CM | POA: Diagnosis not present

## 2020-12-14 DIAGNOSIS — G4733 Obstructive sleep apnea (adult) (pediatric): Secondary | ICD-10-CM | POA: Diagnosis not present

## 2020-12-14 DIAGNOSIS — J449 Chronic obstructive pulmonary disease, unspecified: Secondary | ICD-10-CM | POA: Diagnosis not present

## 2020-12-17 DIAGNOSIS — I10 Essential (primary) hypertension: Secondary | ICD-10-CM | POA: Diagnosis not present

## 2020-12-17 DIAGNOSIS — K219 Gastro-esophageal reflux disease without esophagitis: Secondary | ICD-10-CM | POA: Diagnosis not present

## 2020-12-17 DIAGNOSIS — E1143 Type 2 diabetes mellitus with diabetic autonomic (poly)neuropathy: Secondary | ICD-10-CM | POA: Diagnosis not present

## 2020-12-20 ENCOUNTER — Telehealth: Payer: Self-pay | Admitting: Pulmonary Disease

## 2020-12-20 NOTE — Telephone Encounter (Signed)
Reviewed sleep studies. 04/2018 NPSG -mild OSA, AHI 9/hour, RDI 30/hour 05/2018 CPAP titration >> 10 cm , medium mask

## 2020-12-22 DIAGNOSIS — Z6827 Body mass index (BMI) 27.0-27.9, adult: Secondary | ICD-10-CM | POA: Diagnosis not present

## 2020-12-22 DIAGNOSIS — Z0000 Encounter for general adult medical examination without abnormal findings: Secondary | ICD-10-CM | POA: Diagnosis not present

## 2020-12-22 DIAGNOSIS — S93492A Sprain of other ligament of left ankle, initial encounter: Secondary | ICD-10-CM | POA: Diagnosis not present

## 2020-12-22 DIAGNOSIS — I10 Essential (primary) hypertension: Secondary | ICD-10-CM | POA: Diagnosis not present

## 2020-12-22 DIAGNOSIS — J440 Chronic obstructive pulmonary disease with acute lower respiratory infection: Secondary | ICD-10-CM | POA: Diagnosis not present

## 2020-12-22 DIAGNOSIS — K219 Gastro-esophageal reflux disease without esophagitis: Secondary | ICD-10-CM | POA: Diagnosis not present

## 2020-12-22 DIAGNOSIS — E1143 Type 2 diabetes mellitus with diabetic autonomic (poly)neuropathy: Secondary | ICD-10-CM | POA: Diagnosis not present

## 2020-12-29 ENCOUNTER — Ambulatory Visit: Payer: Medicare HMO | Admitting: Podiatry

## 2020-12-29 ENCOUNTER — Encounter: Payer: Self-pay | Admitting: Podiatry

## 2020-12-29 ENCOUNTER — Ambulatory Visit (INDEPENDENT_AMBULATORY_CARE_PROVIDER_SITE_OTHER): Payer: Medicare HMO

## 2020-12-29 ENCOUNTER — Other Ambulatory Visit: Payer: Self-pay

## 2020-12-29 ENCOUNTER — Ambulatory Visit (INDEPENDENT_AMBULATORY_CARE_PROVIDER_SITE_OTHER): Payer: Medicare HMO | Admitting: *Deleted

## 2020-12-29 DIAGNOSIS — E0840 Diabetes mellitus due to underlying condition with diabetic neuropathy, unspecified: Secondary | ICD-10-CM

## 2020-12-29 DIAGNOSIS — S99912A Unspecified injury of left ankle, initial encounter: Secondary | ICD-10-CM | POA: Diagnosis not present

## 2020-12-29 DIAGNOSIS — M2141 Flat foot [pes planus] (acquired), right foot: Secondary | ICD-10-CM | POA: Diagnosis not present

## 2020-12-29 DIAGNOSIS — M2142 Flat foot [pes planus] (acquired), left foot: Secondary | ICD-10-CM | POA: Diagnosis not present

## 2020-12-29 DIAGNOSIS — E0842 Diabetes mellitus due to underlying condition with diabetic polyneuropathy: Secondary | ICD-10-CM | POA: Diagnosis not present

## 2020-12-29 DIAGNOSIS — E119 Type 2 diabetes mellitus without complications: Secondary | ICD-10-CM

## 2020-12-29 NOTE — Progress Notes (Signed)
Patient presents today to pick up diabetic shoes and insoles.  Patient was dispensed 1 pair of diabetic shoes and 3 pairs of foam casted diabetic insoles. Fit was satisfactory. Instructions for break-in and wear was reviewed and a copy was given to the patient.   Re-appointment for regularly scheduled diabetic foot care visits or if they should experience any trouble with the shoes or insoles.  

## 2020-12-29 NOTE — Progress Notes (Signed)
Subjective:   Patient ID: Amy Cobb, female   DOB: 60 y.o.   MRN: [id number]   HPI Patient states forefoot is doing very well but she stepped off a curb and twisted her left ankle and is concerned about fracture this occurred 1 week ago on 12/20/2020    ROS      Objective:  Physical Exam  Neurovascular status intact with swelling of the lateral medial side of the left ankle with slight splinting when I tested and no forefoot pain noted.  Quite sore with pressure      Assessment:  's range ankle left with possibility for fracture or diastases injury      Plan:  H&P x-rays reviewed of the ankle advised on ice therapy compression and supportive shoes.  Reappoint as symptoms indicate  X-rays were negative for signs of fracture or diastases injury left ankle

## 2020-12-31 DIAGNOSIS — J449 Chronic obstructive pulmonary disease, unspecified: Secondary | ICD-10-CM | POA: Diagnosis not present

## 2021-01-10 ENCOUNTER — Telehealth: Payer: Self-pay | Admitting: Pulmonary Disease

## 2021-01-10 NOTE — Telephone Encounter (Signed)
Okay to offer office visit at noon tomorrow

## 2021-01-10 NOTE — Telephone Encounter (Signed)
Called and spoke with Patient.  Patient stated she told front desk that she is only seen in [place], by Dr. Elsworth Soho.  Patient stated she felt like she may be beginning to have some type of asthma flare, and Dr. Elsworth Soho advised her to get scheduled with him when this happens. Patient does not want anything scheduled at [facility] office.  Advised Patient I would have her message sent to [facility] office to see if she can possibly be seen by Dr. Elsworth Soho this week.  Patient is aware at this time there are no openings on Dr. Bari Mantis schedule.   Message routed to [place] office

## 2021-01-10 NOTE — Telephone Encounter (Signed)
Called and spoke with patient who stated she started having shortness of breath, tightness when breathing in, non productive cough that started this past Saturday (01/07/21), per patient asthma flare up. Patient stated that Dr Elsworth Soho told her to schedule office visit with him when she has an asthma flare up. Patient states she has been using her neb and inhalers with no relief. Patient pharmacy is [facility] in [place]. Patient states she would rather have an appointment with Dr Elsworth Soho to assess.   Dr Elsworth Soho please advise if you would like me to do an add on your already full schedule for tomorrow or Thursday.

## 2021-01-10 NOTE — Telephone Encounter (Signed)
Called and scheduled patient for office visit for tomorrow (01/11/21) at noon per Dr Elsworth Soho at the [place] office. Patient in agreement with plan and states this is good for her and nothing further is needed at this time.

## 2021-01-11 ENCOUNTER — Encounter: Payer: Self-pay | Admitting: Pulmonary Disease

## 2021-01-11 ENCOUNTER — Ambulatory Visit: Payer: Medicare HMO | Admitting: Pulmonary Disease

## 2021-01-11 ENCOUNTER — Other Ambulatory Visit: Payer: Self-pay

## 2021-01-11 DIAGNOSIS — J4531 Mild persistent asthma with (acute) exacerbation: Secondary | ICD-10-CM

## 2021-01-11 DIAGNOSIS — E1165 Type 2 diabetes mellitus with hyperglycemia: Secondary | ICD-10-CM

## 2021-01-11 DIAGNOSIS — I10 Essential (primary) hypertension: Secondary | ICD-10-CM | POA: Diagnosis not present

## 2021-01-11 DIAGNOSIS — Z794 Long term (current) use of insulin: Secondary | ICD-10-CM

## 2021-01-11 MED ORDER — PREDNISONE 10 MG PO TABS: ORAL_TABLET | ORAL | 0 refills | Status: DC

## 2021-01-11 NOTE — Progress Notes (Signed)
   Subjective:    Patient ID: Amy Cobb, female    DOB: [birth year], 60 y.o.   MRN: [id number]  HPI  60 year old ex-smoker  For FU of asthma and OSA She smoked about a pack per week starting at age 37 until age 28, less than 10 pack years. She reports that her breathing problem started when she moved from [place] to [place] in 2005.  Although she carried a diagnosis of COPD, PFTs did not show any evidence of airway obstruction suggesting that this was more likely asthma. She reports prednisone tapers 5-6 times in the year and has been hospitalized at least once or twice every year.   PMH - hospitalized with Covid pneumonia 03/2020, discharged on 4 L oxygen.  She has a delayed sleep phase syndrome with bedtime as late as 2 AM  PMH-insulin requiring diabetes, hypertension requiring 6 medications  On her last visit 11/2020 we asked her to discontinue Spiriva This was an acute visit scheduled because she called stating that she had shortness of breath and chest tightness for the last 4 days, not relieved by inhalers.  I had suggested during her last visit that we would like to see her during an exacerbation hence she was worked in She denies wheezing, she has been using albuterol almost every 6 hours, compliant with Symbicort  Blood pressure is reasonably controlled, on 6 medications No pedal edema, orthopnea or PND  Significant tests/ events reviewed PFTs 10/2020 no airway obstruction, nml FEV1, DLCO 74%  09/2020 ONO/RAshows minimal desaturation for 7 minutes less than 88%.>> dc O2  04/2018 NPSG -mild OSA, AHI 9/hour, RDI 30/hour 05/2018 CPAP titration >> 10 cm , medium mask  Review of Systems neg for any significant sore throat, dysphagia, itching, sneezing, nasal congestion or excess/ purulent secretions, fever, chills, sweats, unintended wt loss, pleuritic or exertional cp, hempoptysis, orthopnea pnd or change in chronic leg swelling. Also denies presyncope,  palpitations, heartburn, abdominal pain, nausea, vomiting, diarrhea or change in bowel or urinary habits, dysuria,hematuria, rash, arthralgias, visual complaints, headache, numbness weakness or ataxia.     Objective:   Physical Exam  Gen. Pleasant, well-nourished, in no distress ENT - no thrush, no pallor/icterus,no post nasal drip Neck: No JVD, no thyromegaly, no carotid bruits Lungs: no use of accessory muscles, no dullness to percussion, decreased air movement without rales or rhonchi  Cardiovascular: Rhythm regular, heart sounds  normal, no murmurs or gallops, no peripheral edema Musculoskeletal: No deformities, no cyanosis or clubbing         Assessment & Plan:

## 2021-01-11 NOTE — Assessment & Plan Note (Signed)
Not fully convinced that this is an asthma exacerbation and we need to be vigilant for cardiac etiology.  Note, normal myocardial perfusion study in 2015, defer to cardiology whether we should revisit this issue.  She does not have typical chest pain but, in a female diabetic, wonder if dyspnea can be an anginal equivalent

## 2021-01-11 NOTE — Assessment & Plan Note (Addendum)
We will treat her as an asthma exacerbation, although I am not hearing any bronchospasm today, just decreased air movement.  No alternative explanation available for her symptoms but have to be vigilant  Prednisone 10 mg tabs Take 2 tabs twice daily with food x 4 days, then 3 tabs daily x 4 days, then 2 tabs daily x 4 days, then 1 tab daily x4 days then stop. #40  ( If better, OK to cut down in 2 days) -concern here is for steroid-induced hyperglycemia Stay on symbicort twice daily Use albuterol as needed  We will give her peak flow meter to keep a record and plan for RAST testing in the future

## 2021-01-11 NOTE — Patient Instructions (Signed)
Prednisone 10 mg tabs Take 2 tabs twice daily with food x 4 days, then 3 tabs daily x 4 days, then 2 tabs daily x 4 days, then 1 tab daily x4 days then stop. #40  ( If better, OK to cut down in 2 days) Stay on symbicort twice daily Use albuterol as needed

## 2021-01-11 NOTE — Assessment & Plan Note (Signed)
For steroid-induced hyperglycemia, she can increase lispro to 30 units 3 times daily instead of 25 for the duration that she is on steroids

## 2021-01-26 DIAGNOSIS — R6889 Other general symptoms and signs: Secondary | ICD-10-CM | POA: Diagnosis not present

## 2021-01-26 DIAGNOSIS — E113313 Type 2 diabetes mellitus with moderate nonproliferative diabetic retinopathy with macular edema, bilateral: Secondary | ICD-10-CM | POA: Diagnosis not present

## 2021-01-31 DIAGNOSIS — J449 Chronic obstructive pulmonary disease, unspecified: Secondary | ICD-10-CM | POA: Diagnosis not present

## 2021-02-16 DIAGNOSIS — E1143 Type 2 diabetes mellitus with diabetic autonomic (poly)neuropathy: Secondary | ICD-10-CM | POA: Diagnosis not present

## 2021-02-16 DIAGNOSIS — I10 Essential (primary) hypertension: Secondary | ICD-10-CM | POA: Diagnosis not present

## 2021-02-16 DIAGNOSIS — J440 Chronic obstructive pulmonary disease with acute lower respiratory infection: Secondary | ICD-10-CM | POA: Diagnosis not present

## 2021-02-16 DIAGNOSIS — K219 Gastro-esophageal reflux disease without esophagitis: Secondary | ICD-10-CM | POA: Diagnosis not present

## 2021-02-20 ENCOUNTER — Other Ambulatory Visit: Payer: Self-pay | Admitting: Cardiology

## 2021-03-02 ENCOUNTER — Telehealth: Payer: Self-pay | Admitting: Pulmonary Disease

## 2021-03-02 DIAGNOSIS — J449 Chronic obstructive pulmonary disease, unspecified: Secondary | ICD-10-CM | POA: Diagnosis not present

## 2021-03-02 NOTE — Telephone Encounter (Signed)
Called and spoke with patient regarding stress test with cardiologist. Advised patient that her chart was sent to her cardiologist on 5/25. She states she will reach out to her cardiologist regarding scheduling.  Nothing further needed at this time.

## 2021-03-06 ENCOUNTER — Telehealth: Payer: Self-pay | Admitting: Student

## 2021-03-06 DIAGNOSIS — R0600 Dyspnea, unspecified: Secondary | ICD-10-CM

## 2021-03-06 DIAGNOSIS — R0609 Other forms of dyspnea: Secondary | ICD-10-CM

## 2021-03-06 NOTE — Telephone Encounter (Signed)
Patient called to check status of Stress Test. She states Dr. Jacklynn Lewis sent referral to [place]. Request in system however [place] or [facility] will need to put in order. Please check with [place] and let the patient know. She can be reached at [phone].

## 2021-03-06 NOTE — Telephone Encounter (Signed)
See Dr.Alva note from 5/22. He asks cardiology to fu. Need to ask Dr.McDowell if he wants testing prior to her f/u visit in September with him.

## 2021-03-07 NOTE — Telephone Encounter (Signed)
Lexiscan ordered. Pt has f/u with B. Strader, PA-C scheduled 05/17/2021.

## 2021-03-10 ENCOUNTER — Telehealth: Payer: Self-pay | Admitting: Cardiology

## 2021-03-10 NOTE — Telephone Encounter (Signed)
I let Amy Cobb know that she will be offered either peanut butter or cheese crackers at visit. She is good with that.

## 2021-03-10 NOTE — Telephone Encounter (Signed)
New message     Patient has questions about her medication for the lexiscan  and can she bring something with her as a snack, because she is diabetic and cant go that long without eating

## 2021-03-15 DIAGNOSIS — J449 Chronic obstructive pulmonary disease, unspecified: Secondary | ICD-10-CM | POA: Diagnosis not present

## 2021-03-15 DIAGNOSIS — G4733 Obstructive sleep apnea (adult) (pediatric): Secondary | ICD-10-CM | POA: Diagnosis not present

## 2021-03-17 DIAGNOSIS — K219 Gastro-esophageal reflux disease without esophagitis: Secondary | ICD-10-CM | POA: Diagnosis not present

## 2021-03-17 DIAGNOSIS — J440 Chronic obstructive pulmonary disease with acute lower respiratory infection: Secondary | ICD-10-CM | POA: Diagnosis not present

## 2021-03-17 DIAGNOSIS — E1143 Type 2 diabetes mellitus with diabetic autonomic (poly)neuropathy: Secondary | ICD-10-CM | POA: Diagnosis not present

## 2021-03-17 DIAGNOSIS — I10 Essential (primary) hypertension: Secondary | ICD-10-CM | POA: Diagnosis not present

## 2021-03-19 DIAGNOSIS — K219 Gastro-esophageal reflux disease without esophagitis: Secondary | ICD-10-CM | POA: Diagnosis not present

## 2021-03-19 DIAGNOSIS — I10 Essential (primary) hypertension: Secondary | ICD-10-CM | POA: Diagnosis not present

## 2021-03-19 DIAGNOSIS — E1143 Type 2 diabetes mellitus with diabetic autonomic (poly)neuropathy: Secondary | ICD-10-CM | POA: Diagnosis not present

## 2021-03-20 ENCOUNTER — Telehealth: Payer: Self-pay | Admitting: Pulmonary Disease

## 2021-03-20 MED ORDER — BUDESONIDE-FORMOTEROL FUMARATE 160-4.5 MCG/ACT IN AERO: 2.0000 | INHALATION_SPRAY | Freq: Two times a day (BID) | RESPIRATORY_TRACT | 6 refills | Status: DC

## 2021-03-20 NOTE — Telephone Encounter (Signed)
Called and spoke with patient who states she needs refill of her Symbicort sent to the [facility]. Refill has been sent. Nothing further needed at this time.

## 2021-03-23 ENCOUNTER — Encounter (HOSPITAL_COMMUNITY): Payer: Medicare HMO

## 2021-03-23 DIAGNOSIS — J4540 Moderate persistent asthma, uncomplicated: Secondary | ICD-10-CM | POA: Diagnosis not present

## 2021-03-23 DIAGNOSIS — I10 Essential (primary) hypertension: Secondary | ICD-10-CM | POA: Diagnosis not present

## 2021-03-23 DIAGNOSIS — Z6828 Body mass index (BMI) 28.0-28.9, adult: Secondary | ICD-10-CM | POA: Diagnosis not present

## 2021-03-23 DIAGNOSIS — J440 Chronic obstructive pulmonary disease with acute lower respiratory infection: Secondary | ICD-10-CM | POA: Diagnosis not present

## 2021-03-23 DIAGNOSIS — S93492A Sprain of other ligament of left ankle, initial encounter: Secondary | ICD-10-CM | POA: Diagnosis not present

## 2021-03-23 DIAGNOSIS — K219 Gastro-esophageal reflux disease without esophagitis: Secondary | ICD-10-CM | POA: Diagnosis not present

## 2021-03-23 DIAGNOSIS — E1143 Type 2 diabetes mellitus with diabetic autonomic (poly)neuropathy: Secondary | ICD-10-CM | POA: Diagnosis not present

## 2021-03-23 DIAGNOSIS — Z0000 Encounter for general adult medical examination without abnormal findings: Secondary | ICD-10-CM | POA: Diagnosis not present

## 2021-03-27 ENCOUNTER — Telehealth: Payer: Self-pay | Admitting: Pulmonary Disease

## 2021-03-29 NOTE — Telephone Encounter (Signed)
I had received info from [place] at [facility] on 7/20 that this was a retail item and pt could pick it up at their store.  She had said she would make pt aware.  I called pt just now and she said no one had told her but that was ok & she would go pick one up.  Nothing further needed.

## 2021-04-06 DIAGNOSIS — R6889 Other general symptoms and signs: Secondary | ICD-10-CM | POA: Diagnosis not present

## 2021-04-07 ENCOUNTER — Other Ambulatory Visit: Payer: Self-pay

## 2021-04-07 ENCOUNTER — Encounter: Payer: Self-pay | Admitting: Pulmonary Disease

## 2021-04-07 ENCOUNTER — Ambulatory Visit: Payer: Medicare HMO | Admitting: Pulmonary Disease

## 2021-04-07 DIAGNOSIS — J4531 Mild persistent asthma with (acute) exacerbation: Secondary | ICD-10-CM

## 2021-04-07 DIAGNOSIS — G4733 Obstructive sleep apnea (adult) (pediatric): Secondary | ICD-10-CM | POA: Diagnosis not present

## 2021-04-07 MED ORDER — PREDNISONE 10 MG PO TABS: ORAL_TABLET | ORAL | 0 refills | Status: AC

## 2021-04-07 MED ORDER — ALBUTEROL SULFATE HFA 108 (90 BASE) MCG/ACT IN AERS: 2.0000 | INHALATION_SPRAY | RESPIRATORY_TRACT | 2 refills | Status: AC | PRN

## 2021-04-07 NOTE — Assessment & Plan Note (Signed)
Again with, I am seeing her during a flare and she has no wheezing at all. Atypical features of her asthma include no bronchospasm and no clear triggers.  We have to remain vigilant for other etiologies including cardiac, she is scheduled for stress test on Monday. I will give her a short course of prednisone until she gets her Symbicort on Monday. Will also provide her refills on albuterol. If stress test is negative, will proceed with CT angiogram chest to rule out pulmonary embolism, low clinical probability

## 2021-04-07 NOTE — Assessment & Plan Note (Signed)
Weight loss encouraged, compliance with goal of at least 4-6 hrs every night is the expectation. Advised against medications with sedative side effects Cautioned against driving when sleepy - understanding that sleepiness will vary on a day to day basis  

## 2021-04-07 NOTE — Addendum Note (Signed)
Addended by: Elby Beck R on: 04/07/2021 12:10 PM   Modules accepted: Orders

## 2021-04-07 NOTE — Patient Instructions (Signed)
Prednisone 10 mg tabs  Take 2 tabs daily with food x 5ds, then 1 tab daily with food x 5ds then STOP Albuterol refill to health dept Proceed with heart stress test

## 2021-04-07 NOTE — Progress Notes (Signed)
   Subjective:    Patient ID: Amy Cobb, female    DOB: 1961-01-12, 60 y.o.   MRN: [id number]  HPI  60 year old ex-smoker  For FU of asthma and OSA She smoked about a pack per week starting at age 20 until age 71, less than 10 pack years.  She reports that her breathing problem started when she moved from [place] to [place] in 2005.  PFTs did not show any evidence of airway obstruction suggesting that this was more likely asthma than COPD  She reports prednisone tapers 5-6 times in the year and has been hospitalized at least once or twice every year.    PMH - hospitalized with Covid pneumonia 03/2020, discharged on 4 L oxygen.    She has a delayed sleep phase syndrome with bedtime as late as 2 AM   PMH-insulin requiring diabetes, hypertension requiring 6 medications  Chief Complaint  Patient presents with   Follow-up    Patient states her breathing has been worse since last visit, more tired than normal. Has not been wearing Cpap.   Last OV 12/2020 >> given pred taper , I d/w cards re: cardiac eval Lexiscan scheduled for 8/22 She reports feeling better after prednisone course, about 2 weeks ago she started feeling bad, she states she had a cold, no other sick family members, denies wheezing and a cough, shortness of breath She also ran out of her Symbicort and has been using albuterol 3-4 times a day With her CPAP machine and admits that she has not been able to use this much.  She tolerates this better during the day but at night this causes her anxiety  Significant tests/ events reviewed PFTs 10/2020 no airway obstruction, nml FEV1, DLCO 74%   09/2020 ONO/RA shows minimal desaturation for 7 minutes less than 88%.>> dc O2   04/2018 NPSG -mild OSA, AHI 9/hour, RDI 30/hour 05/2018 CPAP titration >> 10 cm , medium mask  Review of Systems neg for any significant sore throat, dysphagia, itching, sneezing, nasal congestion or excess/ purulent secretions, fever, chills, sweats,  unintended wt loss, pleuritic or exertional cp, hempoptysis, orthopnea pnd or change in chronic leg swelling. Also denies presyncope, palpitations, heartburn, abdominal pain, nausea, vomiting, diarrhea or change in bowel or urinary habits, dysuria,hematuria, rash, arthralgias, visual complaints, headache, numbness weakness or ataxia.     Objective:   Physical Exam  Gen. Pleasant, well-nourished, in no distress ENT - no thrush, no pallor/icterus,no post nasal drip Neck: No JVD, no thyromegaly, no carotid bruits Lungs: no use of accessory muscles, no dullness to percussion, clear without rales or rhonchi  Cardiovascular: Rhythm regular, heart sounds  normal, no murmurs or gallops, no peripheral edema Musculoskeletal: No deformities, no cyanosis or clubbing         Assessment & Plan:

## 2021-04-10 ENCOUNTER — Encounter (HOSPITAL_COMMUNITY): Payer: Medicare HMO

## 2021-04-19 DIAGNOSIS — K219 Gastro-esophageal reflux disease without esophagitis: Secondary | ICD-10-CM | POA: Diagnosis not present

## 2021-04-19 DIAGNOSIS — E1143 Type 2 diabetes mellitus with diabetic autonomic (poly)neuropathy: Secondary | ICD-10-CM | POA: Diagnosis not present

## 2021-04-19 DIAGNOSIS — J440 Chronic obstructive pulmonary disease with acute lower respiratory infection: Secondary | ICD-10-CM | POA: Diagnosis not present

## 2021-04-19 DIAGNOSIS — I10 Essential (primary) hypertension: Secondary | ICD-10-CM | POA: Diagnosis not present

## 2021-04-20 DIAGNOSIS — R6889 Other general symptoms and signs: Secondary | ICD-10-CM | POA: Diagnosis not present

## 2021-04-20 DIAGNOSIS — E113313 Type 2 diabetes mellitus with moderate nonproliferative diabetic retinopathy with macular edema, bilateral: Secondary | ICD-10-CM | POA: Diagnosis not present

## 2021-04-25 ENCOUNTER — Encounter (HOSPITAL_COMMUNITY): Payer: Self-pay

## 2021-04-25 ENCOUNTER — Other Ambulatory Visit: Payer: Self-pay

## 2021-04-25 ENCOUNTER — Ambulatory Visit (HOSPITAL_COMMUNITY)
Admission: RE | Admit: 2021-04-25 | Discharge: 2021-04-25 | Disposition: A | Discharge status: Home / Self Care | Payer: Medicare HMO | Source: Ambulatory Visit | Attending: Cardiology | Admitting: Cardiology

## 2021-04-25 ENCOUNTER — Encounter (HOSPITAL_COMMUNITY)
Admission: RE | Admit: 2021-04-25 | Discharge: 2021-04-25 | Disposition: A | Discharge status: Home / Self Care | Payer: Medicare HMO | Source: Ambulatory Visit | Attending: Cardiology | Admitting: Cardiology

## 2021-04-25 DIAGNOSIS — R0609 Other forms of dyspnea: Secondary | ICD-10-CM

## 2021-04-25 DIAGNOSIS — R0600 Dyspnea, unspecified: Secondary | ICD-10-CM | POA: Insufficient documentation

## 2021-04-25 LAB — NM MYOCAR MULTI W/SPECT W/WALL MOTION / EF
LV dias vol: 50 mL (ref 46–106)
LV sys vol: 16 mL
Nuc Stress EF: 67 %
Peak HR: 108 {beats}/min
RATE: 0.3
Rest HR: 76 {beats}/min
Rest Nuclear Isotope Dose: 9.7 mCi
SDS: 3
SRS: 0
SSS: 3
ST Depression (mm): 0 mm
Stress Nuclear Isotope Dose: 27.7 mCi
TID: 0.84

## 2021-04-25 MED ORDER — REGADENOSON 0.4 MG/5ML IV SOLN
INTRAVENOUS | Status: AC
  Administered 2021-04-25: 0.4 mg via INTRAVENOUS
  Filled 2021-04-25: qty 5

## 2021-04-25 MED ORDER — SODIUM CHLORIDE FLUSH 0.9 % IV SOLN
INTRAVENOUS | Status: AC
  Administered 2021-04-25: 10 mL via INTRAVENOUS
  Filled 2021-04-25: qty 10

## 2021-04-25 MED ORDER — TECHNETIUM TC 99M TETROFOSMIN IV KIT
10.0000 | PACK | Freq: Once | INTRAVENOUS | Status: AC | PRN
  Administered 2021-04-25: 9.4 via INTRAVENOUS

## 2021-04-25 MED ORDER — TECHNETIUM TC 99M TETROFOSMIN IV KIT
30.0000 | PACK | Freq: Once | INTRAVENOUS | Status: AC | PRN
  Administered 2021-04-25: 27.7 via INTRAVENOUS

## 2021-04-29 ENCOUNTER — Other Ambulatory Visit: Payer: Self-pay | Admitting: Cardiology

## 2021-05-01 IMAGING — CR DG CHEST 2V
2 series · 2 of 2 positions shown · non-contrast
Comparison: 04/16/2020

CLINICAL DATA: COPD.  Short of breath.

EXAM:
CHEST - 2 VIEW

[w pa chest]
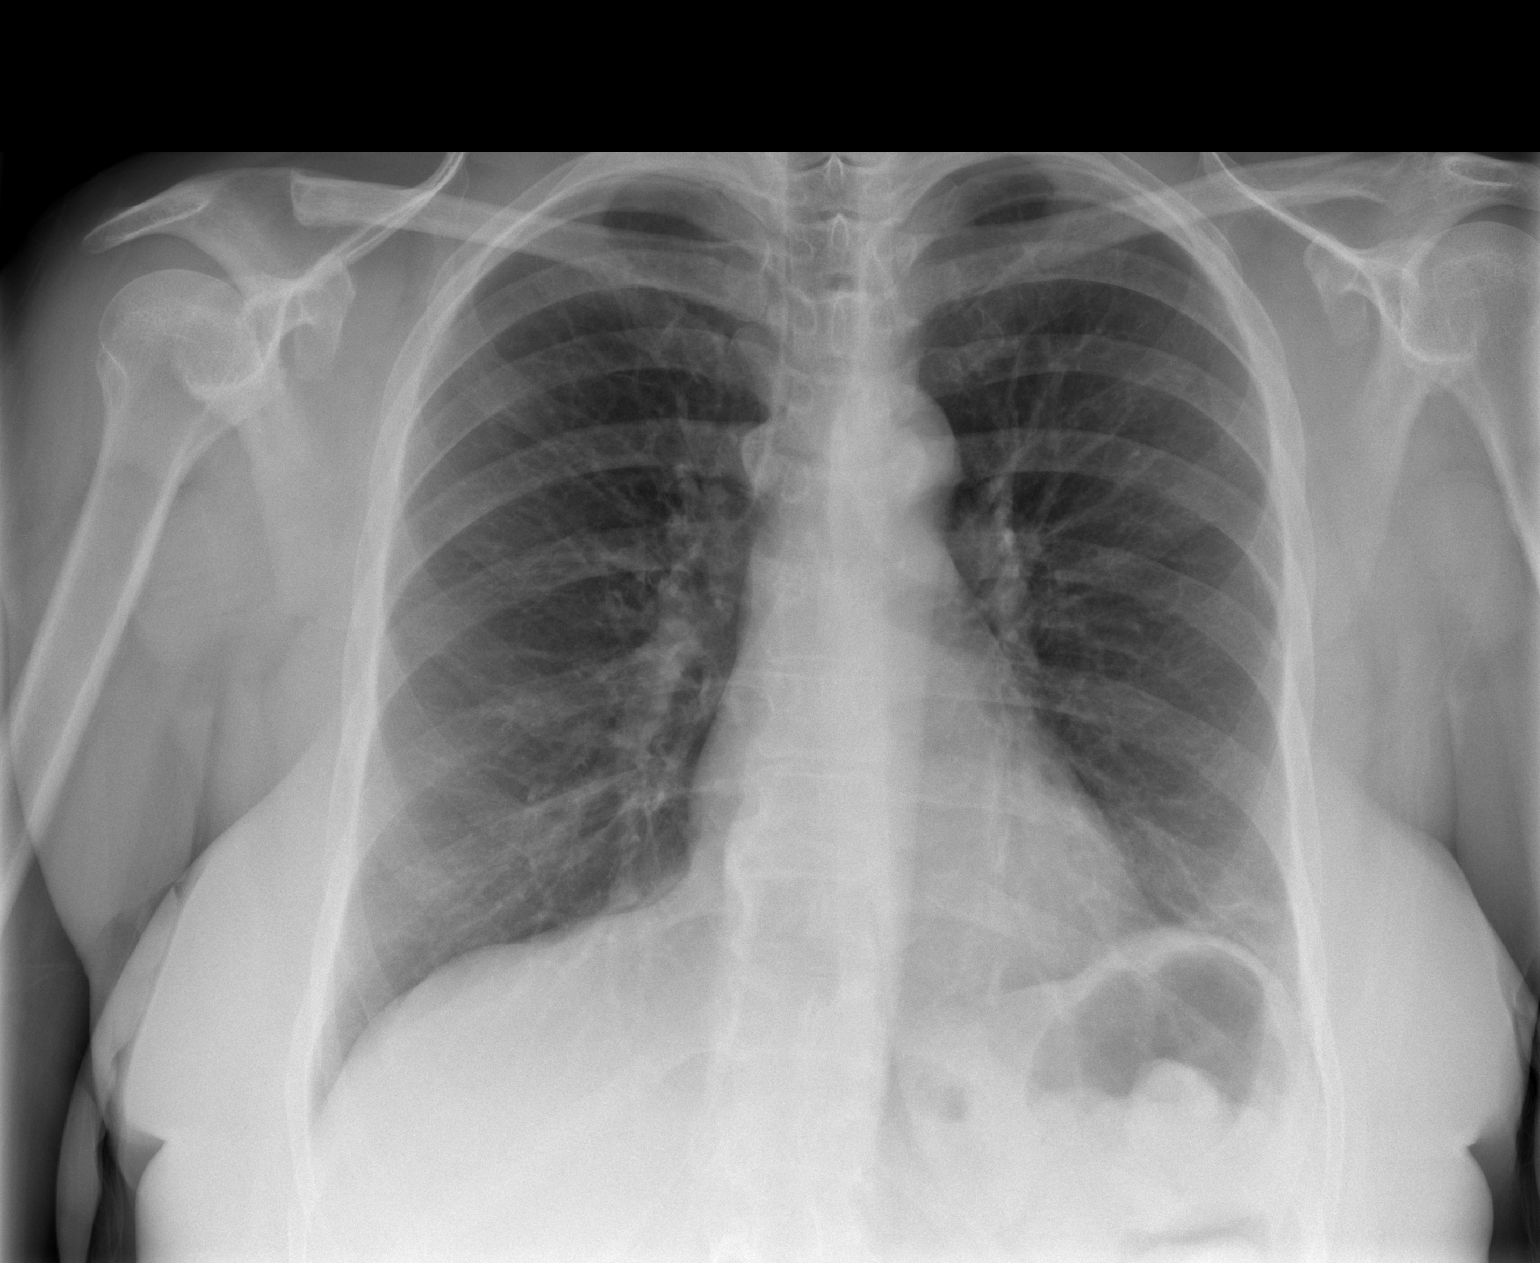

[w chest lat]
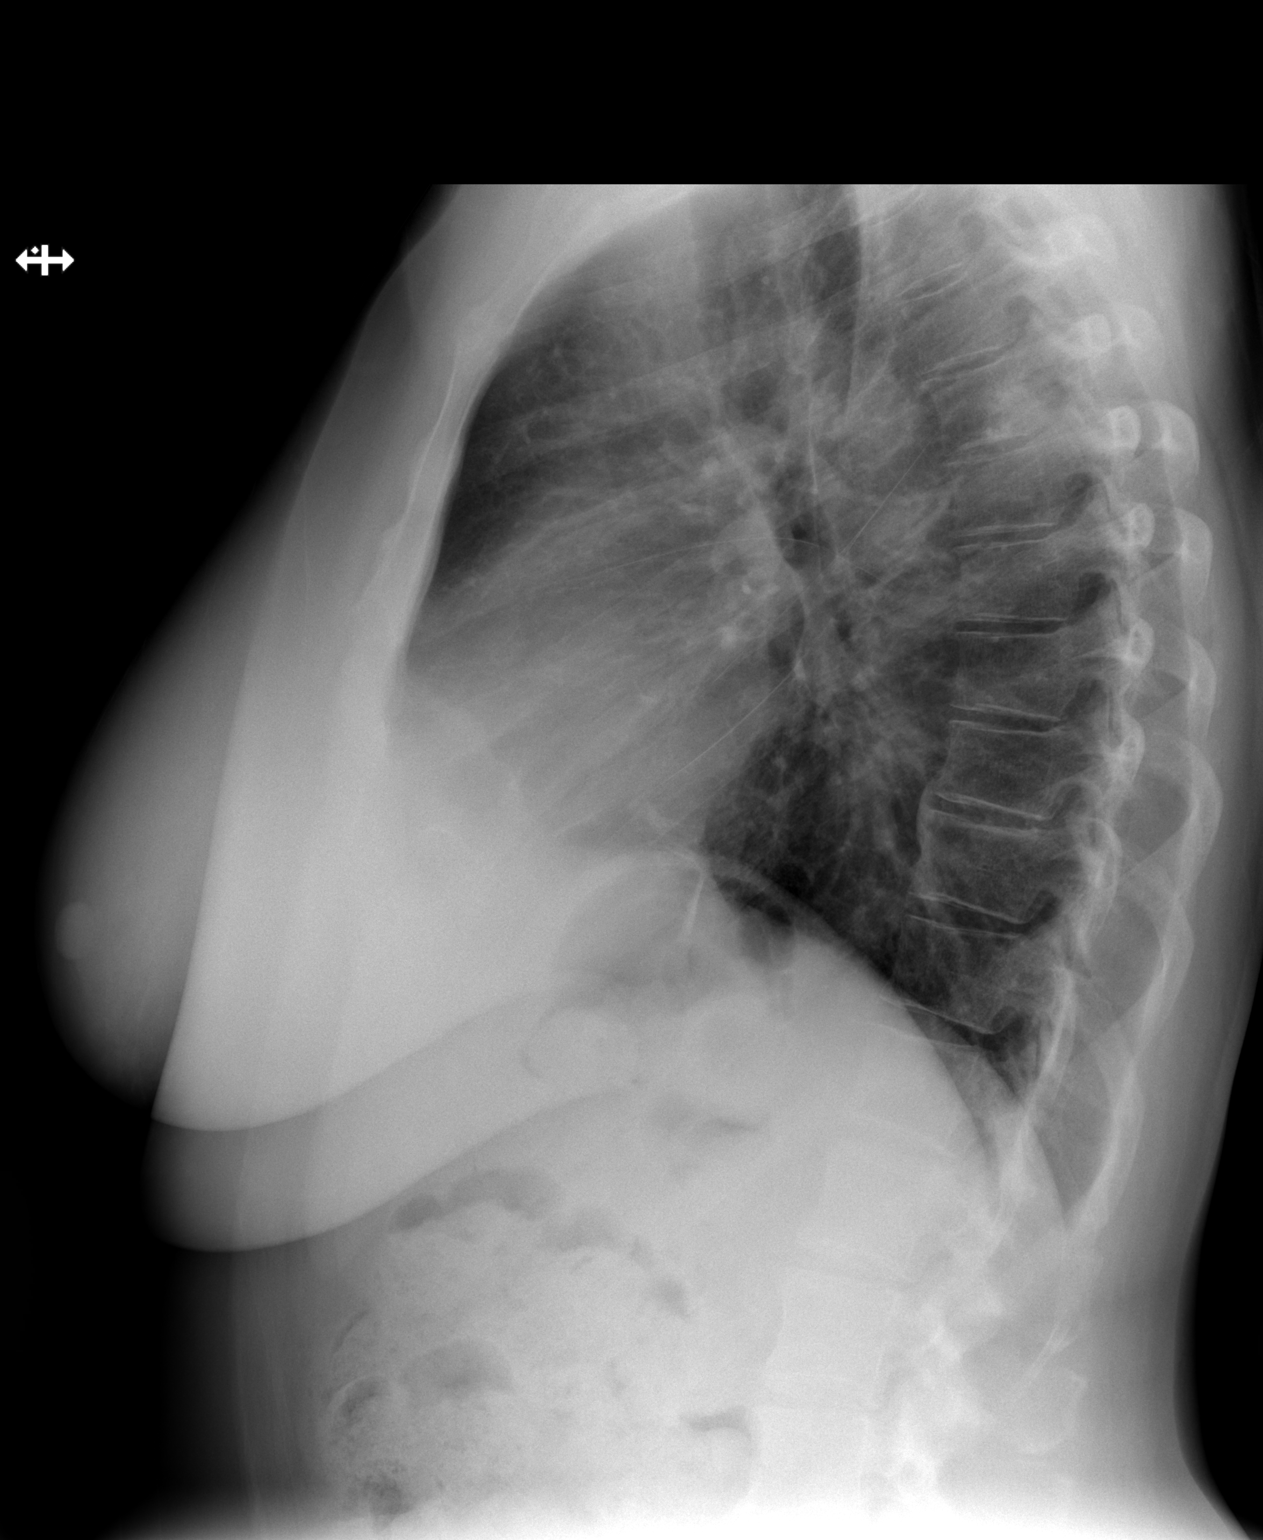

[2 of 2 positions shown; findings below may reference images not displayed]

FINDINGS: Cardiac and mediastinal contours normal. Vascularity normal.
Interval clearing of bilateral infiltrates. Mild lingular
atelectasis or scarring otherwise lungs are clear. No effusion.
IMPRESSION: No active cardiopulmonary disease.

## 2021-05-16 NOTE — Progress Notes (Deleted)
Cardiology Office Note  Date: 05/16/2021   ID: Amy Cobb, DOB 1960-12-11, MRN [id number]  PCP:  Neale Burly, MD  Cardiologist:  Rozann Lesches, MD Electrophysiologist:  None   No chief complaint on file.   History of Present Illness: Amy Cobb is a 60 y.o. female last seen in March by Ms. Strader PA-C.  Recent follow-up Lexiscan Myoview was negative for ischemia.  Past Medical History:  Diagnosis Date   Acid reflux    Anxiety    Asthma    COPD (chronic obstructive pulmonary disease) ([place])    Depression    Essential hypertension    OSA on CPAP    Type 2 diabetes mellitus ([place])    Vulvar cancer ([place]) 2003    Past Surgical History:  Procedure Laterality Date   ABDOMINAL HYSTERECTOMY     BREAST CYST EXCISION Right    CERVICAL FUSION     CO2 LASER APPLICATION  6812   CO2 LASER VAPORIZATION OF THE VULVAR LESION   NASAL SEPTOPLASTY W/ TURBINOPLASTY N/A 02/17/2020   Procedure: BILATERAL NASAL SEPTOPLASTY;  Surgeon: Leta Baptist, MD;  Location: [place];  Service: ENT;  Laterality: N/A;   SHOULDER SURGERY     TURBINATE REDUCTION Bilateral 02/17/2020   Procedure: BILATERAL TURBINATE REDUCTION;  Surgeon: Leta Baptist, MD;  Location: [place];  Service: ENT;  Laterality: Bilateral;    Current Outpatient Medications  Medication Sig Dispense Refill   Aflibercept (EYLEA) 2 MG/0.05ML SOLN 1 each by Intravitreal route See admin instructions. Every 5 weeks     albuterol (PROVENTIL) (2.5 MG/3ML) 0.083% nebulizer solution Take 2.5 mg by nebulization every 8 (eight) hours as needed for wheezing or shortness of breath.      albuterol (VENTOLIN HFA) 108 (90 Base) MCG/ACT inhaler Inhale 2 puffs into the lungs every 4 (four) hours as needed for wheezing or shortness of breath. 8 g 2   Alcohol Swabs (B-D SINGLE USE SWABS REGULAR) PADS      brimonidine (ALPHAGAN) 0.2 % ophthalmic solution Place 1 drop into the right eye in the morning and at bedtime.     budesonide-formoterol  (SYMBICORT) 160-4.5 MCG/ACT inhaler Inhale 2 puffs into the lungs 2 (two) times daily. 1 each 6   chlorthalidone (HYGROTON) 25 MG tablet Take 25 mg by mouth daily.     cloNIDine (CATAPRES) 0.2 MG tablet Take 1 tablet (0.2 mg total) by mouth 3 (three) times daily.     cyanocobalamin (,VITAMIN B-12,) 1000 MCG/ML injection Inject 1,000 mcg as directed every 30 (thirty) days.     dorzolamide (TRUSOPT) 2 % ophthalmic solution Place 1 drop into the right eye in the morning and at bedtime.     DROPLET PEN NEEDLES 32G X 4 MM MISC      famotidine (PEPCID) 20 MG tablet Take 20 mg by mouth at bedtime.     fluticasone (FLONASE) 50 MCG/ACT nasal spray Place 1 spray into both nostrils daily.     glimepiride (AMARYL) 4 MG tablet      hydrALAZINE (APRESOLINE) 50 MG tablet TAKE 1 TABLET THREE TIMES DAILY 270 tablet 3   insulin glargine (LANTUS) 100 UNIT/ML injection Inject 50 Units into the skin 2 (two) times daily.     insulin lispro (HUMALOG) 100 UNIT/ML KwikPen Inject 25 Units into the skin 3 (three) times daily.     labetalol (NORMODYNE) 200 MG tablet TAKE 2 TABLETS (400 MG TOTAL) BY MOUTH 2 (TWO) TIMES DAILY. DOSE INCREASE 360 tablet 3  losartan (COZAAR) 100 MG tablet Take 1 tablet (100 mg total) by mouth daily. 90 tablet 3   minoxidil (LONITEN) 2.5 MG tablet Take 2.5 mg by mouth daily.     tiotropium (SPIRIVA) 18 MCG inhalation capsule Place 18 mcg into inhaler and inhale daily.     No current facility-administered medications for this visit.   Allergies:  Augmentin [amoxicillin-pot clavulanate], Penicillins, Clavulanic acid, Gabapentin, Moxifloxacin, and Novolog [insulin aspart]   Social History: The patient  reports that she quit smoking about 12 years ago. Her smoking use included cigarettes. She smoked an average of .25 packs per day. She has never used smokeless tobacco. She reports current alcohol use. She reports that she does not use drugs.   Family History: The patient's family history includes  Breast cancer in her cousin; Breast cancer (age of onset: 89) in her sister; Diabetes in her cousin and sister; Hypertension in her mother and sister.   ROS:  Please see the history of present illness. Otherwise, complete review of systems is positive for {NONE DEFAULTED:18576}.  All other systems are reviewed and negative.   Physical Exam: VS:  There were no vitals taken for this visit., BMI There is no height or weight on file to calculate BMI.  Wt Readings from Last 3 Encounters:  04/07/21 179 lb (81.2 kg)  01/11/21 177 lb 3.2 oz (80.4 kg)  12/01/20 174 lb 6.4 oz (79.1 kg)    General: Patient appears comfortable at rest. HEENT: Conjunctiva and lids normal, oropharynx clear with moist mucosa. Neck: Supple, no elevated JVP or carotid bruits, no thyromegaly. Lungs: Clear to auscultation, nonlabored breathing at rest. Cardiac: Regular rate and rhythm, no S3 or significant systolic murmur, no pericardial rub. Abdomen: Soft, nontender, no hepatomegaly, bowel sounds present, no guarding or rebound. Extremities: No pitting edema, distal pulses 2+. Skin: Warm and dry. Musculoskeletal: No kyphosis. Neuropsychiatric: Alert and oriented x3, affect grossly appropriate.  ECG:  An ECG dated 02/12/2020 was personally reviewed today and demonstrated:  Sinus rhythm with leftward axis.  Recent Labwork:  February 2022: Cholesterol 172, triglycerides 83, HDL 31, LDL 106, hemoglobin A1c 8.3%, BUN 17, creatinine 0.87, potassium 3.7, AST 17, ALT 15  Other Studies Reviewed Today:  Lexiscan Myoview 04/25/2021:   Lexiscan stress is electrically negative for ischemia   Myoview shows normal perfusion  No ischemia   LVEf is normal at 67%   Low risk study  Assessment and Plan:   Medication Adjustments/Labs and Tests Ordered: Current medicines are reviewed at length with the patient today.  Concerns regarding medicines are outlined above.   Tests Ordered: No orders of the defined types were placed in  this encounter.   Medication Changes: No orders of the defined types were placed in this encounter.   Disposition:  Follow up {follow up:15908}  Signed, Satira Sark, MD, [facility] 05/16/2021 4:27 PM    [facility] at [facility] [address] [place], [place] [address] Phone: [phone]; Fax: [phone]

## 2021-05-17 ENCOUNTER — Ambulatory Visit: Payer: Medicare HMO | Admitting: Cardiology

## 2021-05-17 DIAGNOSIS — I10 Essential (primary) hypertension: Secondary | ICD-10-CM

## 2021-05-18 DIAGNOSIS — E1143 Type 2 diabetes mellitus with diabetic autonomic (poly)neuropathy: Secondary | ICD-10-CM | POA: Diagnosis not present

## 2021-05-18 DIAGNOSIS — I10 Essential (primary) hypertension: Secondary | ICD-10-CM | POA: Diagnosis not present

## 2021-05-19 DIAGNOSIS — I10 Essential (primary) hypertension: Secondary | ICD-10-CM | POA: Diagnosis not present

## 2021-05-19 DIAGNOSIS — J4540 Moderate persistent asthma, uncomplicated: Secondary | ICD-10-CM | POA: Diagnosis not present

## 2021-05-19 DIAGNOSIS — J440 Chronic obstructive pulmonary disease with acute lower respiratory infection: Secondary | ICD-10-CM | POA: Diagnosis not present

## 2021-05-19 DIAGNOSIS — E1143 Type 2 diabetes mellitus with diabetic autonomic (poly)neuropathy: Secondary | ICD-10-CM | POA: Diagnosis not present

## 2021-05-19 DIAGNOSIS — K219 Gastro-esophageal reflux disease without esophagitis: Secondary | ICD-10-CM | POA: Diagnosis not present

## 2021-05-22 ENCOUNTER — Other Ambulatory Visit: Payer: Self-pay

## 2021-05-22 ENCOUNTER — Ambulatory Visit: Payer: Medicare HMO | Admitting: Cardiology

## 2021-05-22 ENCOUNTER — Encounter: Payer: Self-pay | Admitting: Cardiology

## 2021-05-22 VITALS — BP 142/72 | HR 84 | Ht 67.0 in | Wt 179.0 lb

## 2021-05-22 DIAGNOSIS — R0609 Other forms of dyspnea: Secondary | ICD-10-CM

## 2021-05-22 DIAGNOSIS — I10 Essential (primary) hypertension: Secondary | ICD-10-CM

## 2021-05-22 NOTE — Progress Notes (Signed)
Cardiology Office Note  Date: 05/22/2021   ID: STEPHANE JUNKINS, DOB 1961-03-26, MRN [id number]  PCP:  Neale Burly, MD  Cardiologist:  Rozann Lesches, MD Electrophysiologist:  None   Chief Complaint  Patient presents with   Cardiac follow-up     History of Present Illness: Amy Cobb is a 60 y.o. female last last seen in March by Ms. Strader. PA-C.  She presents for a routine visit.  Reports no change in stamina or increasing shortness of breath with typical activities.  No exertional chest pain or syncope.  She underwent follow-up ischemic testing via [facility] in September which was low risk, also echocardiogram in April of this year showing normal LVEF at 60 to 65% with mild diastolic dysfunction.  She has a home blood pressure monitor with results sent to Dr. Sherrie Sport.  I reviewed her current regimen.  She reports compliance with therapy.   Past Medical History:  Diagnosis Date   Acid reflux    Anxiety    Asthma    COPD (chronic obstructive pulmonary disease) ([place])    Depression    Essential hypertension    OSA on CPAP    Type 2 diabetes mellitus ([place])    Vulvar cancer ([place]) 2003    Past Surgical History:  Procedure Laterality Date   ABDOMINAL HYSTERECTOMY     BREAST CYST EXCISION Right    CERVICAL FUSION     CO2 LASER APPLICATION  8295   CO2 LASER VAPORIZATION OF THE VULVAR LESION   NASAL SEPTOPLASTY W/ TURBINOPLASTY N/A 02/17/2020   Procedure: BILATERAL NASAL SEPTOPLASTY;  Surgeon: Leta Baptist, MD;  Location: [facility];  Service: ENT;  Laterality: N/A;   SHOULDER SURGERY     TURBINATE REDUCTION Bilateral 02/17/2020   Procedure: BILATERAL TURBINATE REDUCTION;  Surgeon: Leta Baptist, MD;  Location: [facility];  Service: ENT;  Laterality: Bilateral;    Current Outpatient Medications  Medication Sig Dispense Refill   Aflibercept (EYLEA) 2 MG/0.05ML SOLN 1 each by Intravitreal route See admin instructions. Every 5 weeks     albuterol (PROVENTIL) (2.5 MG/3ML)  0.083% nebulizer solution Take 2.5 mg by nebulization every 8 (eight) hours as needed for wheezing or shortness of breath.      albuterol (VENTOLIN HFA) 108 (90 Base) MCG/ACT inhaler Inhale 2 puffs into the lungs every 4 (four) hours as needed for wheezing or shortness of breath. 8 g 2   Alcohol Swabs (B-D SINGLE USE SWABS REGULAR) PADS      brimonidine (ALPHAGAN) 0.2 % ophthalmic solution Place 1 drop into the right eye in the morning and at bedtime.     budesonide-formoterol (SYMBICORT) 160-4.5 MCG/ACT inhaler Inhale 2 puffs into the lungs 2 (two) times daily. 1 each 6   chlorthalidone (HYGROTON) 25 MG tablet Take 25 mg by mouth daily.     cloNIDine (CATAPRES) 0.2 MG tablet Take 1 tablet (0.2 mg total) by mouth 3 (three) times daily.     cyanocobalamin (,VITAMIN B-12,) 1000 MCG/ML injection Inject 1,000 mcg as directed every 30 (thirty) days.     DROPLET PEN NEEDLES 32G X 4 MM MISC      famotidine (PEPCID) 20 MG tablet Take 20 mg by mouth at bedtime.     fluticasone (FLONASE) 50 MCG/ACT nasal spray Place 1 spray into both nostrils daily.     glimepiride (AMARYL) 4 MG tablet      hydrALAZINE (APRESOLINE) 50 MG tablet TAKE 1 TABLET THREE TIMES DAILY 270 tablet 3  insulin glargine (LANTUS) 100 UNIT/ML injection Inject 50 Units into the skin 2 (two) times daily.     labetalol (NORMODYNE) 200 MG tablet TAKE 2 TABLETS (400 MG TOTAL) BY MOUTH 2 (TWO) TIMES DAILY. DOSE INCREASE 360 tablet 3   losartan (COZAAR) 100 MG tablet Take 1 tablet (100 mg total) by mouth daily. 90 tablet 3   minoxidil (LONITEN) 2.5 MG tablet Take 2.5 mg by mouth daily.     dorzolamide (TRUSOPT) 2 % ophthalmic solution Place 1 drop into the right eye in the morning and at bedtime. (Patient not taking: Reported on 05/22/2021)     insulin lispro (HUMALOG) 100 UNIT/ML KwikPen Inject 25 Units into the skin 3 (three) times daily. (Patient not taking: Reported on 05/22/2021)     tiotropium (SPIRIVA) 18 MCG inhalation capsule Place 18 mcg  into inhaler and inhale daily. (Patient not taking: Reported on 05/22/2021)     No current facility-administered medications for this visit.   Allergies:  Augmentin [amoxicillin-pot clavulanate], Penicillins, Clavulanic acid, Gabapentin, Moxifloxacin, and Novolog [insulin aspart]   ROS: No orthopnea PND  Physical Exam: VS:  BP (!) 142/72   Pulse 84   Ht 5\' 7"  (1.702 m)   Wt 179 lb (81.2 kg)   SpO2 97%   BMI 28.04 kg/m , BMI Body mass index is 28.04 kg/m.  Wt Readings from Last 3 Encounters:  05/22/21 179 lb (81.2 kg)  04/07/21 179 lb (81.2 kg)  01/11/21 177 lb 3.2 oz (80.4 kg)    General: Patient appears comfortable at rest. HEENT: Conjunctiva and lids normal, wearing a mask. Neck: Supple, no elevated JVP or carotid bruits, no thyromegaly. Lungs: Clear to auscultation, nonlabored breathing at rest. Cardiac: Regular rate and rhythm, no S3 or significant systolic murmur, no pericardial rub. Extremities: No pitting edema.  ECG:  An ECG dated 02/12/2020 was personally reviewed today and demonstrated:  Sinus rhythm with leftward axis and decreased R wave progression, nonspecific ST changes.  Recent Labwork:  February 2022: Cholesterol 172, triglycerides 83, LDL 108, hemoglobin A1c 6.3%, BUN 17, creatinine 0.87, potassium 3.7, AST 17, ALT 15  Other Studies Reviewed Today:  Echocardiogram 11/28/2020:  1. Left ventricular ejection fraction, by estimation, is 60 to 65%. The  left ventricle has normal function. The left ventricle has no regional  wall motion abnormalities. There is mild left ventricular hypertrophy.  Left ventricular diastolic parameters  are consistent with Grade I diastolic dysfunction (impaired relaxation).   2. Right ventricular systolic function is normal. The right ventricular  size is normal.   3. Left atrial size was mildly dilated.   4. The mitral valve is normal in structure. No evidence of mitral valve  regurgitation. No evidence of mitral stenosis.    5. The aortic valve is normal in structure. Aortic valve regurgitation is  not visualized. No aortic stenosis is present.   6. The inferior vena cava is normal in size with greater than 50%  respiratory variability, suggesting right atrial pressure of 3 mmHg.   Lexiscan Myoview 04/25/2021:   Lexiscan stress is electrically negative for ischemia   Myoview shows normal perfusion  No ischemia   LVEf is normal at 67%   Low risk study  Assessment and Plan:  1.  Essential hypertension on multimodal therapy and with regular follow-up per PCP.  Blood pressure is mildly elevated today, no changes made for now.  Would continue on clonidine, losartan, labetalol, and hydralazine.  Continue to track blood pressure at home.  2.  History of COPD and chronic dyspnea on exertion, no change in symptoms.  Patient underwent general cardiac follow-up this year with echocardiogram in April showing LVEF 60 to 62% and mild diastolic dysfunction, also low risk Lexiscan Myoview without ischemia just recently.  Medication Adjustments/Labs and Tests Ordered: Current medicines are reviewed at length with the patient today.  Concerns regarding medicines are outlined above.   Tests Ordered: No orders of the defined types were placed in this encounter.   Medication Changes: No orders of the defined types were placed in this encounter.   Disposition:  Follow up  1 year.  Signed, Satira Sark, MD, [facility] 05/22/2021 2:30 PM    [facility] [place]. [address], [place], [place] [address] Phone: [phone]; Fax: [phone]

## 2021-05-22 NOTE — Patient Instructions (Signed)
Medication Instructions:   Your physician recommends that you continue on your current medications as directed. Please refer to the Current Medication list given to you today.  *If you need a refill on your cardiac medications before your next appointment, please call your pharmacy*   Lab Work: None today  If you have labs (blood work) drawn today and your tests are completely normal, you will receive your results only by: . MyChart Message (if you have MyChart) OR . A paper copy in the mail If you have any lab test that is abnormal or we need to change your treatment, we will call you to review the results.   Testing/Procedures: None today   Follow-Up: At [facility], you and your health needs are our priority.  As part of our continuing mission to provide you with exceptional heart care, we have created designated Provider Care Teams.  These Care Teams include your primary Cardiologist (physician) and Advanced Practice Providers (APPs -  Physician Assistants and Nurse Practitioners) who all work together to provide you with the care you need, when you need it.  We recommend signing up for the patient portal called "MyChart".  Sign up information is provided on this After Visit Summary.  MyChart is used to connect with patients for Virtual Visits (Telemedicine).  Patients are able to view lab/test results, encounter notes, upcoming appointments, etc.  Non-urgent messages can be sent to your provider as well.   To learn more about what you can do with MyChart, go to [email or web address].    Your next appointment:   12 month(s)  The format for your next appointment:   In Person  Provider:   Samuel McDowell, MD   Other Instructions None   

## 2021-06-13 DIAGNOSIS — G4733 Obstructive sleep apnea (adult) (pediatric): Secondary | ICD-10-CM | POA: Diagnosis not present

## 2021-06-13 DIAGNOSIS — J449 Chronic obstructive pulmonary disease, unspecified: Secondary | ICD-10-CM | POA: Diagnosis not present

## 2021-06-19 ENCOUNTER — Telehealth: Payer: Self-pay | Admitting: Cardiology

## 2021-06-19 ENCOUNTER — Other Ambulatory Visit: Payer: Self-pay | Admitting: Cardiology

## 2021-06-19 DIAGNOSIS — I10 Essential (primary) hypertension: Secondary | ICD-10-CM | POA: Diagnosis not present

## 2021-06-19 DIAGNOSIS — E1143 Type 2 diabetes mellitus with diabetic autonomic (poly)neuropathy: Secondary | ICD-10-CM | POA: Diagnosis not present

## 2021-06-19 MED ORDER — LOSARTAN POTASSIUM 100 MG PO TABS: 100.0000 mg | ORAL_TABLET | Freq: Every day | ORAL | 1 refills | Status: DC

## 2021-06-19 NOTE — Telephone Encounter (Signed)
  *  STAT* If patient is at the pharmacy, call can be transferred to refill team.   1. Which medications need to be refilled? (please list name of each medication and dose if known) losartan (COZAAR) 100 MG tablet  2. Which pharmacy/location (including street and city if local pharmacy) is medication to be sent to?[place], [place] [address]  3. Do they need a 30 day or 90 day supply? 30 days   Pt is out of meds and [facility] is having delay on pt's prescription, not going to receive it until 7-10 dayd, please send 30 days emergency refill to local pharmacy

## 2021-06-19 NOTE — Telephone Encounter (Signed)
Refilled per request.

## 2021-06-29 DIAGNOSIS — R6889 Other general symptoms and signs: Secondary | ICD-10-CM | POA: Diagnosis not present

## 2021-06-29 DIAGNOSIS — E113313 Type 2 diabetes mellitus with moderate nonproliferative diabetic retinopathy with macular edema, bilateral: Secondary | ICD-10-CM | POA: Diagnosis not present

## 2021-07-04 DIAGNOSIS — M5441 Lumbago with sciatica, right side: Secondary | ICD-10-CM | POA: Diagnosis not present

## 2021-07-04 DIAGNOSIS — Z6827 Body mass index (BMI) 27.0-27.9, adult: Secondary | ICD-10-CM | POA: Diagnosis not present

## 2021-07-17 ENCOUNTER — Other Ambulatory Visit: Payer: Self-pay | Admitting: Internal Medicine

## 2021-07-17 DIAGNOSIS — Z1231 Encounter for screening mammogram for malignant neoplasm of breast: Secondary | ICD-10-CM

## 2021-07-19 DIAGNOSIS — E1143 Type 2 diabetes mellitus with diabetic autonomic (poly)neuropathy: Secondary | ICD-10-CM | POA: Diagnosis not present

## 2021-07-19 DIAGNOSIS — K219 Gastro-esophageal reflux disease without esophagitis: Secondary | ICD-10-CM | POA: Diagnosis not present

## 2021-07-19 DIAGNOSIS — I10 Essential (primary) hypertension: Secondary | ICD-10-CM | POA: Diagnosis not present

## 2021-07-20 DIAGNOSIS — H40041 Steroid responder, right eye: Secondary | ICD-10-CM | POA: Diagnosis not present

## 2021-07-20 DIAGNOSIS — E113313 Type 2 diabetes mellitus with moderate nonproliferative diabetic retinopathy with macular edema, bilateral: Secondary | ICD-10-CM | POA: Diagnosis not present

## 2021-08-18 DIAGNOSIS — E1143 Type 2 diabetes mellitus with diabetic autonomic (poly)neuropathy: Secondary | ICD-10-CM | POA: Diagnosis not present

## 2021-08-18 DIAGNOSIS — K219 Gastro-esophageal reflux disease without esophagitis: Secondary | ICD-10-CM | POA: Diagnosis not present

## 2021-08-18 DIAGNOSIS — I10 Essential (primary) hypertension: Secondary | ICD-10-CM | POA: Diagnosis not present

## 2021-08-22 ENCOUNTER — Ambulatory Visit: Payer: Medicare HMO

## 2021-09-01 ENCOUNTER — Ambulatory Visit
Admission: RE | Admit: 2021-09-01 | Discharge: 2021-09-01 | Disposition: A | Discharge status: Home / Self Care | Payer: Medicare HMO | Source: Ambulatory Visit | Attending: Internal Medicine | Admitting: Internal Medicine

## 2021-09-01 ENCOUNTER — Ambulatory Visit: Payer: Medicare HMO

## 2021-09-01 ENCOUNTER — Other Ambulatory Visit: Payer: Self-pay

## 2021-09-01 DIAGNOSIS — Z1231 Encounter for screening mammogram for malignant neoplasm of breast: Secondary | ICD-10-CM

## 2021-09-04 ENCOUNTER — Other Ambulatory Visit: Payer: Self-pay

## 2021-09-04 ENCOUNTER — Ambulatory Visit: Payer: Medicare HMO | Admitting: Pulmonary Disease

## 2021-09-04 ENCOUNTER — Encounter: Payer: Self-pay | Admitting: Pulmonary Disease

## 2021-09-04 DIAGNOSIS — J4531 Mild persistent asthma with (acute) exacerbation: Secondary | ICD-10-CM

## 2021-09-04 DIAGNOSIS — G4733 Obstructive sleep apnea (adult) (pediatric): Secondary | ICD-10-CM | POA: Diagnosis not present

## 2021-09-04 NOTE — Assessment & Plan Note (Signed)
We discussed alternative options for OSA including oral appliance and inspire device. She has mild OSA by AHI criteria but seems to be moderate to severe by RDI criteria.  Would advise treatment especially in the light of her severe hypertension requiring 6 medications  At this time she results to get back on CPAP and give this another try.  She has had a 10 cm and I think that should be adequate.  We will review compliance during a follow-up in 6 months if she is unable to use then we can push other alternatives  Weight loss encouraged, compliance with goal of at least 4-6 hrs every night is the expectation. Advised against medications with sedative side effects Cautioned against driving when sleepy - understanding that sleepiness will vary on a day to day basis

## 2021-09-04 NOTE — Assessment & Plan Note (Signed)
Continue on Symbicort and use albuterol for rescue

## 2021-09-04 NOTE — Patient Instructions (Signed)
°  Continue on symbicort

## 2021-09-04 NOTE — Progress Notes (Signed)
° °  Subjective:    Patient ID: Amy Cobb, female    DOB: [birth year], 61 y.o.   MRN: [id number]  HPI  61 yo ex-smoker  For FU of asthma and OSA She smoked about a pack per week starting at age 70 until age 76, less than 10 pack years.  She reports that her breathing problem started when she moved from [place] to [place] in 2005.  PFTs did not show any evidence of airway obstruction suggesting that this was more likely asthma than COPD  She reports prednisone tapers 5-6 times in the year and has been hospitalized at least once or twice every year.    PMH - hospitalized with Covid pneumonia 03/2020, discharged on 4 L oxygen.    She has a delayed sleep phase syndrome with bedtime as late as 2 AM   PMH-insulin requiring diabetes, hypertension requiring 6 medications   69-month follow-up visit. She has done it well in the interim.  Her grandson recently had a chest cold and she got this from him but was able to do okay with just over-the-counter medications and did not need antibiotics or prednisone .  She is compliant with Symbicort. Cardiology consultation and low risk Myoview results were noted. She has not been using her CPAP machine, her girlfriend has noted loud snoring and she is now resolving to get back on this  We reviewed her prior sleep studies, weight is unchanged at 174 pounds  Significant tests/ events reviewed  04/2021 lexiscan myoview >> low risk study  PFTs 10/2020 no airway obstruction, nml FEV1, DLCO 74%   09/2020 ONO/RA shows minimal desaturation for 7 minutes less than 88%.>> dc O2   04/2018 NPSG -mild OSA, AHI 9/hour, RDI 30/hour 05/2018 CPAP titration >> 10 cm , medium mask, wt 174 lbs  Review of Systems neg for any significant sore throat, dysphagia, itching, sneezing, nasal congestion or excess/ purulent secretions, fever, chills, sweats, unintended wt loss, pleuritic or exertional cp, hempoptysis, orthopnea pnd or change in chronic leg swelling. Also  denies presyncope, palpitations, heartburn, abdominal pain, nausea, vomiting, diarrhea or change in bowel or urinary habits, dysuria,hematuria, rash, arthralgias, visual complaints, headache, numbness weakness or ataxia.     Objective:   Physical Exam  Gen. Pleasant, well-nourished, in no distress ENT - no thrush, no pallor/icterus,no post nasal drip Neck: No JVD, no thyromegaly, no carotid bruits Lungs: no use of accessory muscles, no dullness to percussion, clear without rales or rhonchi  Cardiovascular: Rhythm regular, heart sounds  normal, no murmurs or gallops, no peripheral edema Musculoskeletal: No deformities, no cyanosis or clubbing        Assessment & Plan:

## 2021-09-13 DIAGNOSIS — E1143 Type 2 diabetes mellitus with diabetic autonomic (poly)neuropathy: Secondary | ICD-10-CM | POA: Diagnosis not present

## 2021-09-13 DIAGNOSIS — E113313 Type 2 diabetes mellitus with moderate nonproliferative diabetic retinopathy with macular edema, bilateral: Secondary | ICD-10-CM | POA: Diagnosis not present

## 2021-09-13 DIAGNOSIS — I10 Essential (primary) hypertension: Secondary | ICD-10-CM | POA: Diagnosis not present

## 2021-09-13 DIAGNOSIS — R6889 Other general symptoms and signs: Secondary | ICD-10-CM | POA: Diagnosis not present

## 2021-09-14 DIAGNOSIS — E1143 Type 2 diabetes mellitus with diabetic autonomic (poly)neuropathy: Secondary | ICD-10-CM | POA: Diagnosis not present

## 2021-09-14 DIAGNOSIS — I10 Essential (primary) hypertension: Secondary | ICD-10-CM | POA: Diagnosis not present

## 2021-09-20 ENCOUNTER — Encounter (HOSPITAL_COMMUNITY): Payer: Self-pay | Admitting: *Deleted

## 2021-09-20 ENCOUNTER — Emergency Department (HOSPITAL_COMMUNITY)
Admission: EM | Admit: 2021-09-20 | Discharge: 2021-09-20 | Disposition: A | Discharge status: Home / Self Care | Payer: Medicare HMO | Attending: Emergency Medicine | Admitting: Emergency Medicine

## 2021-09-20 ENCOUNTER — Emergency Department (HOSPITAL_COMMUNITY): Payer: Medicare HMO

## 2021-09-20 ENCOUNTER — Other Ambulatory Visit: Payer: Self-pay

## 2021-09-20 DIAGNOSIS — I10 Essential (primary) hypertension: Secondary | ICD-10-CM | POA: Insufficient documentation

## 2021-09-20 DIAGNOSIS — Z20822 Contact with and (suspected) exposure to covid-19: Secondary | ICD-10-CM | POA: Insufficient documentation

## 2021-09-20 DIAGNOSIS — E119 Type 2 diabetes mellitus without complications: Secondary | ICD-10-CM | POA: Diagnosis not present

## 2021-09-20 DIAGNOSIS — J45909 Unspecified asthma, uncomplicated: Secondary | ICD-10-CM | POA: Insufficient documentation

## 2021-09-20 DIAGNOSIS — R0789 Other chest pain: Secondary | ICD-10-CM | POA: Diagnosis not present

## 2021-09-20 DIAGNOSIS — E876 Hypokalemia: Secondary | ICD-10-CM | POA: Insufficient documentation

## 2021-09-20 DIAGNOSIS — R110 Nausea: Secondary | ICD-10-CM | POA: Diagnosis not present

## 2021-09-20 DIAGNOSIS — R0781 Pleurodynia: Secondary | ICD-10-CM | POA: Diagnosis present

## 2021-09-20 DIAGNOSIS — M549 Dorsalgia, unspecified: Secondary | ICD-10-CM | POA: Insufficient documentation

## 2021-09-20 DIAGNOSIS — Z794 Long term (current) use of insulin: Secondary | ICD-10-CM | POA: Diagnosis not present

## 2021-09-20 DIAGNOSIS — I309 Acute pericarditis, unspecified: Secondary | ICD-10-CM | POA: Diagnosis not present

## 2021-09-20 DIAGNOSIS — R079 Chest pain, unspecified: Secondary | ICD-10-CM | POA: Diagnosis not present

## 2021-09-20 DIAGNOSIS — R0602 Shortness of breath: Secondary | ICD-10-CM | POA: Diagnosis not present

## 2021-09-20 DIAGNOSIS — Z79899 Other long term (current) drug therapy: Secondary | ICD-10-CM | POA: Insufficient documentation

## 2021-09-20 DIAGNOSIS — I300 Acute nonspecific idiopathic pericarditis: Secondary | ICD-10-CM | POA: Diagnosis not present

## 2021-09-20 DIAGNOSIS — I3139 Other pericardial effusion (noninflammatory): Secondary | ICD-10-CM | POA: Diagnosis not present

## 2021-09-20 LAB — CBC
HCT: 41.4 % (ref 36.0–46.0)
Hemoglobin: 13.7 g/dL (ref 12.0–15.0)
MCH: 26.6 pg (ref 26.0–34.0)
MCHC: 33.1 g/dL (ref 30.0–36.0)
MCV: 80.2 fL (ref 80.0–100.0)
Platelets: 334 10*3/uL (ref 150–400)
RBC: 5.16 MIL/uL — ABNORMAL HIGH (ref 3.87–5.11)
RDW: 14.9 % (ref 11.5–15.5)
WBC: 6.1 10*3/uL (ref 4.0–10.5)
nRBC: 0 % (ref 0.0–0.2)

## 2021-09-20 LAB — BASIC METABOLIC PANEL
Anion gap: 7 (ref 5–15)
BUN: 17 mg/dL (ref 6–20)
CO2: 28 mmol/L (ref 22–32)
Calcium: 9.4 mg/dL (ref 8.9–10.3)
Chloride: 101 mmol/L (ref 98–111)
Creatinine, Ser: 0.79 mg/dL (ref 0.44–1.00)
GFR, Estimated: >60 mL/min (ref >60)
Glucose, Bld: 177 mg/dL — ABNORMAL HIGH (ref 70–99)
Potassium: 3.3 mmol/L — ABNORMAL LOW (ref 3.5–5.1)
Sodium: 136 mmol/L (ref 135–145)

## 2021-09-20 LAB — TROPONIN I (HIGH SENSITIVITY)
Troponin I (High Sensitivity): 5 ng/L (ref <18)
Troponin I (High Sensitivity): 6 ng/L (ref <18)

## 2021-09-20 LAB — RESP PANEL BY RT-PCR (FLU A&B, COVID) ARPGX2
Influenza A by PCR: NEGATIVE
Influenza B by PCR: NEGATIVE
SARS Coronavirus 2 by RT PCR: NEGATIVE

## 2021-09-20 MED ORDER — IBUPROFEN 400 MG PO TABS
600.0000 mg | ORAL_TABLET | Freq: Once | ORAL | Status: AC
Start: 2021-09-20 — End: 2021-09-20
  Administered 2021-09-20: 600 mg via ORAL
  Filled 2021-09-20: qty 2

## 2021-09-20 MED ORDER — ALBUTEROL SULFATE (2.5 MG/3ML) 0.083% IN NEBU
2.5000 mg | INHALATION_SOLUTION | Freq: Once | RESPIRATORY_TRACT | Status: AC
Start: 2021-09-20 — End: 2021-09-20
  Administered 2021-09-20: 2.5 mg via RESPIRATORY_TRACT
  Filled 2021-09-20: qty 3

## 2021-09-20 MED ORDER — IOHEXOL 350 MG/ML SOLN
100.0000 mL | Freq: Once | INTRAVENOUS | Status: AC | PRN
  Administered 2021-09-20: 75 mL via INTRAVENOUS

## 2021-09-20 NOTE — Discharge Instructions (Signed)
Take ibuprofen, 600 mg, every 8 hours for the next week.  After that, take as needed.  Follow-up with your primary care doctor when possible.  Return to the emergency department for any worsening symptoms.

## 2021-09-20 NOTE — ED Triage Notes (Addendum)
Charted in error.

## 2021-09-20 NOTE — ED Triage Notes (Signed)
Pt c/o left sided chest pain with breathing x 3 days. Denies SOB.

## 2021-09-20 NOTE — ED Notes (Signed)
Patient transported to X-ray 

## 2021-09-20 NOTE — ED Notes (Signed)
Patient transported to CT 

## 2021-09-20 NOTE — ED Provider Notes (Signed)
[facility] EMERGENCY DEPARTMENT Provider Note   CSN: [id number] Arrival date & time: 09/20/21  1146     History  Chief Complaint  Patient presents with   Chest Pain    Amy Cobb is a 61 y.o. female.   Chest Pain Associated symptoms: back pain   Patient presents for chest pain and shortness of breath for the past 3 days.  Pain is pleuritic in nature.  It is worse with, position.  It is improved with leaning forward.  Over the past several days, it has waxed and waned in severity.  Currently, it is mild.  It is left-sided and radiates to left scapular region.  It is not worse with exertion or movements. HPI: A 61 year old patient with a history of treated diabetes and hypertension presents for evaluation of chest pain. Initial onset of pain was less than one hour ago. The patient's chest pain is not worse with exertion. The patient complains of nausea. The patient's chest pain is middle- or left-sided, is not well-localized, is not described as heaviness/pressure/tightness, is not sharp and does not radiate to the arms/jaw/neck. The patient denies diaphoresis. The patient has no history of stroke, has no history of peripheral artery disease, has not smoked in the past 90 days, has no relevant family history of coronary artery disease (first degree relative at less than age 42), has no history of hypercholesterolemia and does not have an elevated BMI (>=30).   Home Medications Prior to Admission medications   Medication Sig Start Date End Date Taking? Authorizing Provider  Aflibercept (EYLEA) 2 MG/0.05ML SOLN 1 each by Intravitreal route See admin instructions. Every 5 weeks    [provider]  albuterol (PROVENTIL) (2.5 MG/3ML) 0.083% nebulizer solution Take 2.5 mg by nebulization every 8 (eight) hours as needed for wheezing or shortness of breath.     [provider]  albuterol (VENTOLIN HFA) 108 (90 Base) MCG/ACT inhaler Inhale 2 puffs into the lungs every 4  (four) hours as needed for wheezing or shortness of breath. 04/07/21   Rigoberto Noel, MD  Alcohol Swabs (B-D SINGLE USE SWABS REGULAR) PADS  03/07/20   [provider]  brimonidine (ALPHAGAN) 0.2 % ophthalmic solution Place 1 drop into the right eye in the morning and at bedtime. 12/09/19   [provider]  budesonide-formoterol (SYMBICORT) 160-4.5 MCG/ACT inhaler Inhale 2 puffs into the lungs 2 (two) times daily. 03/20/21   Rigoberto Noel, MD  chlorthalidone (HYGROTON) 25 MG tablet Take 25 mg by mouth daily.    [provider]  cloNIDine (CATAPRES) 0.2 MG tablet Take 1 tablet (0.2 mg total) by mouth 3 (three) times daily. 01/21/18   Strader, Fransisco Hertz, PA-C  cyanocobalamin (,VITAMIN B-12,) 1000 MCG/ML injection Inject 1,000 mcg as directed every 30 (thirty) days.    [provider]  DROPLET PEN NEEDLES 32G X 4 MM MISC  08/24/20   [provider]  famotidine (PEPCID) 20 MG tablet Take 20 mg by mouth at bedtime.    [provider]  fluticasone (FLONASE) 50 MCG/ACT nasal spray Place 1 spray into both nostrils daily.    [provider]  glimepiride (AMARYL) 4 MG tablet  08/15/20   [provider]  hydrALAZINE (APRESOLINE) 50 MG tablet TAKE 1 TABLET THREE TIMES DAILY 02/21/21   Satira Sark, MD  insulin glargine (LANTUS) 100 UNIT/ML injection Inject 50 Units into the skin 2 (two) times daily.    [provider]  insulin lispro (  HUMALOG) 100 UNIT/ML KwikPen Inject 25 Units into the skin 3 (three) times daily.    [provider]  labetalol (NORMODYNE) 200 MG tablet TAKE 2 TABLETS (400 MG TOTAL) BY MOUTH 2 (TWO) TIMES DAILY. DOSE INCREASE 05/01/21   Satira Sark, MD  losartan (COZAAR) 100 MG tablet Take 1 tablet (100 mg total) by mouth daily. 06/19/21   Satira Sark, MD  minoxidil (LONITEN) 2.5 MG tablet Take 2.5 mg by mouth daily.    [provider]      Allergies    Augmentin [amoxicillin-pot  clavulanate], Penicillins, Clavulanic acid, Gabapentin, Moxifloxacin, and Novolog [insulin aspart]    Review of Systems   Review of Systems  Respiratory:  Positive for chest tightness.   Cardiovascular:  Positive for chest pain.  Musculoskeletal:  Positive for back pain.   Physical Exam Updated Vital Signs BP (!) 176/83    Pulse 83    Temp 98 F (36.7 C)    Resp 14    Ht 5\' 7"  (1.702 m)    Wt 78.9 kg    SpO2 97%    BMI 27.25 kg/m  Physical Exam Vitals and nursing note reviewed.  Constitutional:      General: She is not in acute distress.    Appearance: She is well-developed and normal weight. She is not ill-appearing, toxic-appearing or diaphoretic.  HENT:     Head: Normocephalic and atraumatic.  Eyes:     Extraocular Movements: Extraocular movements intact.     Conjunctiva/sclera: Conjunctivae normal.  Neck:     Vascular: No JVD.  Cardiovascular:     Rate and Rhythm: Normal rate and regular rhythm.     Heart sounds: No murmur heard. Pulmonary:     Effort: Pulmonary effort is normal. No respiratory distress.     Breath sounds: Normal breath sounds. No decreased breath sounds, wheezing, rhonchi or rales.  Chest:     Chest wall: No tenderness, crepitus or edema.  Abdominal:     Palpations: Abdomen is soft.     Tenderness: There is no abdominal tenderness.  Musculoskeletal:        General: No swelling.     Cervical back: Neck supple.     Right lower leg: No edema.     Left lower leg: No edema.  Skin:    General: Skin is warm and dry.     Capillary Refill: Capillary refill takes less than 2 seconds.     Coloration: Skin is not cyanotic or pale.  Neurological:     General: No focal deficit present.     Mental Status: She is alert and oriented to person, place, and time.     Cranial Nerves: No cranial nerve deficit.     Motor: No weakness.  Psychiatric:        Mood and Affect: Mood normal.        Behavior: Behavior normal.    ED Results / Procedures / Treatments    Labs (all labs ordered are listed, but only abnormal results are displayed) Labs Reviewed  BASIC METABOLIC PANEL - Abnormal; Notable for the following components:      Result Value   Potassium 3.3 (*)    Glucose, Bld 177 (*)    All other components within normal limits  CBC - Abnormal; Notable for the following components:   RBC 5.16 (*)    All other components within normal limits  RESP PANEL BY RT-PCR (FLU A&B, COVID) ARPGX2  TROPONIN I (HIGH SENSITIVITY)  TROPONIN I (HIGH SENSITIVITY)    EKG EKG Interpretation  Date/Time:  Wednesday September 20 2021 12:28:36 EST Ventricular Rate:  90 PR Interval:  207 QRS Duration: 91 QT Interval:  383 QTC Calculation: 469 R Axis:   -62 Text Interpretation: Sinus rhythm Borderline prolonged PR interval Probable left atrial enlargement Nonspecific T abnormalities, lateral leads Confirmed by Godfrey Pick [phone]) on 09/20/2021 1:37:28 PM  Radiology No results found.  Procedures Procedures    Medications Ordered in ED Medications  albuterol (PROVENTIL) (2.5 MG/3ML) 0.083% nebulizer solution 2.5 mg (2.5 mg Nebulization Given 09/20/21 1336)  iohexol (OMNIPAQUE) 350 MG/ML injection 100 mL (75 mLs Intravenous Contrast Given 09/20/21 1507)  ibuprofen (ADVIL) tablet 600 mg (600 mg Oral Given 09/20/21 1532)    ED Course/ Medical Decision Making/ A&P   HEAR Score: 4                       Medical Decision Making Amount and/or Complexity of Data Reviewed Labs: ordered. Radiology: ordered.  Risk Prescription drug management.   This patient presents to the ED for concern of chest pain, this involves an extensive number of treatment options, and is a complaint that carries with it a high risk of complications and morbidity.  The differential diagnosis includes ACS, PE, aortic dissection, pericarditis, costochondritis, asthma exacerbation   Co morbidities that complicate the patient evaluation  Asthma, HTN, T2DM, depression, anxiety   Additional  history obtained:  Additional history obtained from N/A External records from outside source obtained and reviewed including EMR   Lab Tests:  I Ordered, and personally interpreted labs.  The pertinent results include: Slight hypokalemia, normal hemoglobin, no leukocytosis, normal troponins   Imaging Studies ordered:  I ordered imaging studies including chest x-ray, CTA chest I independently visualized and interpreted imaging which showed no evidence of PE, no focal opacities in the lungs, evidence of small pericardial effusion I agree with the radiologist interpretation   Cardiac Monitoring:  The patient was maintained on a cardiac monitor.  I personally viewed and interpreted the cardiac monitored which showed an underlying rhythm of: Sinus rhythm   Medicines ordered and prescription drug management:  I ordered medication including albuterol for chest tightness, ibuprofen for pericarditis Reevaluation of the patient after these medicines showed that the patient improved I have reviewed the patients home medicines and have made adjustments as needed  Problem List / ED Course:  Healthy 61 year old female presenting for left-sided chest pain.  Chest pain does radiate to the area of her left scapula.  This has been persistent over the past several days but has been waxing and waning in severity.  She has noticed that it is worse with end expiration as well as recumbent positioning.  It is improved with leaning forward.  She feels that it is possibly related to an oncoming asthma exacerbation.  Although no wheezing is appreciated on lung auscultation, patient was given albuterol for symptomatic relief.  She underwent a diagnostic work-up which showed normal troponins.  CTA of chest did not show any evidence of PE.  It did show evidence of small pericardial effusion, although I was not able to identify this on bedside ultrasound.  Patient's symptoms are classic for pericarditis.  Although  there is no evidence of this on EKG, patient to be treated empirically for idiopathic pericarditis.  She does state that she has had a recent viral URI.  She was advised to continue scheduled ibuprofen. I spoke with her about the  dangers of a rapidly worsening pericardial effusion.  Patient was advised to return to the ED if she does experience any worsening symptoms.  She is stable for discharge at this time.   Reevaluation:  After the interventions noted above, I reevaluated the patient and found that they have :improved   Social Determinants of Health:  Patient has access to outpatient medical care and is followed by cardiology.   Dispostion:  After consideration of the diagnostic results and the patients response to treatment, I feel that the patent would benefit from discharge with strict return precautions.          Final Clinical Impression(s) / ED Diagnoses Final diagnoses:  Acute idiopathic pericarditis    Rx / DC Orders ED Discharge Orders     None         Godfrey Pick, MD 09/22/21 1749

## 2021-09-26 DIAGNOSIS — I10 Essential (primary) hypertension: Secondary | ICD-10-CM | POA: Diagnosis not present

## 2021-09-26 DIAGNOSIS — J440 Chronic obstructive pulmonary disease with acute lower respiratory infection: Secondary | ICD-10-CM | POA: Diagnosis not present

## 2021-09-26 DIAGNOSIS — Z6827 Body mass index (BMI) 27.0-27.9, adult: Secondary | ICD-10-CM | POA: Diagnosis not present

## 2021-09-26 DIAGNOSIS — E1143 Type 2 diabetes mellitus with diabetic autonomic (poly)neuropathy: Secondary | ICD-10-CM | POA: Diagnosis not present

## 2021-09-26 DIAGNOSIS — Z0000 Encounter for general adult medical examination without abnormal findings: Secondary | ICD-10-CM | POA: Diagnosis not present

## 2021-10-17 DIAGNOSIS — E1143 Type 2 diabetes mellitus with diabetic autonomic (poly)neuropathy: Secondary | ICD-10-CM | POA: Diagnosis not present

## 2021-10-17 DIAGNOSIS — I10 Essential (primary) hypertension: Secondary | ICD-10-CM | POA: Diagnosis not present

## 2021-10-17 DIAGNOSIS — J440 Chronic obstructive pulmonary disease with acute lower respiratory infection: Secondary | ICD-10-CM | POA: Diagnosis not present

## 2021-10-27 ENCOUNTER — Ambulatory Visit: Payer: Medicare HMO | Admitting: Obstetrics & Gynecology

## 2021-11-14 ENCOUNTER — Other Ambulatory Visit: Payer: Self-pay

## 2021-11-14 ENCOUNTER — Ambulatory Visit: Payer: Medicare HMO | Admitting: Podiatry

## 2021-11-14 ENCOUNTER — Encounter: Payer: Self-pay | Admitting: Podiatry

## 2021-11-14 ENCOUNTER — Ambulatory Visit: Payer: Medicare HMO

## 2021-11-14 DIAGNOSIS — Z794 Long term (current) use of insulin: Secondary | ICD-10-CM

## 2021-11-14 DIAGNOSIS — M79674 Pain in right toe(s): Secondary | ICD-10-CM | POA: Diagnosis not present

## 2021-11-14 DIAGNOSIS — M79675 Pain in left toe(s): Secondary | ICD-10-CM

## 2021-11-14 DIAGNOSIS — B351 Tinea unguium: Secondary | ICD-10-CM

## 2021-11-14 DIAGNOSIS — E1165 Type 2 diabetes mellitus with hyperglycemia: Secondary | ICD-10-CM | POA: Diagnosis not present

## 2021-11-14 DIAGNOSIS — M2142 Flat foot [pes planus] (acquired), left foot: Secondary | ICD-10-CM

## 2021-11-14 NOTE — Progress Notes (Signed)
SITUATION ?Reason for Consult: Evaluation for Prefabricated Diabetic Shoes and Custom Diabetic Inserts. ?Patient / Caregiver Report: Patient would like well fitting shoes ? ?OBJECTIVE DATA: ?Patient History / Diagnosis:  ?  ICD-10-CM   ?1. Type 2 diabetes mellitus with hyperglycemia, with long-term current use of insulin (HCC)  E11.65   ? Z79.4   ?  ?2. Pes planus of both feet  M21.41   ? M21.42   ?  ? ?Physician treating diabetes  Neale Burly, MD ? ?Current or Previous Devices:   Current user ? ?In-Person Foot Examination: ?Ulcers & Callousing:   None and no history ?Deformities:    Pes planus ?Sensation:    Compromised  ?Shoe Size:     21M ? ?ORTHOTIC RECOMMENDATION ?Recommended Devices: ?- 1x pair prefabricated PDAC approved diabetic shoes; Patient Selected Apex Caryl Comes [phone] Grey/Purple Size 21M ?- 3x pair custom-to-patient PDAC approved vacuum formed diabetic insoles. ? ?GOALS OF SHOES AND INSOLES ?- Reduce shear and pressure ?- Reduce / Prevent callus formation ?- Reduce / Prevent ulceration ?- Protect the fragile healing compromised diabetic foot. ? ?Patient would benefit from diabetic shoes and inserts as patient has diabetes mellitus and the patient has one or more of the following conditions: ?- History of partial or complete amputation of the foot ?- History of previous foot ulceration. ?- History of pre-ulcerative callus ?- Peripheral neuropathy with evidence of callus formation ?- Foot deformity ?- Poor circulation ? ?ACTIONS PERFORMED ?Potential out of pocket cost was communicated to patient. Patient understood and consented to measurement and casting. Patient was casted for insoles via crush box and measured for shoes via brannock device. Procedure was explained and patient tolerated procedure well. All questions were answered and concerns addressed. Casts were shipped to central fabrication for HOLD until Certificate of Medical Necessity or otherwise necessary authorization from insurance is  obtained. ? ?PLAN ?Shoes are to be ordered and casts released from hold once all appropriate paperwork is complete. Patient is to be contacted and scheduled for fitting once shoes and insoles have been fabricated and received. ? ?

## 2021-11-14 NOTE — Progress Notes (Signed)
This patient presents to the office with chief complaint of long thick nails and diabetic feet.  This patient  says there  is  no pain and discomfort in their feet.  This patient says there are long thick painful nails.  These nails are painful walking and wearing shoes.  Patient has no history of infection or drainage from both feet.  Patient is unable to  self treat his own nails . This patient presents  to the office today for treatment of the  long nails and a foot evaluation due to history of  diabetes with neuropathy. ? ?General Appearance  Alert, conversant and in no acute stress. ? ?Vascular  Dorsalis pedis and posterior tibial  pulses are palpable  bilaterally.  Capillary return is within normal limits  bilaterally. Temperature is within normal limits  bilaterally. ? ?Neurologic  Senn-Weinstein monofilament wire test within normal limits  bilaterally. Muscle power within normal limits bilaterally. ? ?Nails Thick disfigured discolored nails with subungual debris  from hallux to fifth toes bilaterally. No evidence of bacterial infection or drainage bilaterally. ? ?Orthopedic  No limitations of motion of motion feet .  No crepitus or effusions noted.  No bony pathology or digital deformities noted.HAV  B/L.  Pes planus. 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 ? ?Skin  normotropic skin with no porokeratosis noted bilaterally.  No signs of infections or ulcers noted.    ? ?Onychomycosis  Diabetes with no foot complications ? ?IE  Debride nails x 10.  A diabetic foot exam was performed and there is no evidence of any vascular or neurologic pathology. Patient qualifies for diabetic shoes due to DPN, and HAV and pes planus.   RTC 3 months. ? ? ?Gardiner Barefoot DPM   ?

## 2021-11-17 DIAGNOSIS — E1143 Type 2 diabetes mellitus with diabetic autonomic (poly)neuropathy: Secondary | ICD-10-CM | POA: Diagnosis not present

## 2021-11-17 DIAGNOSIS — I10 Essential (primary) hypertension: Secondary | ICD-10-CM | POA: Diagnosis not present

## 2021-11-29 DIAGNOSIS — E113313 Type 2 diabetes mellitus with moderate nonproliferative diabetic retinopathy with macular edema, bilateral: Secondary | ICD-10-CM | POA: Diagnosis not present

## 2021-11-29 DIAGNOSIS — R6889 Other general symptoms and signs: Secondary | ICD-10-CM | POA: Diagnosis not present

## 2021-12-15 DIAGNOSIS — E1143 Type 2 diabetes mellitus with diabetic autonomic (poly)neuropathy: Secondary | ICD-10-CM | POA: Diagnosis not present

## 2021-12-15 DIAGNOSIS — I10 Essential (primary) hypertension: Secondary | ICD-10-CM | POA: Diagnosis not present

## 2021-12-15 DIAGNOSIS — J440 Chronic obstructive pulmonary disease with acute lower respiratory infection: Secondary | ICD-10-CM | POA: Diagnosis not present

## 2021-12-17 DIAGNOSIS — J440 Chronic obstructive pulmonary disease with acute lower respiratory infection: Secondary | ICD-10-CM | POA: Diagnosis not present

## 2021-12-17 DIAGNOSIS — E1143 Type 2 diabetes mellitus with diabetic autonomic (poly)neuropathy: Secondary | ICD-10-CM | POA: Diagnosis not present

## 2021-12-17 DIAGNOSIS — I10 Essential (primary) hypertension: Secondary | ICD-10-CM | POA: Diagnosis not present

## 2021-12-26 DIAGNOSIS — E1143 Type 2 diabetes mellitus with diabetic autonomic (poly)neuropathy: Secondary | ICD-10-CM | POA: Diagnosis not present

## 2021-12-26 DIAGNOSIS — Z6827 Body mass index (BMI) 27.0-27.9, adult: Secondary | ICD-10-CM | POA: Diagnosis not present

## 2021-12-26 DIAGNOSIS — I10 Essential (primary) hypertension: Secondary | ICD-10-CM | POA: Diagnosis not present

## 2021-12-26 DIAGNOSIS — J440 Chronic obstructive pulmonary disease with acute lower respiratory infection: Secondary | ICD-10-CM | POA: Diagnosis not present

## 2021-12-26 DIAGNOSIS — Z0000 Encounter for general adult medical examination without abnormal findings: Secondary | ICD-10-CM | POA: Diagnosis not present

## 2021-12-29 DIAGNOSIS — E1143 Type 2 diabetes mellitus with diabetic autonomic (poly)neuropathy: Secondary | ICD-10-CM | POA: Diagnosis not present

## 2021-12-29 DIAGNOSIS — I10 Essential (primary) hypertension: Secondary | ICD-10-CM | POA: Diagnosis not present

## 2021-12-29 DIAGNOSIS — J440 Chronic obstructive pulmonary disease with acute lower respiratory infection: Secondary | ICD-10-CM | POA: Diagnosis not present

## 2021-12-29 DIAGNOSIS — Z0000 Encounter for general adult medical examination without abnormal findings: Secondary | ICD-10-CM | POA: Diagnosis not present

## 2022-01-05 ENCOUNTER — Telehealth: Payer: Self-pay

## 2022-01-05 NOTE — Telephone Encounter (Signed)
CMN received - shoes ordered  Apex Orson Eva [phone]

## 2022-01-08 ENCOUNTER — Telehealth: Payer: Self-pay

## 2022-01-08 NOTE — Telephone Encounter (Signed)
Did you receive all the paper work for her diabetic shoes? Please advise

## 2022-01-10 ENCOUNTER — Telehealth: Payer: Self-pay

## 2022-01-10 NOTE — Telephone Encounter (Signed)
Returned phonecall to let patient know her documents and shoes are in, and that insoles are in fabrication. We expect everything to be ready in the next couple of weeks.

## 2022-01-17 ENCOUNTER — Telehealth: Payer: Self-pay | Admitting: Cardiology

## 2022-01-17 DIAGNOSIS — E1143 Type 2 diabetes mellitus with diabetic autonomic (poly)neuropathy: Secondary | ICD-10-CM | POA: Diagnosis not present

## 2022-01-17 DIAGNOSIS — I10 Essential (primary) hypertension: Secondary | ICD-10-CM | POA: Diagnosis not present

## 2022-01-17 DIAGNOSIS — J440 Chronic obstructive pulmonary disease with acute lower respiratory infection: Secondary | ICD-10-CM | POA: Diagnosis not present

## 2022-01-17 MED ORDER — LOSARTAN POTASSIUM 100 MG PO TABS: 100.0000 mg | ORAL_TABLET | Freq: Every day | ORAL | 1 refills | Status: DC

## 2022-01-17 NOTE — Telephone Encounter (Signed)
Completed. Sent to [place] per pt's request.

## 2022-01-17 NOTE — Telephone Encounter (Signed)
Patient requested a refill for losartan wants that sent to [facility]. Stated its normally mailed but, she will not get it in time.

## 2022-01-24 NOTE — Telephone Encounter (Signed)
Pt notified that her shoes have come in and we are waiting for her inserts to come in. Pt satisfied with update and will wait on call for pick up.

## 2022-01-30 ENCOUNTER — Ambulatory Visit (INDEPENDENT_AMBULATORY_CARE_PROVIDER_SITE_OTHER): Payer: Medicare HMO

## 2022-01-30 DIAGNOSIS — Z794 Long term (current) use of insulin: Secondary | ICD-10-CM | POA: Diagnosis not present

## 2022-01-30 DIAGNOSIS — M2142 Flat foot [pes planus] (acquired), left foot: Secondary | ICD-10-CM

## 2022-01-30 DIAGNOSIS — M2141 Flat foot [pes planus] (acquired), right foot: Secondary | ICD-10-CM

## 2022-01-30 DIAGNOSIS — E1165 Type 2 diabetes mellitus with hyperglycemia: Secondary | ICD-10-CM | POA: Diagnosis not present

## 2022-01-30 NOTE — Progress Notes (Signed)
SITUATION Reason for Visit: Fitting of Diabetic Shoes & Insoles Patient / Caregiver Report:  Patient is satisfied with fit and function of shoes and insoles.  OBJECTIVE DATA: Patient History / Diagnosis:     ICD-10-CM   1. Type 2 diabetes mellitus with hyperglycemia, with long-term current use of insulin (HCC)  E11.65    Z79.4     2. Pes planus of both feet  M21.41    M21.42       Change in Status:   None  ACTIONS PERFORMED: In-Person Delivery, patient was fit with: - 1x pair [id number] PDAC approved prefabricated Diabetic Shoes: Apex [id number] - 3x pair [id number] PDAC approved vacuum formed custom diabetic insoles; [employer]: [id number]  Shoes and insoles were verified for structural integrity and safety. Patient wore shoes and insoles in office. Skin was inspected and free of areas of concern after wearing shoes and inserts. Shoes and inserts fit properly. Patient / Caregiver provided with ferbal instruction and demonstration regarding donning, doffing, wear, care, proper fit, function, purpose, cleaning, and use of shoes and insoles ' and in all related precautions and risks and benefits regarding shoes and insoles. Patient / Caregiver was instructed to wear properly fitting socks with shoes at all times. Patient was also provided with verbal instruction regarding how to report any failures or malfunctions of shoes or inserts, and necessary follow up care. Patient / Caregiver was also instructed to contact physician regarding change in status that may affect function of shoes and inserts.   Patient / Caregiver verbalized undersatnding of instruction provided. Patient / Caregiver demonstrated independence with proper donning and doffing of shoes and inserts.  PLAN Patient to follow with treating physician as recommended. Plan of care was discussed with and agreed upon by patient and/or caregiver. All questions were answered and concerns addressed.

## 2022-02-08 DIAGNOSIS — Z794 Long term (current) use of insulin: Secondary | ICD-10-CM | POA: Diagnosis not present

## 2022-02-08 DIAGNOSIS — E113313 Type 2 diabetes mellitus with moderate nonproliferative diabetic retinopathy with macular edema, bilateral: Secondary | ICD-10-CM | POA: Diagnosis not present

## 2022-02-16 DIAGNOSIS — E1143 Type 2 diabetes mellitus with diabetic autonomic (poly)neuropathy: Secondary | ICD-10-CM | POA: Diagnosis not present

## 2022-02-16 DIAGNOSIS — I10 Essential (primary) hypertension: Secondary | ICD-10-CM | POA: Diagnosis not present

## 2022-02-28 DIAGNOSIS — E113313 Type 2 diabetes mellitus with moderate nonproliferative diabetic retinopathy with macular edema, bilateral: Secondary | ICD-10-CM | POA: Diagnosis not present

## 2022-02-28 DIAGNOSIS — R6889 Other general symptoms and signs: Secondary | ICD-10-CM | POA: Diagnosis not present

## 2022-03-13 ENCOUNTER — Encounter: Payer: Self-pay | Admitting: Pulmonary Disease

## 2022-03-13 ENCOUNTER — Ambulatory Visit: Payer: Medicare HMO | Admitting: Pulmonary Disease

## 2022-03-13 VITALS — BP 134/80 | HR 74 | Temp 98.9°F | Ht 67.0 in | Wt 169.8 lb

## 2022-03-13 DIAGNOSIS — G4733 Obstructive sleep apnea (adult) (pediatric): Secondary | ICD-10-CM | POA: Diagnosis not present

## 2022-03-13 DIAGNOSIS — J4531 Mild persistent asthma with (acute) exacerbation: Secondary | ICD-10-CM | POA: Diagnosis not present

## 2022-03-13 DIAGNOSIS — I3139 Other pericardial effusion (noninflammatory): Secondary | ICD-10-CM | POA: Diagnosis not present

## 2022-03-13 MED ORDER — PREDNISONE 10 MG PO TABS: ORAL_TABLET | ORAL | 0 refills | Status: AC

## 2022-03-13 NOTE — Assessment & Plan Note (Signed)
Small pericardial effusion noted on CTA on 09/2021. We will obtain limited echo to follow

## 2022-03-13 NOTE — Progress Notes (Signed)
   Subjective:    Patient ID: Amy Cobb, female    DOB: 06/16/1961, 61 y.o.   MRN: [id number]  HPI    61 yo ex-smoker  For FU of asthma and OSA She smoked about a pack per week starting at age 24 until age 76, less than 10 pack years.  She reports that her breathing problem started when she moved from [place] to [place] in 2005.  She reports prednisone tapers 5-6 times in the year and hospitalized at least once or twice every year.    PMH - hospitalized with Covid pneumonia 03/2020, discharged on 4 L oxygen.    She has a delayed sleep phase syndrome with bedtime as late as 2 AM  -insulin requiring diabetes, hypertension requiring 6 medications  Chief Complaint  Patient presents with   Follow-up    Has not used cpap since last appt    She is compliant with Symbicort twice daily.  She had an exacerbation about a month ago and was given an antibiotic, steroid taper and cough syrup by her PCP.  She feels like she has not fully recovered yet and continues to have trouble breathing and coughing.  She had an ED visit 09/2021 for chest pain, CT angiogram was negative for pulmonary embolism but showed small pericardial effusion.  She was treated with NSAIDs for presumed pericarditis. I reviewed echo from 11/2020 which did not show any evidence of pericardial effusion  She has been unable to tolerate CPAP and would like to pursue alternative therapy.  Partner continues to note loud snoring and witnessed apneas  Significant tests/ events reviewed 04/2021 lexiscan myoview >> low risk study   PFTs 10/2020 no airway obstruction, nml FEV1, DLCO 74%   09/2020 ONO/RA shows minimal desaturation for 7 minutes less than 88%.>> dc O2   04/2018 NPSG -mild OSA, AHI 9/hour, RDI 30/hour 05/2018 CPAP titration >> 10 cm , medium mask, wt 174 lbs  Review of Systems  neg for any significant sore throat, dysphagia, itching, sneezing, nasal congestion or excess/ purulent secretions, fever, chills,  sweats, unintended wt loss, pleuritic or exertional cp, hempoptysis, orthopnea pnd or change in chronic leg swelling. Also denies presyncope, palpitations, heartburn, abdominal pain, nausea, vomiting, diarrhea or change in bowel or urinary habits, dysuria,hematuria, rash, arthralgias, visual complaints, headache, numbness weakness or ataxia.     Objective:   Physical Exam  Gen. Pleasant, well-nourished, in no distress ENT - no thrush, no pallor/icterus,no post nasal drip Neck: No JVD, no thyromegaly, no carotid bruits Lungs: no use of accessory muscles, no dullness to percussion, clear without rales or rhonchi  Cardiovascular: Rhythm regular, heart sounds  normal, no murmurs or gallops, no peripheral edema Musculoskeletal: No deformities, no cyanosis or clubbing        Assessment & Plan:

## 2022-03-13 NOTE — Patient Instructions (Addendum)
X Limited echo for pericardial effusion  X Prednisone 10 mg tabs Take 4 tabs  daily with food x 4 days, then 3 tabs daily x 4 days, then 2 tabs daily x 4 days, then 1 tab daily x4 days then stop. #40  X Change from Symbicort to trelegy once daily  X Refer to dr Ron Parker for OSA  X Obtain blood work from PCP  X spirometry pre-post

## 2022-03-13 NOTE — Assessment & Plan Note (Addendum)
She is doing better than last year and has not required hospitalization.  However she continues to have flares of her asthma. We will step up therapy from Symbicort to Trelegy.  She already has 3 discs with her and she will start.  We will provide her with a course of prednisone for 2 weeks. Also obtain spirometry in the future to look for residual airway obstruction We will also obtain CBC with differential and RAST panel and consider Biologics in the future if she continues to have flares on Trelegy

## 2022-03-13 NOTE — Assessment & Plan Note (Signed)
Unable to tolerate CPAP. We will refer her to orthodontist for oral appliance

## 2022-03-14 ENCOUNTER — Telehealth: Payer: Self-pay

## 2022-03-14 LAB — RESPIRATORY ALLERGY PROFILE REGION II ~~LOC~~
Allergen, A. alternata, m6: 1.11 kU/L — ABNORMAL HIGH
Allergen, Cedar tree, t12: <0.1 kU/L
Allergen, Comm Silver Birch, t9: <0.1 kU/L
Allergen, Cottonwood, t14: <0.1 kU/L
Allergen, D pternoyssinus,d7: 0.23 kU/L — ABNORMAL HIGH
Allergen, Mouse Urine Protein, e78: <0.1 kU/L
Allergen, Mulberry, t76: <0.1 kU/L
Allergen, Oak,t7: <0.1 kU/L
Allergen, P. notatum, m1: <0.1 kU/L
Aspergillus fumigatus, m3: <0.1 kU/L
Bermuda Grass: <0.1 kU/L
Box Elder IgE: <0.1 kU/L
CLADOSPORIUM HERBARUM (M2) IGE: <0.1 kU/L
COMMON RAGWEED (SHORT) (W1) IGE: 0.11 kU/L — ABNORMAL HIGH
Cat Dander: 1.26 kU/L — ABNORMAL HIGH
Class: 0
Class: 0
Class: 0
Class: 0
Class: 0
Class: 0
Class: 0
Class: 0
Class: 0
Class: 0
Class: 0
Class: 0
Class: 0
Class: 0
Class: 0
Class: 0
Class: 2
Class: 2
Class: 3
Cockroach: <0.1 kU/L
D. farinae: 0.18 kU/L — ABNORMAL HIGH
Dog Dander: 8.95 kU/L — ABNORMAL HIGH
Elm IgE: <0.1 kU/L
IgE (Immunoglobulin E), Serum: 165 kU/L — ABNORMAL HIGH (ref <=114)
Johnson Grass: <0.1 kU/L
Pecan/Hickory Tree IgE: 0.12 kU/L — ABNORMAL HIGH
Rough Pigweed  IgE: <0.1 kU/L
Sheep Sorrel IgE: <0.1 kU/L
Timothy Grass: 0.22 kU/L — ABNORMAL HIGH

## 2022-03-14 LAB — CBC WITH DIFFERENTIAL/PLATELET
Absolute Monocytes: 574 cells/uL (ref 200–950)
Basophils Absolute: 29 cells/uL (ref 0–200)
Basophils Relative: 0.5 %
Eosinophils Absolute: 87 cells/uL (ref 15–500)
Eosinophils Relative: 1.5 %
HCT: 38.6 % (ref 35.0–45.0)
Hemoglobin: 12.7 g/dL (ref 11.7–15.5)
Lymphs Abs: 1781 cells/uL (ref 850–3900)
MCH: 26.7 pg — ABNORMAL LOW (ref 27.0–33.0)
MCHC: 32.9 g/dL (ref 32.0–36.0)
MCV: 81.3 fL (ref 80.0–100.0)
MPV: 10.7 fL (ref 7.5–12.5)
Monocytes Relative: 9.9 %
Neutro Abs: 3329 cells/uL (ref 1500–7800)
Neutrophils Relative %: 57.4 %
Platelets: 359 10*3/uL (ref 140–400)
RBC: 4.75 10*6/uL (ref 3.80–5.10)
RDW: 14.5 % (ref 11.0–15.0)
Total Lymphocyte: 30.7 %
WBC: 5.8 10*3/uL (ref 3.8–10.8)

## 2022-03-14 LAB — INTERPRETATION:

## 2022-03-14 NOTE — Telephone Encounter (Signed)
error 

## 2022-03-19 DIAGNOSIS — J440 Chronic obstructive pulmonary disease with acute lower respiratory infection: Secondary | ICD-10-CM | POA: Diagnosis not present

## 2022-03-19 DIAGNOSIS — I10 Essential (primary) hypertension: Secondary | ICD-10-CM | POA: Diagnosis not present

## 2022-03-19 DIAGNOSIS — E1143 Type 2 diabetes mellitus with diabetic autonomic (poly)neuropathy: Secondary | ICD-10-CM | POA: Diagnosis not present

## 2022-03-22 ENCOUNTER — Other Ambulatory Visit: Payer: Self-pay

## 2022-03-22 ENCOUNTER — Ambulatory Visit (HOSPITAL_COMMUNITY)
Admission: RE | Admit: 2022-03-22 | Discharge: 2022-03-22 | Disposition: A | Discharge status: Home / Self Care | Payer: Medicare HMO | Source: Ambulatory Visit | Attending: Pulmonary Disease | Admitting: Pulmonary Disease

## 2022-03-22 DIAGNOSIS — M810 Age-related osteoporosis without current pathological fracture: Secondary | ICD-10-CM | POA: Diagnosis not present

## 2022-03-22 DIAGNOSIS — I3139 Other pericardial effusion (noninflammatory): Secondary | ICD-10-CM | POA: Diagnosis not present

## 2022-03-22 DIAGNOSIS — M8589 Other specified disorders of bone density and structure, multiple sites: Secondary | ICD-10-CM | POA: Diagnosis not present

## 2022-03-22 LAB — ECHOCARDIOGRAM LIMITED
Area-P 1/2: 3.17 cm2
S' Lateral: 2.1 cm

## 2022-03-22 NOTE — Progress Notes (Signed)
*  PRELIMINARY RESULTS* Echocardiogram Limited 2-D Echocardiogram  has been performed.  [name] 03/22/2022, 1:39 PM

## 2022-03-23 ENCOUNTER — Other Ambulatory Visit: Payer: Self-pay

## 2022-03-23 DIAGNOSIS — I3139 Other pericardial effusion (noninflammatory): Secondary | ICD-10-CM

## 2022-03-26 ENCOUNTER — Inpatient Hospital Stay (HOSPITAL_COMMUNITY): Admission: RE | Admit: 2022-03-26 | Payer: Medicare HMO | Source: Ambulatory Visit

## 2022-03-28 DIAGNOSIS — E1143 Type 2 diabetes mellitus with diabetic autonomic (poly)neuropathy: Secondary | ICD-10-CM | POA: Diagnosis not present

## 2022-03-28 DIAGNOSIS — I10 Essential (primary) hypertension: Secondary | ICD-10-CM | POA: Diagnosis not present

## 2022-03-28 DIAGNOSIS — J440 Chronic obstructive pulmonary disease with acute lower respiratory infection: Secondary | ICD-10-CM | POA: Diagnosis not present

## 2022-03-28 DIAGNOSIS — Z6826 Body mass index (BMI) 26.0-26.9, adult: Secondary | ICD-10-CM | POA: Diagnosis not present

## 2022-04-03 ENCOUNTER — Ambulatory Visit (HOSPITAL_COMMUNITY)
Admission: RE | Admit: 2022-04-03 | Discharge: 2022-04-03 | Disposition: A | Discharge status: Home / Self Care | Payer: Medicare HMO | Source: Ambulatory Visit | Attending: Pulmonary Disease | Admitting: Pulmonary Disease

## 2022-04-03 DIAGNOSIS — J4531 Mild persistent asthma with (acute) exacerbation: Secondary | ICD-10-CM | POA: Insufficient documentation

## 2022-04-03 LAB — PULMONARY FUNCTION TEST
DL/VA % pred: 102 %
DL/VA: 4.26 ml/min/mmHg/L
DLCO unc % pred: 82 %
DLCO unc: 18.65 ml/min/mmHg
FEF 25-75 Post: 3.18 L/sec
FEF 25-75 Pre: 3.37 L/sec
FEF2575-%Change-Post: -5 %
FEF2575-%Pred-Post: 121 %
FEF2575-%Pred-Pre: 128 %
FEV1-%Change-Post: -1 %
FEV1-%Pred-Post: 83 %
FEV1-%Pred-Pre: 84 %
FEV1-Post: 2.42 L
FEV1-Pre: 2.45 L
FEV1FVC-%Change-Post: -4 %
FEV1FVC-%Pred-Pre: 107 %
FEV6-%Change-Post: 4 %
FEV6-%Pred-Post: 83 %
FEV6-%Pred-Pre: 79 %
FEV6-Post: 3.01 L
FEV6-Pre: 2.87 L
FEV6FVC-%Change-Post: 0 %
FEV6FVC-%Pred-Post: 103 %
FEV6FVC-%Pred-Pre: 102 %
FVC-%Change-Post: 3 %
FVC-%Pred-Post: 80 %
FVC-%Pred-Pre: 77 %
FVC-Post: 3.01 L
FVC-Pre: 2.9 L
Post FEV1/FVC ratio: 80 %
Post FEV6/FVC ratio: 100 %
Pre FEV1/FVC ratio: 85 %
Pre FEV6/FVC Ratio: 99 %
RV % pred: 83 %
RV: 1.76 L
TLC % pred: 87 %
TLC: 4.83 L

## 2022-04-03 MED ORDER — ALBUTEROL SULFATE (2.5 MG/3ML) 0.083% IN NEBU
2.5000 mg | INHALATION_SOLUTION | Freq: Once | RESPIRATORY_TRACT | Status: AC
  Administered 2022-04-03: 2.5 mg via RESPIRATORY_TRACT

## 2022-04-16 ENCOUNTER — Telehealth: Payer: Self-pay | Admitting: Pulmonary Disease

## 2022-04-17 NOTE — Telephone Encounter (Signed)
Rigoberto Noel, MD  04/05/2022 10:02 PM EDT     Much improved Near normal PFTs   Called and went over results with patient. She voiced understanding.

## 2022-04-24 ENCOUNTER — Other Ambulatory Visit: Payer: Self-pay | Admitting: Cardiology

## 2022-04-24 ENCOUNTER — Telehealth: Payer: Self-pay

## 2022-04-24 MED ORDER — LABETALOL HCL 200 MG PO TABS: ORAL_TABLET | ORAL | 3 refills | Status: DC

## 2022-04-24 NOTE — Telephone Encounter (Signed)
Medication refill request approved for Labetalol 200 mg tablets and sent to pharmacy

## 2022-06-06 DIAGNOSIS — R6889 Other general symptoms and signs: Secondary | ICD-10-CM | POA: Diagnosis not present

## 2022-06-06 DIAGNOSIS — E113313 Type 2 diabetes mellitus with moderate nonproliferative diabetic retinopathy with macular edema, bilateral: Secondary | ICD-10-CM | POA: Diagnosis not present

## 2022-06-11 ENCOUNTER — Ambulatory Visit (HOSPITAL_COMMUNITY)
Admission: RE | Admit: 2022-06-11 | Discharge: 2022-06-11 | Disposition: A | Discharge status: Home / Self Care | Payer: Medicare HMO | Source: Ambulatory Visit | Attending: Cardiology | Admitting: Cardiology

## 2022-06-11 DIAGNOSIS — I3139 Other pericardial effusion (noninflammatory): Secondary | ICD-10-CM | POA: Insufficient documentation

## 2022-06-11 LAB — ECHOCARDIOGRAM LIMITED: Area-P 1/2: 3.03 cm2

## 2022-06-11 NOTE — Progress Notes (Signed)
*  PRELIMINARY RESULTS* Echocardiogram Limited 2-D Echocardiogram  has been performed.  Amy Cobb 06/11/2022, 1:52 PM

## 2022-07-06 ENCOUNTER — Other Ambulatory Visit: Payer: Self-pay | Admitting: Cardiology

## 2022-07-10 ENCOUNTER — Ambulatory Visit: Payer: Medicare HMO | Attending: Cardiology | Admitting: Student

## 2022-07-10 ENCOUNTER — Encounter: Payer: Self-pay | Admitting: Student

## 2022-07-10 ENCOUNTER — Encounter: Payer: Self-pay | Admitting: *Deleted

## 2022-07-10 VITALS — BP 134/68 | HR 92 | Ht 67.0 in | Wt 172.8 lb

## 2022-07-10 DIAGNOSIS — R0789 Other chest pain: Secondary | ICD-10-CM

## 2022-07-10 DIAGNOSIS — I3139 Other pericardial effusion (noninflammatory): Secondary | ICD-10-CM

## 2022-07-10 DIAGNOSIS — I10 Essential (primary) hypertension: Secondary | ICD-10-CM | POA: Diagnosis not present

## 2022-07-10 DIAGNOSIS — G4733 Obstructive sleep apnea (adult) (pediatric): Secondary | ICD-10-CM | POA: Diagnosis not present

## 2022-07-10 DIAGNOSIS — R002 Palpitations: Secondary | ICD-10-CM | POA: Diagnosis not present

## 2022-07-10 NOTE — Patient Instructions (Signed)
Medication Instructions:  Your physician recommends that you continue on your current medications as directed. Please refer to the Current Medication list given to you today.  *If you need a refill on your cardiac medications before your next appointment, please call your pharmacy*   Lab Work: NONE   If you have labs (blood work) drawn today and your tests are completely normal, you will receive your results only by: [place] (if you have MyChart) OR A paper copy in the mail If you have any lab test that is abnormal or we need to change your treatment, we will call you to review the results.   Testing/Procedures: NONE    Follow-Up: At [facility], you and your health needs are our priority.  As part of our continuing mission to provide you with exceptional heart care, we have created designated Provider Care Teams.  These Care Teams include your primary Cardiologist (physician) and Advanced Practice Providers (APPs -  Physician Assistants and Nurse Practitioners) who all work together to provide you with the care you need, when you need it.  We recommend signing up for the patient portal called "MyChart".  Sign up information is provided on this After Visit Summary.  MyChart is used to connect with patients for Virtual Visits (Telemedicine).  Patients are able to view lab/test results, encounter notes, upcoming appointments, etc.  Non-urgent messages can be sent to your provider as well.   To learn more about what you can do with MyChart, go to [email or web address].    Your next appointment:   6 month(s)  The format for your next appointment:   In Person  Provider:   Rozann Lesches, MD    Other Instructions Thank you for choosing [facility]!    Important Information About Sugar

## 2022-07-10 NOTE — Progress Notes (Signed)
Cardiology Office Note    Date:  07/10/2022   ID:  Amy Cobb, DOB Jun 13, 1961, MRN [id number]  PCP:  Neale Burly, MD  Cardiologist: Rozann Lesches, MD    Chief Complaint  Patient presents with   Follow-up    Annual Visit    History of Present Illness:    Amy Cobb is a 61 y.o. female with past medical history of HTN, hypertensive heart disease, Type II DM, asthma and OSA (intolerant to CPAP) who presents to the office today for annual follow-up.  She was examined by Dr. Domenic Polite in 05/2021 and denied any recent change in her respiratory status or associated chest discomfort. She was continued on her current cardiac medications and informed to follow-up in 1 year.  She did follow-up with Pulmonology and had an echocardiogram in 03/2022 which showed a preserved EF of 65 to 70% but was noted to have a small to moderate circumferential pericardial effusion with no evidence of tamponade. She had a follow-up limited study in 05/2022 which showed her EF remained normal at 65 to 70% with grade 1 diastolic dysfunction and her pericardial effusion had decreased in size and was now trivial.  In talking with the patient today, she reports being under more stress over the past few months as she has experienced multiple losses in her family and her brother recently passed away from a presumed cardiac issue 2 months ago. She has intermittent dyspnea but denies any acute changes in this. Around the time of her echocardiogram in 03/2022, she reports having a respiratory illness. She experiences occasional chest discomfort but describes this as a sharp pain only lasting for a few seconds and spontaneously resolving. No association with exertion.  No specific orthopnea, PND or pitting edema.   Past Medical History:  Diagnosis Date   Acid reflux    Anxiety    Asthma    COPD (chronic obstructive pulmonary disease) (HCC)    Depression    Essential hypertension    OSA on CPAP    Type  2 diabetes mellitus ([place])    Vulvar cancer ([place]) 2003    Past Surgical History:  Procedure Laterality Date   ABDOMINAL HYSTERECTOMY     BREAST CYST EXCISION Right    CERVICAL FUSION     CO2 LASER APPLICATION  1594   CO2 LASER VAPORIZATION OF THE VULVAR LESION   NASAL SEPTOPLASTY W/ TURBINOPLASTY N/A 02/17/2020   Procedure: BILATERAL NASAL SEPTOPLASTY;  Surgeon: Leta Baptist, MD;  Location: [facility];  Service: ENT;  Laterality: N/A;   SHOULDER SURGERY     TURBINATE REDUCTION Bilateral 02/17/2020   Procedure: BILATERAL TURBINATE REDUCTION;  Surgeon: Leta Baptist, MD;  Location: [facility];  Service: ENT;  Laterality: Bilateral;    Current Medications: Outpatient Medications Prior to Visit  Medication Sig Dispense Refill   Aflibercept (EYLEA) 2 MG/0.05ML SOLN 1 each by Intravitreal route See admin instructions. Every 5 weeks     albuterol (PROVENTIL) (2.5 MG/3ML) 0.083% nebulizer solution Take 2.5 mg by nebulization every 8 (eight) hours as needed for wheezing or shortness of breath.      albuterol (VENTOLIN HFA) 108 (90 Base) MCG/ACT inhaler Inhale 2 puffs into the lungs every 4 (four) hours as needed for wheezing or shortness of breath. 8 g 2   Alcohol Swabs (B-D SINGLE USE SWABS REGULAR) PADS      budesonide-formoterol (SYMBICORT) 160-4.5 MCG/ACT inhaler Inhale 2 puffs into the lungs 2 (two) times daily. 1 each 6  chlorthalidone (HYGROTON) 25 MG tablet Take 25 mg by mouth daily.     cloNIDine (CATAPRES) 0.2 MG tablet Take 1 tablet (0.2 mg total) by mouth 3 (three) times daily.     cyanocobalamin (,VITAMIN B-12,) 1000 MCG/ML injection Inject 1,000 mcg as directed every 30 (thirty) days.     DROPLET PEN NEEDLES 32G X 4 MM MISC      famotidine (PEPCID) 20 MG tablet Take 20 mg by mouth at bedtime.     fluticasone (FLONASE) 50 MCG/ACT nasal spray Place 1 spray into both nostrils daily.     glimepiride (AMARYL) 4 MG tablet      hydrALAZINE (APRESOLINE) 50 MG tablet TAKE 1 TABLET THREE TIMES DAILY 270  tablet 3   insulin glargine (LANTUS) 100 UNIT/ML injection Inject 50 Units into the skin 2 (two) times daily.     insulin lispro (HUMALOG) 100 UNIT/ML KwikPen Inject 25 Units into the skin 3 (three) times daily.     labetalol (NORMODYNE) 200 MG tablet TAKE 2 TABLETS (400 MG TOTAL) BY MOUTH 2 (TWO) TIMES DAILY. DOSE INCREASE 360 tablet 3   losartan (COZAAR) 100 MG tablet TAKE 1 TABLET EVERY DAY 90 tablet 2   minoxidil (LONITEN) 2.5 MG tablet Take 2.5 mg by mouth daily.     No facility-administered medications prior to visit.     Allergies:   Augmentin [amoxicillin-pot clavulanate], Penicillins, Clavulanic acid, Gabapentin, Moxifloxacin, and Novolog [insulin aspart]   Social History   Socioeconomic History   Marital status: Soil scientist    Spouse name: Not on file   Number of children: Not on file   Years of education: Not on file   Highest education level: Not on file  Occupational History   Not on file  Tobacco Use   Smoking status: Former    Packs/day: 0.25    Types: Cigarettes    Quit date: 12/25/2008    Years since quitting: 13.5   Smokeless tobacco: Never   Tobacco comments:    Used to smoke about 3 a day  Vaping Use   Vaping Use: Never used  Substance and Sexual Activity   Alcohol use: Yes    Comment: occaisonally   Drug use: No   Sexual activity: Yes    Partners: Male    Comment: 1st intercourse- 56, partners- (18 female) (37 female),  married- 21 yrs   Other Topics Concern   Not on file  Social History Narrative   Not on file   Social Determinants of Health   Financial Resource Strain: Not on file  Food Insecurity: Not on file  Transportation Needs: Not on file  Physical Activity: Not on file  Stress: Not on file  Social Connections: Not on file     Family History:  The patient's family history includes Breast cancer in her cousin; Breast cancer (age of onset: 73) in her sister; Diabetes in her cousin and sister; Hypertension in her mother and sister.    Review of Systems:    Please see the history of present illness.     All other systems reviewed and are otherwise negative except as noted above.   Physical Exam:    VS:  BP 134/68   Pulse 92   Ht '5\' 7"'$  (1.702 m)   Wt 172 lb 12.8 oz (78.4 kg)   SpO2 97%   BMI 27.06 kg/m    General: Well developed, well nourished,female appearing in no acute distress. Head: Normocephalic, atraumatic. Neck: No carotid bruits. JVD not  elevated.  Lungs: Respirations regular and unlabored, without wheezes or rales.  Heart: Regular rate and rhythm. No S3 or S4.  No murmur, no rubs, or gallops appreciated. Abdomen: Appears non-distended. No obvious abdominal masses. Msk:  Strength and tone appear normal for age. No obvious joint deformities or effusions. Extremities: No clubbing or cyanosis. No pitting edema.  Distal pedal pulses are 2+ bilaterally. Neuro: Alert and oriented X 3. Moves all extremities spontaneously. No focal deficits noted. Psych:  Responds to questions appropriately with a normal affect. Skin: No rashes or lesions noted  Wt Readings from Last 3 Encounters:  07/10/22 172 lb 12.8 oz (78.4 kg)  03/13/22 169 lb 12.8 oz (77 kg)  09/20/21 174 lb (78.9 kg)     Studies/Labs Reviewed:   EKG:  EKG is not ordered today.   Recent Labs: 09/20/2021: BUN 17; Creatinine, Ser 0.79; Potassium 3.3; Sodium 136 03/13/2022: Hemoglobin 12.7; Platelets 359   Lipid Panel No results found for: "CHOL", "TRIG", "HDL", "CHOLHDL", "VLDL", "LDLCALC", "LDLDIRECT"  Additional studies/ records that were reviewed today include:   NST: 04/2021   Lexiscan stress is electrically negative for ischemia   Myoview shows normal perfusion  No ischemia   LVEF is normal at 67%   Low risk study  Limited Echo: 06/11/2022 IMPRESSIONS     1. Left ventricular ejection fraction, by estimation, is 65 to 70%. The  left ventricle has normal function. Left ventricular diastolic parameters  are consistent with Grade I  diastolic dysfunction (impaired relaxation).   2. Right ventricular systolic function is normal. The right ventricular  size is normal.   3. There is no evidence of cardiac tamponade.Trivial circumferential  pericardial effusion   4. The inferior vena cava is normal in size with greater than 50%  respiratory variability, suggesting right atrial pressure of 3 mmHg.   5. Limited echo to evaluate pericardial effusion   Assessment:    1. Essential hypertension   2. Palpitations   3. Pericardial effusion   4. Atypical chest pain   5. OSA (obstructive sleep apnea)      Plan:   In order of problems listed above:  1. HTN - Her BP is at 134/68 during today's visit but has been variable when checked at home in the setting of increased stress and anxiety. Encouraged her to continue to follow readings at home.  For now, will continue current medical therapy with Chlorthalidone 25 mg daily, Hydralazine 50 mg 3 times daily, Labetalol 400 mg twice daily, Minoxidil 2.'5mg'$  daily and Losartan 100 mg daily. She did have recent labs with her PCP and we will request a copy of these.   2. Palpitations - She reports symptoms have overall improved since her last visit. Remains on Labetalol 400 mg twice daily.  3.  Pericardial Effusion - She had a small to moderate pericardial effusion by echocardiogram in 03/2022 in the setting of a respiratory illness but this had decreased and was only trivial by follow-up echocardiogram in 05/2022. No indication for further testing at this time.  4. Atypical Chest Pain - She reports occasional episodes of chest discomfort lasting for only seconds at a time and no association with exertion. Overall, atypical for a cardiac etiology. Given her recent low-risk NST last year, would not plan for further ischemic testing at this time. I encouraged her to make [place] aware if symptoms increase in frequency or severity as we could obtain a Coronary CT for additional evaluation.  5.  OSA - Being followed  by Pulmonology. She was previously intolerant to CPAP and has been referred for consideration of an oral device to help with this.   Medication Adjustments/Labs and Tests Ordered: Current medicines are reviewed at length with the patient today.  Concerns regarding medicines are outlined above.  Medication changes, Labs and Tests ordered today are listed in the Patient Instructions below. Patient Instructions  Medication Instructions:  Your physician recommends that you continue on your current medications as directed. Please refer to the Current Medication list given to you today.  *If you need a refill on your cardiac medications before your next appointment, please call your pharmacy*   Lab Work: NONE   If you have labs (blood work) drawn today and your tests are completely normal, you will receive your results only by: [place] (if you have MyChart) OR A paper copy in the mail If you have any lab test that is abnormal or we need to change your treatment, we will call you to review the results.   Testing/Procedures: NONE    Follow-Up: At [facility], you and your health needs are our priority.  As part of our continuing mission to provide you with exceptional heart care, we have created designated Provider Care Teams.  These Care Teams include your primary Cardiologist (physician) and Advanced Practice Providers (APPs -  Physician Assistants and Nurse Practitioners) who all work together to provide you with the care you need, when you need it.  We recommend signing up for the patient portal called "MyChart".  Sign up information is provided on this After Visit Summary.  MyChart is used to connect with patients for Virtual Visits (Telemedicine).  Patients are able to view lab/test results, encounter notes, upcoming appointments, etc.  Non-urgent messages can be sent to your provider as well.   To learn more about what you can do with MyChart, go to  [email or web address].    Your next appointment:   6 month(s)  The format for your next appointment:   In Person  Provider:   Rozann Lesches, MD    Other Instructions Thank you for choosing [facility]!    Important Information About Sugar         Signed, Erma Heritage, PA-C  07/10/2022 5:04 PM    [facility] [address] [place], [place] [address] Phone: [phone] Fax: [phone]

## 2022-07-23 DIAGNOSIS — J449 Chronic obstructive pulmonary disease, unspecified: Secondary | ICD-10-CM | POA: Diagnosis not present

## 2022-07-23 DIAGNOSIS — I10 Essential (primary) hypertension: Secondary | ICD-10-CM | POA: Diagnosis not present

## 2022-07-23 DIAGNOSIS — E1143 Type 2 diabetes mellitus with diabetic autonomic (poly)neuropathy: Secondary | ICD-10-CM | POA: Diagnosis not present

## 2022-07-23 DIAGNOSIS — Z6826 Body mass index (BMI) 26.0-26.9, adult: Secondary | ICD-10-CM | POA: Diagnosis not present

## 2022-07-23 DIAGNOSIS — J440 Chronic obstructive pulmonary disease with acute lower respiratory infection: Secondary | ICD-10-CM | POA: Diagnosis not present

## 2022-07-23 DIAGNOSIS — F411 Generalized anxiety disorder: Secondary | ICD-10-CM | POA: Diagnosis not present

## 2022-08-02 DIAGNOSIS — I10 Essential (primary) hypertension: Secondary | ICD-10-CM | POA: Diagnosis not present

## 2022-08-17 ENCOUNTER — Ambulatory Visit: Payer: Medicare HMO | Admitting: Podiatry

## 2022-08-17 DIAGNOSIS — S90121A Contusion of right lesser toe(s) without damage to nail, initial encounter: Secondary | ICD-10-CM

## 2022-08-17 NOTE — Progress Notes (Signed)
Subjective:  Patient ID: Amy Cobb, female    DOB: 09-15-1960,  MRN: [id number]  Chief Complaint  Patient presents with   Toe Injury    Patient jammed toe 3 weeks away - 5 th digit and states the pain is not constant but she can feel it throughout the day     61 y.o. female presents with the above complaint.  Patient presents with right fifth digit toe contusion.  Patient states she slipped and the toe hit and stop the foot.  She said happened about 3 weeks ago she went to get it evaluated pain is not constant but it is noticeable throughout the day.  She is been wearing regular shoes denies seeing anyone else prior to seeing me she is a diabetic she wants to make sure she does not lose the toe.   Review of Systems: Negative except as noted in the HPI. Denies N/V/F/Ch.  Past Medical History:  Diagnosis Date   Acid reflux    Anxiety    Asthma    COPD (chronic obstructive pulmonary disease) (HCC)    Depression    Essential hypertension    OSA on CPAP    Type 2 diabetes mellitus ([place])    Vulvar cancer ([place]) 2003    Current Outpatient Medications:    Aflibercept (EYLEA) 2 MG/0.05ML SOLN, 1 each by Intravitreal route See admin instructions. Every 5 weeks, Disp: , Rfl:    albuterol (PROVENTIL) (2.5 MG/3ML) 0.083% nebulizer solution, Take 2.5 mg by nebulization every 8 (eight) hours as needed for wheezing or shortness of breath. , Disp: , Rfl:    albuterol (VENTOLIN HFA) 108 (90 Base) MCG/ACT inhaler, Inhale 2 puffs into the lungs every 4 (four) hours as needed for wheezing or shortness of breath., Disp: 8 g, Rfl: 2   Alcohol Swabs (B-D SINGLE USE SWABS REGULAR) PADS, , Disp: , Rfl:    budesonide-formoterol (SYMBICORT) 160-4.5 MCG/ACT inhaler, Inhale 2 puffs into the lungs 2 (two) times daily., Disp: 1 each, Rfl: 6   chlorthalidone (HYGROTON) 25 MG tablet, Take 25 mg by mouth daily., Disp: , Rfl:    cloNIDine (CATAPRES) 0.2 MG tablet, Take 1 tablet (0.2 mg total) by mouth 3 (three)  times daily., Disp: , Rfl:    cyanocobalamin (,VITAMIN B-12,) 1000 MCG/ML injection, Inject 1,000 mcg as directed every 30 (thirty) days., Disp: , Rfl:    DROPLET PEN NEEDLES 32G X 4 MM MISC, , Disp: , Rfl:    famotidine (PEPCID) 20 MG tablet, Take 20 mg by mouth at bedtime., Disp: , Rfl:    fluticasone (FLONASE) 50 MCG/ACT nasal spray, Place 1 spray into both nostrils daily., Disp: , Rfl:    glimepiride (AMARYL) 4 MG tablet, , Disp: , Rfl:    hydrALAZINE (APRESOLINE) 50 MG tablet, TAKE 1 TABLET THREE TIMES DAILY, Disp: 270 tablet, Rfl: 3   insulin glargine (LANTUS) 100 UNIT/ML injection, Inject 50 Units into the skin 2 (two) times daily., Disp: , Rfl:    insulin lispro (HUMALOG) 100 UNIT/ML KwikPen, Inject 25 Units into the skin 3 (three) times daily., Disp: , Rfl:    labetalol (NORMODYNE) 200 MG tablet, TAKE 2 TABLETS (400 MG TOTAL) BY MOUTH 2 (TWO) TIMES DAILY. DOSE INCREASE, Disp: 360 tablet, Rfl: 3   losartan (COZAAR) 100 MG tablet, TAKE 1 TABLET EVERY DAY, Disp: 90 tablet, Rfl: 2   minoxidil (LONITEN) 2.5 MG tablet, Take 2.5 mg by mouth daily., Disp: , Rfl:   Social History   Tobacco  Use  Smoking Status Former   Packs/day: 0.25   Types: Cigarettes   Quit date: 12/25/2008   Years since quitting: 13.6  Smokeless Tobacco Never  Tobacco Comments   Used to smoke about 3 a day    Allergies  Allergen Reactions   Augmentin [Amoxicillin-Pot Clavulanate] Anaphylaxis and Other (See Comments)    Hallucinations.   Penicillins Anaphylaxis and Nausea And Vomiting    Hallucinations.   Clavulanic Acid    Gabapentin     Hallucinate    Moxifloxacin Hives   Novolog [Insulin Aspart] Rash   Objective:  There were no vitals filed for this visit. There is no height or weight on file to calculate BMI. Constitutional Well developed. Well nourished.  Vascular Dorsalis pedis pulses palpable bilaterally. Posterior tibial pulses palpable bilaterally. Capillary refill normal to all digits.  No  cyanosis or clubbing noted. Pedal hair growth normal.  Neurologic Normal speech. Oriented to person, place, and time. Epicritic sensation to light touch grossly present bilaterally.  Dermatologic Nails well groomed and normal in appearance. No open wounds. No skin lesions.  Orthopedic: Pain on palpation of right fifth digit.  No ecchymosis or bruising noted no open wounds or lesion noted.   Radiographs: None Assessment:   1. Contusion of fifth toe of right foot, initial encounter    Plan:  Patient was evaluated and treated and all questions answered.  Right fifth toe contusion without damage to the nail -All questions and concerns were discussed with the patient in extensive detail I discussed with her that at this time she will benefit from buddy splinting and surgical shoe for immobilization.  Being in a regular shoe can take longer time to heal.  She states understanding.  She will wear the surgical shoe at all times when she is on her foot.  If her pain is not decreasing she will come back and see me  No follow-ups on file.

## 2022-08-22 ENCOUNTER — Other Ambulatory Visit: Payer: Self-pay

## 2022-08-22 ENCOUNTER — Emergency Department (HOSPITAL_COMMUNITY): Payer: Medicare HMO

## 2022-08-22 ENCOUNTER — Emergency Department (HOSPITAL_COMMUNITY)
Admission: EM | Admit: 2022-08-22 | Discharge: 2022-08-22 | Disposition: A | Discharge status: Home / Self Care | Payer: Medicare HMO | Attending: Emergency Medicine | Admitting: Emergency Medicine

## 2022-08-22 DIAGNOSIS — E1165 Type 2 diabetes mellitus with hyperglycemia: Secondary | ICD-10-CM

## 2022-08-22 DIAGNOSIS — Z7984 Long term (current) use of oral hypoglycemic drugs: Secondary | ICD-10-CM | POA: Insufficient documentation

## 2022-08-22 DIAGNOSIS — I10 Essential (primary) hypertension: Secondary | ICD-10-CM | POA: Diagnosis not present

## 2022-08-22 DIAGNOSIS — R059 Cough, unspecified: Secondary | ICD-10-CM | POA: Diagnosis not present

## 2022-08-22 DIAGNOSIS — Z79899 Other long term (current) drug therapy: Secondary | ICD-10-CM | POA: Diagnosis not present

## 2022-08-22 DIAGNOSIS — Z8544 Personal history of malignant neoplasm of other female genital organs: Secondary | ICD-10-CM | POA: Insufficient documentation

## 2022-08-22 DIAGNOSIS — Z20822 Contact with and (suspected) exposure to covid-19: Secondary | ICD-10-CM | POA: Insufficient documentation

## 2022-08-22 DIAGNOSIS — R0602 Shortness of breath: Secondary | ICD-10-CM | POA: Diagnosis not present

## 2022-08-22 DIAGNOSIS — Z794 Long term (current) use of insulin: Secondary | ICD-10-CM | POA: Diagnosis not present

## 2022-08-22 DIAGNOSIS — J210 Acute bronchiolitis due to respiratory syncytial virus: Secondary | ICD-10-CM

## 2022-08-22 DIAGNOSIS — J441 Chronic obstructive pulmonary disease with (acute) exacerbation: Secondary | ICD-10-CM

## 2022-08-22 LAB — CBC WITH DIFFERENTIAL/PLATELET
Abs Immature Granulocytes: 0.01 10*3/uL (ref 0.00–0.07)
Basophils Absolute: 0.1 10*3/uL (ref 0.0–0.1)
Basophils Relative: 1 %
Eosinophils Absolute: 0 10*3/uL (ref 0.0–0.5)
Eosinophils Relative: 0 %
HCT: 46.2 % — ABNORMAL HIGH (ref 36.0–46.0)
Hemoglobin: 15.3 g/dL — ABNORMAL HIGH (ref 12.0–15.0)
Immature Granulocytes: 0 %
Lymphocytes Relative: 30 %
Lymphs Abs: 2 10*3/uL (ref 0.7–4.0)
MCH: 26.3 pg (ref 26.0–34.0)
MCHC: 33.1 g/dL (ref 30.0–36.0)
MCV: 79.5 fL — ABNORMAL LOW (ref 80.0–100.0)
Monocytes Absolute: 1.2 10*3/uL — ABNORMAL HIGH (ref 0.1–1.0)
Monocytes Relative: 18 %
Neutro Abs: 3.4 10*3/uL (ref 1.7–7.7)
Neutrophils Relative %: 51 %
Platelets: 306 10*3/uL (ref 150–400)
RBC: 5.81 MIL/uL — ABNORMAL HIGH (ref 3.87–5.11)
RDW: 15.2 % (ref 11.5–15.5)
WBC: 6.7 10*3/uL (ref 4.0–10.5)
nRBC: 0 % (ref 0.0–0.2)

## 2022-08-22 LAB — BASIC METABOLIC PANEL
Anion gap: 14 (ref 5–15)
BUN: 16 mg/dL (ref 6–20)
CO2: 27 mmol/L (ref 22–32)
Calcium: 9.6 mg/dL (ref 8.9–10.3)
Chloride: 95 mmol/L — ABNORMAL LOW (ref 98–111)
Creatinine, Ser: 0.95 mg/dL (ref 0.44–1.00)
GFR, Estimated: >60 mL/min (ref >60)
Glucose, Bld: 286 mg/dL — ABNORMAL HIGH (ref 70–99)
Potassium: 3.5 mmol/L (ref 3.5–5.1)
Sodium: 136 mmol/L (ref 135–145)

## 2022-08-22 LAB — RESP PANEL BY RT-PCR (RSV, FLU A&B, COVID)  RVPGX2
Influenza A by PCR: NEGATIVE
Influenza B by PCR: NEGATIVE
Resp Syncytial Virus by PCR: POSITIVE — AB
SARS Coronavirus 2 by RT PCR: NEGATIVE

## 2022-08-22 LAB — BRAIN NATRIURETIC PEPTIDE: B Natriuretic Peptide: 19 pg/mL (ref 0.0–100.0)

## 2022-08-22 MED ORDER — FLUTICASONE PROPIONATE 50 MCG/ACT NA SUSP: 1.0000 | Freq: Every day | NASAL | 2 refills | Status: AC

## 2022-08-22 MED ORDER — METHYLPREDNISOLONE SODIUM SUCC 125 MG IJ SOLR
125.0000 mg | Freq: Once | INTRAMUSCULAR | Status: AC
  Administered 2022-08-22: 125 mg via INTRAVENOUS
  Filled 2022-08-22: qty 2

## 2022-08-22 MED ORDER — ALBUTEROL SULFATE (2.5 MG/3ML) 0.083% IN NEBU
5.0000 mg | INHALATION_SOLUTION | Freq: Once | RESPIRATORY_TRACT | Status: AC
  Administered 2022-08-22: 5 mg via RESPIRATORY_TRACT
  Filled 2022-08-22: qty 6

## 2022-08-22 MED ORDER — PREDNISONE 50 MG PO TABS: 50.0000 mg | ORAL_TABLET | Freq: Every day | ORAL | 0 refills | Status: DC

## 2022-08-22 MED ORDER — ALBUTEROL SULFATE HFA 108 (90 BASE) MCG/ACT IN AERS
1.0000 | INHALATION_SPRAY | RESPIRATORY_TRACT | Status: DC | PRN
  Administered 2022-08-22: 2 via RESPIRATORY_TRACT
  Filled 2022-08-22: qty 6.7

## 2022-08-22 MED ORDER — OXYMETAZOLINE HCL 0.05 % NA SOLN: 1.0000 | Freq: Two times a day (BID) | NASAL | Status: DC | PRN

## 2022-08-22 MED ORDER — IPRATROPIUM BROMIDE 0.02 % IN SOLN
0.5000 mg | Freq: Once | RESPIRATORY_TRACT | Status: AC
  Administered 2022-08-22: 0.5 mg via RESPIRATORY_TRACT
  Filled 2022-08-22: qty 2.5

## 2022-08-22 MED ORDER — AEROCHAMBER Z-STAT PLUS/MEDIUM MISC
1.0000 | Freq: Once | Status: AC
  Administered 2022-08-22: 1
  Filled 2022-08-22: qty 1

## 2022-08-22 NOTE — ED Provider Notes (Signed)
[name] EMERGENCY DEPARTMENT Provider Note   CSN: [id number] Arrival date & time: 08/22/22  7829     History  Chief Complaint  Patient presents with   Shortness of Breath   Cough    Amy Cobb is a 62 y.o. female.  Pt is a 62 yo female with a pmhx significant for COPD, HTN, DM2, GERD, OSA on CPAP and vulvar cancer.  Pt said she's had sob for 4 days.  She has had a dry cough.  No fever.  Amy Cobb has a cough.         Home Medications Prior to Admission medications   Medication Sig Start Date End Date Taking? Authorizing Provider  fluticasone (FLONASE) 50 MCG/ACT nasal spray Place 1 spray into both nostrils daily. 08/22/22  Yes Isla Pence, MD  predniSONE (DELTASONE) 50 MG tablet Take 1 tablet (50 mg total) by mouth daily with breakfast. 08/22/22  Yes Isla Pence, MD  Aflibercept (EYLEA) 2 MG/0.05ML SOLN 1 each by Intravitreal route See admin instructions. Every 5 weeks    [provider]  albuterol (PROVENTIL) (2.5 MG/3ML) 0.083% nebulizer solution Take 2.5 mg by nebulization every 8 (eight) hours as needed for wheezing or shortness of breath.     [provider]  albuterol (VENTOLIN HFA) 108 (90 Base) MCG/ACT inhaler Inhale 2 puffs into the lungs every 4 (four) hours as needed for wheezing or shortness of breath. 04/07/21   Rigoberto Noel, MD  Alcohol Swabs (B-D SINGLE USE SWABS REGULAR) PADS  03/07/20   [provider]  budesonide-formoterol (SYMBICORT) 160-4.5 MCG/ACT inhaler Inhale 2 puffs into the lungs 2 (two) times daily. 03/20/21   Rigoberto Noel, MD  chlorthalidone (HYGROTON) 25 MG tablet Take 25 mg by mouth daily.    [provider]  cloNIDine (CATAPRES) 0.2 MG tablet Take 1 tablet (0.2 mg total) by mouth 3 (three) times daily. 01/21/18   Strader, Fransisco Hertz, PA-C  cyanocobalamin (,VITAMIN B-12,) 1000 MCG/ML injection Inject 1,000 mcg as directed every 30 (thirty) days.    [provider]  DROPLET PEN NEEDLES 32G X 4 MM  MISC  08/24/20   [provider]  famotidine (PEPCID) 20 MG tablet Take 20 mg by mouth at bedtime.    [provider]  fluticasone (FLONASE) 50 MCG/ACT nasal spray Place 1 spray into both nostrils daily.    [provider]  glimepiride (AMARYL) 4 MG tablet  08/15/20   [provider]  hydrALAZINE (APRESOLINE) 50 MG tablet TAKE 1 TABLET THREE TIMES DAILY 04/24/22   Satira Sark, MD  insulin glargine (LANTUS) 100 UNIT/ML injection Inject 50 Units into the skin 2 (two) times daily.    [provider]  insulin lispro (HUMALOG) 100 UNIT/ML KwikPen Inject 25 Units into the skin 3 (three) times daily.    [provider]  labetalol (NORMODYNE) 200 MG tablet TAKE 2 TABLETS (400 MG TOTAL) BY MOUTH 2 (TWO) TIMES DAILY. DOSE INCREASE 04/24/22   Satira Sark, MD  losartan (COZAAR) 100 MG tablet TAKE 1 TABLET EVERY DAY 07/06/22   Satira Sark, MD  minoxidil (LONITEN) 2.5 MG tablet Take 2.5 mg by mouth daily.    [provider]      Allergies    Augmentin [amoxicillin-pot clavulanate], Penicillins, Clavulanic acid, Gabapentin, Moxifloxacin, and Novolog [insulin aspart]    Review of Systems   Review of Systems  Respiratory:  Positive for shortness of breath and wheezing.   All other systems reviewed  and are negative.   Physical Exam Updated Vital Signs BP (!) 164/81   Pulse 80   Temp 98.6 F (37 C) (Oral)   Resp 20   Ht '5\' 7"'$  (1.702 m)   Wt 76.2 kg   SpO2 95%   BMI 26.31 kg/m  Physical Exam Vitals and nursing note reviewed.  Constitutional:      Appearance: She is well-developed.  HENT:     Head: Normocephalic and atraumatic.     Mouth/Throat:     Mouth: Mucous membranes are moist.     Pharynx: Oropharynx is clear.  Eyes:     Extraocular Movements: Extraocular movements intact.     Pupils: Pupils are equal, round, and reactive to light.  Cardiovascular:     Rate and Rhythm: Normal rate and regular rhythm.   Pulmonary:     Breath sounds: Wheezing present.  Abdominal:     General: Bowel sounds are normal.     Palpations: Abdomen is soft.  Musculoskeletal:        General: Normal range of motion.     Cervical back: Normal range of motion and neck supple.  Skin:    General: Skin is warm.     Capillary Refill: Capillary refill takes less than 2 seconds.  Neurological:     General: No focal deficit present.     Mental Status: She is alert and oriented to person, place, and time.  Psychiatric:        Mood and Affect: Mood normal.        Behavior: Behavior normal.     ED Results / Procedures / Treatments   Labs (all labs ordered are listed, but only abnormal results are displayed) Labs Reviewed  RESP PANEL BY RT-PCR (RSV, FLU A&B, COVID)  RVPGX2 - Abnormal; Notable for the following components:      Result Value   Resp Syncytial Virus by PCR POSITIVE (*)    All other components within normal limits  BASIC METABOLIC PANEL - Abnormal; Notable for the following components:   Chloride 95 (*)    Glucose, Bld 286 (*)    All other components within normal limits  CBC WITH DIFFERENTIAL/PLATELET - Abnormal; Notable for the following components:   RBC 5.81 (*)    Hemoglobin 15.3 (*)    HCT 46.2 (*)    MCV 79.5 (*)    Monocytes Absolute 1.2 (*)    All other components within normal limits  BRAIN NATRIURETIC PEPTIDE    EKG EKG Interpretation  Date/Time:  Wednesday August 22 2022 07:48:04 EST Ventricular Rate:  98 PR Interval:  164 QRS Duration: 100 QT Interval:  374 QTC Calculation: 478 R Axis:   -66 Text Interpretation: Sinus rhythm Probable left atrial enlargement Inferior infarct, old Anteroseptal infarct, old Lateral leads are also involved No significant change since last tracing Confirmed by Isla Pence [phone]) on 08/22/2022 8:27:13 AM  Radiology DG Chest Port 1 View  Result Date: 08/22/2022 CLINICAL DATA:  Shortness of breath and cough. EXAM: PORTABLE CHEST 1 VIEW  COMPARISON:  09/20/2021 FINDINGS: The lungs are clear without focal pneumonia, edema, pneumothorax or pleural effusion. The cardiopericardial silhouette is within normal limits for size. Subtle changes of atelectasis or scarring at the left base. The visualized bony structures of the thorax are unremarkable. Telemetry leads overlie the chest. IMPRESSION: No active disease. Electronically Signed   By: Misty Stanley M.D.   On: 08/22/2022 08:01    Procedures Procedures    Medications Ordered in ED  Medications  albuterol (VENTOLIN HFA) 108 (90 Base) MCG/ACT inhaler 1-2 puff (has no administration in time range)  aerochamber Z-Stat Plus/medium 1 each (has no administration in time range)  oxymetazoline (AFRIN) 0.05 % nasal spray 1 spray (has no administration in time range)  methylPREDNISolone sodium succinate (SOLU-MEDROL) 125 mg/2 mL injection 125 mg (125 mg Intravenous Given 08/22/22 0800)  albuterol (PROVENTIL) (2.5 MG/3ML) 0.083% nebulizer solution 5 mg (5 mg Nebulization Given 08/22/22 0820)  ipratropium (ATROVENT) nebulizer solution 0.5 mg (0.5 mg Nebulization Given 08/22/22 0820)    ED Course/ Medical Decision Making/ A&P                           Medical Decision Making Amount and/or Complexity of Data Reviewed Labs: ordered. Radiology: ordered.  Risk OTC drugs. Prescription drug management.   This patient presents to the ED for concern of sob, this involves an extensive number of treatment options, and is a complaint that carries with it a high risk of complications and morbidity.  The differential diagnosis includes pna, bronchitis, copd exac, covid/flu/rsv   Co morbidities that complicate the patient evaluation  COPD, HTN, DM2, GERD, OSA on CPAP and vulvar cancer   Additional history obtained:  Additional history obtained from epic chart review    Lab Tests:  I Ordered, and personally interpreted labs.  The pertinent results include:  cbc nl, bmp with glucose elevated at  286 (hx dm)   Imaging Studies ordered:  I ordered imaging studies including cxr  I independently visualized and interpreted imaging which showed No active disease.  I agree with the radiologist interpretation   Cardiac Monitoring:  The patient was maintained on a cardiac monitor.  I personally viewed and interpreted the cardiac monitored which showed an underlying rhythm of: st   Medicines ordered and prescription drug management:  I ordered medication including solumedrol and nebs  for sob  Reevaluation of the patient after these medicines showed that the patient improved I have reviewed the patients home medicines and have made adjustments as needed   Critical Interventions:  Nebs/steroids   Problem List / ED Course:  RSV:  pt is feeling much better after nebs/steroids.  Pt is able to ambulate without any problems.  O2 sat stays in at 100.  Pt is to return if worse.  F/u with pcp. Hyperglycemia:  pt is told to take her home dm meds.  She is to keep an eye on her sugar while on the steroids.   Reevaluation:  After the interventions noted above, I reevaluated the patient and found that they have :improved   Social Determinants of Health:  Lives at home   Dispostion:  After consideration of the diagnostic results and the patients response to treatment, I feel that the patent would benefit from discharge with outpatient f/u.          Final Clinical Impression(s) / ED Diagnoses Final diagnoses:  RSV (acute bronchiolitis due to respiratory syncytial virus)  COPD exacerbation ([place])  Hyperglycemia due to diabetes mellitus ([place])    Rx / DC Orders ED Discharge Orders          Ordered    predniSONE (DELTASONE) 50 MG tablet  Daily with breakfast        08/22/22 0959    fluticasone (FLONASE) 50 MCG/ACT nasal spray  Daily        08/22/22 0959  Isla Pence, MD 08/22/22 1002

## 2022-08-22 NOTE — Discharge Instructions (Signed)
The steroids will increase your blood sugar.  Keep a close eye on your sugar while on the prednisone.   Return if worse.

## 2022-08-22 NOTE — ED Triage Notes (Signed)
Pt arrived POV, reports SOB and cough x 4 days, states chest feels tight from coughing. Uses neb tx and albuterol inhaler. H/O Asthma and COPD.

## 2022-08-27 ENCOUNTER — Telehealth: Payer: Self-pay

## 2022-08-27 NOTE — Telephone Encounter (Signed)
     Patient  visit on 08/22/2022  at [facility] was for RSV (acute bronchiolitis due to respiratory syncytial virus).  Have you been able to follow up with your primary care physician? Patient has appointment scheduled 08/28/2022.  The patient was or was not able to obtain any needed medicine or equipment. Patient obtained medication.  Are there diet recommendations that you are having difficulty following? No  Patient expresses understanding of discharge instructions and education provided has no other needs at this time.    [place] Resource Care Guide   [email or web address]  ?? [phone]   Website: [email or web address]  [email or web address]

## 2022-08-28 DIAGNOSIS — J205 Acute bronchitis due to respiratory syncytial virus: Secondary | ICD-10-CM | POA: Diagnosis not present

## 2022-08-28 DIAGNOSIS — Z6825 Body mass index (BMI) 25.0-25.9, adult: Secondary | ICD-10-CM | POA: Diagnosis not present

## 2022-08-29 ENCOUNTER — Ambulatory Visit: Payer: Medicare HMO | Admitting: Cardiology

## 2022-09-04 ENCOUNTER — Other Ambulatory Visit: Payer: Self-pay | Admitting: Internal Medicine

## 2022-09-04 DIAGNOSIS — Z1231 Encounter for screening mammogram for malignant neoplasm of breast: Secondary | ICD-10-CM

## 2022-09-18 ENCOUNTER — Ambulatory Visit: Payer: Medicare HMO

## 2022-09-19 DIAGNOSIS — R6889 Other general symptoms and signs: Secondary | ICD-10-CM | POA: Diagnosis not present

## 2022-09-19 DIAGNOSIS — E113313 Type 2 diabetes mellitus with moderate nonproliferative diabetic retinopathy with macular edema, bilateral: Secondary | ICD-10-CM | POA: Diagnosis not present

## 2022-09-19 DIAGNOSIS — Z794 Long term (current) use of insulin: Secondary | ICD-10-CM | POA: Diagnosis not present

## 2022-09-24 ENCOUNTER — Ambulatory Visit: Payer: Medicare HMO | Admitting: Pulmonary Disease

## 2022-09-26 DIAGNOSIS — E113313 Type 2 diabetes mellitus with moderate nonproliferative diabetic retinopathy with macular edema, bilateral: Secondary | ICD-10-CM | POA: Diagnosis not present

## 2022-09-26 DIAGNOSIS — R6889 Other general symptoms and signs: Secondary | ICD-10-CM | POA: Diagnosis not present

## 2022-09-27 ENCOUNTER — Ambulatory Visit: Payer: Medicare HMO | Admitting: Pulmonary Disease

## 2022-09-27 ENCOUNTER — Encounter: Payer: Self-pay | Admitting: Pulmonary Disease

## 2022-09-27 VITALS — BP 116/72 | HR 87 | Ht 67.0 in | Wt 166.6 lb

## 2022-09-27 DIAGNOSIS — J4531 Mild persistent asthma with (acute) exacerbation: Secondary | ICD-10-CM

## 2022-09-27 DIAGNOSIS — G4733 Obstructive sleep apnea (adult) (pediatric): Secondary | ICD-10-CM

## 2022-09-27 NOTE — Assessment & Plan Note (Signed)
Continue Trelegy She has a few disls left over which she will use and then check with insurance if this is covered well.  If not we may have to go back to Symbicort or some other medication that is better covered under insurance We again discussed action plan for asthma exacerbation   She will continue to use albuterol on an as-needed basis In the future if she needs prednisone again,I discussed increasing insulin to adjust for hyperglycemia

## 2022-09-27 NOTE — Progress Notes (Signed)
Subjective:    Patient ID: Amy Cobb, female    DOB: 23-May-1961, 62 y.o.   MRN: [id number]  HPI  62  yo ex-smoker  For FU of asthma and OSA -unable to tolerate CPAP She smoked about a pack per week starting at age 89 until age 64, less than 10 pack years.  She reports that her breathing problem started when she moved from [place] to [place] in 2005.  She reports prednisone tapers 5-6 times in the year and hospitalized at least once or twice every year.    PMH - hospitalized with Covid pneumonia 03/2020, discharged on 4 L oxygen.    She has a delayed sleep phase syndrome with bedtime as late as 2 AM  -insulin requiring diabetes, hypertension requiring 6 medications    ED visit 09/2021 for chest pain, CT angiogram was negative for pulmonary embolism but showed small pericardial effusion. She was treated with NSAIDs for presumed pericarditis.   Chief Complaint  Patient presents with   Follow-up    Pt f/u states that she had RSV in January she seen the PCP prescribed antibiotics ED had given her '50mg'$  prednisone and didn't instructed her to taper down.   Pt reports she is still coughing, chest tightness, her snoring has gotten worse. She was fitted for a mouth pice but she had a lot going on personally and was unable to finish the process   Last OV 02/2022 >>continues to have flares of her asthma. -step up therapy from Symbicort to Trelegy.  She had 3 or 4 disks that were given to her and she started using this.   Limited echo 05/2022 trivial pericardial effusion  Referred for oral appliance to Dr. Ron Parker, she has diagnosed and got fitted however did not go through with this due to $300 deductible and deaths in the family.  Meantime, she had an ED visit on 1/5 for bronchitis symptoms and was diagnosed with RSV, given prednisone 50 mg for 5 days, has increased his sugar to 400 range and she had to increase her insulin.  She saw her PCP and was given doxycycline. She fell 75%  back to baseline however still has some persistent cough and dyspnea.  She reports compliance with Trelegy   Significant tests/ events reviewed 04/2021 lexiscan myoview >> low risk study   PFTs 10/2020 no airway obstruction, nml FEV1, DLCO 74%   09/2020 ONO/RA shows minimal desaturation for 7 minutes less than 88%.>> dc O2   04/2018 NPSG -mild OSA, AHI 9/hour, RDI 30/hour 05/2018 CPAP titration >> 10 cm , medium mask, wt 174 lbs    Review of Systems neg for any significant sore throat, dysphagia, itching, sneezing, nasal congestion or excess/ purulent secretions, fever, chills, sweats, unintended wt loss, pleuritic or exertional cp, hempoptysis, orthopnea pnd or change in chronic leg swelling. Also denies presyncope, palpitations, heartburn, abdominal pain, nausea, vomiting, diarrhea or change in bowel or urinary habits, dysuria,hematuria, rash, arthralgias, visual complaints, headache, numbness weakness or ataxia.     Objective:   Physical Exam   Gen. Pleasant, well-nourished, in no distress ENT - no thrush, no pallor/icterus,no post nasal drip Neck: No JVD, no thyromegaly, no carotid bruits Lungs: no use of accessory muscles, no dullness to percussion, clear without rales or rhonchi  Cardiovascular: Rhythm regular, heart sounds  normal, no murmurs or gallops, no peripheral edema Musculoskeletal: No deformities, no cyanosis or clubbing         Assessment & Plan:

## 2022-09-27 NOTE — Assessment & Plan Note (Signed)
She has been referred for oral appliance and she will pursue this when she is ready to meet the deductible cost. Meantime we will repeat sleep test, she has lost about 10 pounds and would like to reassess severity

## 2022-09-27 NOTE — Patient Instructions (Addendum)
Continue on Trelegy  X home sleep test

## 2022-10-22 DIAGNOSIS — Z6826 Body mass index (BMI) 26.0-26.9, adult: Secondary | ICD-10-CM | POA: Diagnosis not present

## 2022-10-22 DIAGNOSIS — K219 Gastro-esophageal reflux disease without esophagitis: Secondary | ICD-10-CM | POA: Diagnosis not present

## 2022-10-22 DIAGNOSIS — I10 Essential (primary) hypertension: Secondary | ICD-10-CM | POA: Diagnosis not present

## 2022-10-22 DIAGNOSIS — J449 Chronic obstructive pulmonary disease, unspecified: Secondary | ICD-10-CM | POA: Diagnosis not present

## 2022-10-22 DIAGNOSIS — Z0000 Encounter for general adult medical examination without abnormal findings: Secondary | ICD-10-CM | POA: Diagnosis not present

## 2022-10-22 DIAGNOSIS — G4730 Sleep apnea, unspecified: Secondary | ICD-10-CM | POA: Diagnosis not present

## 2022-10-22 DIAGNOSIS — E1143 Type 2 diabetes mellitus with diabetic autonomic (poly)neuropathy: Secondary | ICD-10-CM | POA: Diagnosis not present

## 2022-10-24 ENCOUNTER — Ambulatory Visit: Payer: Medicare HMO

## 2022-10-24 DIAGNOSIS — G4733 Obstructive sleep apnea (adult) (pediatric): Secondary | ICD-10-CM | POA: Diagnosis not present

## 2022-10-26 ENCOUNTER — Telehealth: Payer: Self-pay | Admitting: Pulmonary Disease

## 2022-10-26 DIAGNOSIS — G4733 Obstructive sleep apnea (adult) (pediatric): Secondary | ICD-10-CM | POA: Diagnosis not present

## 2022-10-26 NOTE — Telephone Encounter (Signed)
HST showed very mild OSA with AHI 6/ hr  This is improved compared to her last sleep study in 2019 which showed AHI of 9/hour Okay to continue to focus on weight loss If she is symptomatic she can pursue dental appliance as we discussed during office visit

## 2022-10-29 NOTE — Telephone Encounter (Signed)
Called and spoke w/ pt she verbalized understanding. She verbalized she will continue with her weight loss

## 2022-11-06 ENCOUNTER — Ambulatory Visit
Admission: RE | Admit: 2022-11-06 | Discharge: 2022-11-06 | Disposition: A | Discharge status: Home / Self Care | Payer: Medicare HMO | Source: Ambulatory Visit | Attending: Internal Medicine | Admitting: Internal Medicine

## 2022-11-06 DIAGNOSIS — Z1231 Encounter for screening mammogram for malignant neoplasm of breast: Secondary | ICD-10-CM | POA: Diagnosis not present

## 2022-11-14 DIAGNOSIS — Z124 Encounter for screening for malignant neoplasm of cervix: Secondary | ICD-10-CM | POA: Diagnosis not present

## 2022-11-14 DIAGNOSIS — N951 Menopausal and female climacteric states: Secondary | ICD-10-CM | POA: Diagnosis not present

## 2022-11-14 DIAGNOSIS — Z01419 Encounter for gynecological examination (general) (routine) without abnormal findings: Secondary | ICD-10-CM | POA: Diagnosis not present

## 2022-12-10 ENCOUNTER — Telehealth: Payer: Self-pay | Admitting: Podiatry

## 2022-12-10 NOTE — Telephone Encounter (Signed)
Patient would like to get diabetic shoes, you seen her in December,  do you need to see her again ? There is no orders for diabetic shoes

## 2022-12-24 ENCOUNTER — Telehealth: Payer: Self-pay | Admitting: *Deleted

## 2022-12-24 MED ORDER — TRELEGY ELLIPTA 200-62.5-25 MCG/ACT IN AEPB: 1.0000 | INHALATION_SPRAY | Freq: Every day | RESPIRATORY_TRACT | 6 refills | Status: DC

## 2022-12-24 NOTE — Telephone Encounter (Signed)
Trelegy has been sent to preferred pharmacy. Patient is aware and voiced her understanding.  Nothing further needed.  

## 2022-12-24 NOTE — Telephone Encounter (Signed)
Patient states she is no longer enrolled in the program that provided Trelegy and would like a prescription sent over to [facility] pharmacy.   Please call and advise patient if needed. [phone]

## 2022-12-26 ENCOUNTER — Ambulatory Visit (INDEPENDENT_AMBULATORY_CARE_PROVIDER_SITE_OTHER): Payer: Medicare HMO

## 2022-12-26 DIAGNOSIS — E1165 Type 2 diabetes mellitus with hyperglycemia: Secondary | ICD-10-CM

## 2022-12-26 DIAGNOSIS — M2142 Flat foot [pes planus] (acquired), left foot: Secondary | ICD-10-CM

## 2022-12-26 DIAGNOSIS — M2141 Flat foot [pes planus] (acquired), right foot: Secondary | ICD-10-CM

## 2022-12-26 DIAGNOSIS — Z794 Long term (current) use of insulin: Secondary | ICD-10-CM

## 2022-12-26 NOTE — Progress Notes (Signed)
Patient presents to the office today for diabetic shoe and insole measuring.  ABN signed.   Documentation of medical necessity will be sent to patient's treating diabetic doctor to verify and sign.   Patient's diabetic provider: Toma Deiters NPI: [id number]  Shoes and insoles will be ordered at that time and patient will be notified for an appointment for fitting when they arrive.   Patient shoe selection-   1st   Shoe choice:   [id number]  Shoe size ordered:  21M

## 2022-12-27 DIAGNOSIS — L821 Other seborrheic keratosis: Secondary | ICD-10-CM | POA: Diagnosis not present

## 2022-12-28 ENCOUNTER — Ambulatory Visit: Payer: Medicare HMO | Admitting: Podiatry

## 2022-12-28 DIAGNOSIS — M2042 Other hammer toe(s) (acquired), left foot: Secondary | ICD-10-CM

## 2022-12-28 DIAGNOSIS — E119 Type 2 diabetes mellitus without complications: Secondary | ICD-10-CM | POA: Diagnosis not present

## 2022-12-28 DIAGNOSIS — Z794 Long term (current) use of insulin: Secondary | ICD-10-CM

## 2022-12-28 DIAGNOSIS — M2041 Other hammer toe(s) (acquired), right foot: Secondary | ICD-10-CM

## 2022-12-28 DIAGNOSIS — E1165 Type 2 diabetes mellitus with hyperglycemia: Secondary | ICD-10-CM

## 2022-12-28 NOTE — Progress Notes (Signed)
Subjective: Amy Cobb presents today referred by Toma Deiters, MD for diabetic foot evaluation.  Patient relates 22 year history of diabetes.  Patient denies any history of foot wounds.  Patient has history of numbness, tingling, burning, pins/needles sensations.  Past Medical History:  Diagnosis Date   Acid reflux    Anxiety    Asthma    COPD (chronic obstructive pulmonary disease) (HCC)    Depression    Essential hypertension    OSA on CPAP    Type 2 diabetes mellitus (HCC)    Vulvar cancer (HCC) 2003    Patient Active Problem List   Diagnosis Date Noted   Pain due to onychomycosis of toenails of both feet 11/14/2021   Chronic respiratory failure with hypoxia (HCC) 09/23/2020   Asthma exacerbation, non-allergic, mild persistent 09/23/2020   OSA (obstructive sleep apnea) 09/23/2020   COVID-19 04/16/2020   S/P nasal septoplasty 02/17/2020   Anxiety disorder, unspecified 02/11/2017   Major depressive disorder, recurrent episode (HCC) 02/11/2017   Essential hypertension, benign 01/15/2014   Type 2 diabetes mellitus (HCC) 01/15/2014   Pericardial effusion 01/14/2014    Past Surgical History:  Procedure Laterality Date   ABDOMINAL HYSTERECTOMY     BREAST CYST EXCISION Right    CERVICAL FUSION     CO2 LASER APPLICATION  2003   CO2 LASER VAPORIZATION OF THE VULVAR LESION   NASAL SEPTOPLASTY W/ TURBINOPLASTY N/A 02/17/2020   Procedure: BILATERAL NASAL SEPTOPLASTY;  Surgeon: Newman Pies, MD;  Location: [facility] OR;  Service: ENT;  Laterality: N/A;   SHOULDER SURGERY     TURBINATE REDUCTION Bilateral 02/17/2020   Procedure: BILATERAL TURBINATE REDUCTION;  Surgeon: Newman Pies, MD;  Location: [facility] OR;  Service: ENT;  Laterality: Bilateral;    Current Outpatient Medications on File Prior to Visit  Medication Sig Dispense Refill   Aflibercept (EYLEA) 2 MG/0.05ML SOLN 1 each by Intravitreal route See admin instructions. Every 5 weeks     albuterol (PROVENTIL) (2.5 MG/3ML) 0.083%  nebulizer solution Take 2.5 mg by nebulization every 8 (eight) hours as needed for wheezing or shortness of breath.      albuterol (VENTOLIN HFA) 108 (90 Base) MCG/ACT inhaler Inhale 2 puffs into the lungs every 4 (four) hours as needed for wheezing or shortness of breath. 8 g 2   Alcohol Swabs (B-D SINGLE USE SWABS REGULAR) PADS      chlorthalidone (HYGROTON) 25 MG tablet Take 25 mg by mouth daily.     cloNIDine (CATAPRES) 0.2 MG tablet Take 1 tablet (0.2 mg total) by mouth 3 (three) times daily.     cyanocobalamin (,VITAMIN B-12,) 1000 MCG/ML injection Inject 1,000 mcg as directed every 30 (thirty) days.     DROPLET PEN NEEDLES 32G X 4 MM MISC      famotidine (PEPCID) 20 MG tablet Take 20 mg by mouth at bedtime.     fluticasone (FLONASE) 50 MCG/ACT nasal spray Place 1 spray into both nostrils daily.     fluticasone (FLONASE) 50 MCG/ACT nasal spray Place 1 spray into both nostrils daily. 11.1 mL 2   Fluticasone-Umeclidin-Vilant (TRELEGY ELLIPTA) 200-62.5-25 MCG/ACT AEPB Inhale 1 puff into the lungs daily. 60 each 6   glimepiride (AMARYL) 4 MG tablet      hydrALAZINE (APRESOLINE) 50 MG tablet TAKE 1 TABLET THREE TIMES DAILY 270 tablet 3   insulin glargine (LANTUS) 100 UNIT/ML injection Inject 50 Units into the skin 2 (two) times daily.     insulin lispro (HUMALOG) 100 UNIT/ML KwikPen  Inject 25 Units into the skin 3 (three) times daily.     labetalol (NORMODYNE) 200 MG tablet TAKE 2 TABLETS (400 MG TOTAL) BY MOUTH 2 (TWO) TIMES DAILY. DOSE INCREASE 360 tablet 3   losartan (COZAAR) 100 MG tablet TAKE 1 TABLET EVERY DAY 90 tablet 2   minoxidil (LONITEN) 2.5 MG tablet Take 2.5 mg by mouth daily.     promethazine-dextromethorphan (PROMETHAZINE-DM) 6.25-15 MG/5ML syrup Take 5 mLs by mouth every 6 (six) hours as needed for cough.     No current facility-administered medications on file prior to visit.     Allergies  Allergen Reactions   Augmentin [Amoxicillin-Pot Clavulanate] Anaphylaxis and Other  (See Comments)    Hallucinations.   Penicillins Anaphylaxis and Nausea And Vomiting    Hallucinations.   Clavulanic Acid    Gabapentin     Hallucinate    Moxifloxacin Hives   Novolog [Insulin Aspart] Rash    Social History   Occupational History   Not on file  Tobacco Use   Smoking status: Former    Packs/day: .25    Types: Cigarettes    Quit date: 12/25/2008    Years since quitting: 14.0   Smokeless tobacco: Never   Tobacco comments:    Used to smoke about 3 a day  Vaping Use   Vaping Use: Never used  Substance and Sexual Activity   Alcohol use: Yes    Comment: occaisonally   Drug use: No   Sexual activity: Yes    Partners: Male    Comment: 1st intercourse- 9, partners- (27 female) (1 female),  married- 16 yrs     Family History  Problem Relation Age of Onset   Hypertension Mother    Hypertension Sister    Breast cancer Sister 7   Diabetes Sister    Breast cancer Cousin        several cousins   Diabetes Cousin     Immunization History  Administered Date(s) Administered   [employer] Vaccination 08/23/2020, 09/27/2020    Review of systems: Positive Findings in bold print.  Constitutional:  chills, fatigue, fever, sweats, weight change Communication: Nurse, learning disability, sign Presenter, broadcasting, hand writing, iPad/Android device Head: headaches, head injury Eyes: changes in vision, eye pain, glaucoma, cataracts, macular degeneration, diplopia, glare,  light sensitivity, eyeglasses or contacts, blindness Ears nose mouth throat: hearing impaired, hearing aids,  ringing in ears, deaf, sign language,  vertigo, nosebleeds,  rhinitis,  cold sores, snoring, swollen glands Cardiovascular: HTN, edema, arrhythmia, pacemaker in place, defibrillator in place, chest pain/tightness, chronic anticoagulation, blood clot, heart failure, MI Peripheral Vascular: leg cramps, varicose veins, blood clots, lymphedema, varicosities Respiratory:  asthma, difficulty breathing, denies  congestion, SOB, wheezing, cough, emphysema Gastrointestinal: change in appetite or weight, abdominal pain, constipation, diarrhea, nausea, vomiting, vomiting blood, change in bowel habits, abdominal pain, jaundice, rectal bleeding, hemorrhoids, GERD Genitourinary:  nocturia,  pain on urination, polyuria,  blood in urine, Foley catheter, urinary urgency, ESRD on hemodialysis Musculoskeletal: amputation, cramping, stiff joints, painful joints, decreased joint motion, fractures, OA, gout, hemiplegia, paraplegia, uses cane, wheelchair bound, uses walker, uses rollator Skin: +changes in toenails, color change, dryness, itching, mole changes,  rash, wound(s) Neurological: headaches, numbness in feet, paresthesias in feet, burning in feet, fainting,  seizures, change in speech, migraines, memory problems/poor historian, cerebral palsy, weakness, paralysis, CVA, TIA Endocrine: diabetes, hypothyroidism, hyperthyroidism,  goiter, dry mouth, flushing, heat intolerance, cold intolerance,  excessive thirst, denies polyuria,  nocturia Hematological:  easy bleeding, excessive bleeding, easy bruising, enlarged  lymph nodes, on long term blood thinner, history of past transusions Allergy/immunological:  hives, eczema, frequent infections, multiple drug allergies, seasonal allergies, transplant recipient, multiple food allergies Psychiatric:  anxiety, depression, mood disorder, suicidal ideations, hallucinations, insomnia  Objective: There were no vitals filed for this visit. Vascular Examination: Capillary refill time less than 3 seconds x 10 digits.  Dorsalis pedis pulses palpable 2 out of 4.  Posterior tibial pulses palpable 2 out of 4.  Digital hair not present x 10 digits.  Skin temperature gradient WNL b/l.  Dermatological Examination: Skin with normal turgor, texture and tone b/l  Toenails 1-5 b/l discolored, thick, dystrophic with subungual debris and pain with palpation to nailbeds due to thickness  of nails.  Musculoskeletal: Muscle strength 5/5 to all LE muscle groups.  Neurological: Sensation intact with 10 gram monofilament.  Vibratory sensation intact.  Assessment: NIDDM Encounter for diabetic foot examination Hammertoe 2 through 4 bilateral  Plan: Discussed diabetic foot care principles. Literature dispensed on today. Patient to continue soft, supportive shoe gear daily. Patient to report any pedal injuries to medical professional immediately. Follow up one year. Patient/POA to call should there be a concern in the interim Given the nature of hammertoe deformity patient will benefit from diabetic shoes.  Patient has noted to be casted for diabetic shoes few days ago.

## 2023-01-09 DIAGNOSIS — E113313 Type 2 diabetes mellitus with moderate nonproliferative diabetic retinopathy with macular edema, bilateral: Secondary | ICD-10-CM | POA: Diagnosis not present

## 2023-01-09 DIAGNOSIS — R6889 Other general symptoms and signs: Secondary | ICD-10-CM | POA: Diagnosis not present

## 2023-01-21 DIAGNOSIS — J449 Chronic obstructive pulmonary disease, unspecified: Secondary | ICD-10-CM | POA: Diagnosis not present

## 2023-01-21 DIAGNOSIS — Z6827 Body mass index (BMI) 27.0-27.9, adult: Secondary | ICD-10-CM | POA: Diagnosis not present

## 2023-01-21 DIAGNOSIS — K219 Gastro-esophageal reflux disease without esophagitis: Secondary | ICD-10-CM | POA: Diagnosis not present

## 2023-01-21 DIAGNOSIS — E1143 Type 2 diabetes mellitus with diabetic autonomic (poly)neuropathy: Secondary | ICD-10-CM | POA: Diagnosis not present

## 2023-01-21 DIAGNOSIS — I10 Essential (primary) hypertension: Secondary | ICD-10-CM | POA: Diagnosis not present

## 2023-01-21 DIAGNOSIS — G4730 Sleep apnea, unspecified: Secondary | ICD-10-CM | POA: Diagnosis not present

## 2023-01-21 DIAGNOSIS — Z0000 Encounter for general adult medical examination without abnormal findings: Secondary | ICD-10-CM | POA: Diagnosis not present

## 2023-01-24 ENCOUNTER — Encounter: Payer: Self-pay | Admitting: Cardiology

## 2023-01-24 ENCOUNTER — Ambulatory Visit: Payer: Medicare HMO | Attending: Cardiology | Admitting: Cardiology

## 2023-01-24 VITALS — BP 130/68 | HR 82 | Ht 67.0 in | Wt 176.4 lb

## 2023-01-24 DIAGNOSIS — I10 Essential (primary) hypertension: Secondary | ICD-10-CM

## 2023-01-24 DIAGNOSIS — E782 Mixed hyperlipidemia: Secondary | ICD-10-CM

## 2023-01-24 DIAGNOSIS — R002 Palpitations: Secondary | ICD-10-CM | POA: Diagnosis not present

## 2023-01-24 MED ORDER — ROSUVASTATIN CALCIUM 5 MG PO TABS: 5.0000 mg | ORAL_TABLET | Freq: Every day | ORAL | 3 refills | Status: DC

## 2023-01-24 NOTE — Progress Notes (Signed)
    Cardiology Office Note  Date: 01/24/2023   ID: Amy Cobb, DOB 1961/01/16, MRN [id number]  History of Present Illness: Amy Cobb is a 62 y.o. female last seen in November 2023 by Ms. Strader PA-C, I reviewed the note.  She is here today for a routine visit.  She does not report any exertional chest pain or palpitations, no unusual shortness of breath with typical activities.  She continues to follow with Dr. Olena Leatherwood, I reviewed her recent lab work.  LDL was 99, in the setting of type 2 diabetes mellitus we discussed rationale for statin therapy and more aggressive lipid control.  She does have a history of prior statin myalgias, cannot recall which medication it was as this was several years ago.  We have decided to try low-dose Crestor for now and reevaluate.  I reviewed her antihypertensive medications, she reports compliance with therapy.  Physical Exam: VS:  BP 130/68   Pulse 82   Ht 5\' 7"  (1.702 m)   Wt 176 lb 6.4 oz (80 kg)   SpO2 98%   BMI 27.63 kg/m , BMI Body mass index is 27.63 kg/m.  Wt Readings from Last 3 Encounters:  01/24/23 176 lb 6.4 oz (80 kg)  09/27/22 166 lb 9.6 oz (75.6 kg)  08/22/22 168 lb (76.2 kg)    General: Patient appears comfortable at rest. HEENT: Conjunctiva and lids normal. Neck: Supple, no elevated JVP or carotid bruits. Lungs: Clear to auscultation, nonlabored breathing at rest. Cardiac: Regular rate and rhythm, no S3 or significant systolic murmur. Extremities: No pitting edema.  ECG:  An ECG dated 08/22/2022 was personally reviewed today and demonstrated:  Sinus rhythm with left atrial enlargement, left anterior fascicular block, decreased R wave progression.  Labwork: 08/22/2022: B Natriuretic Peptide 19.0; BUN 16; Creatinine, Ser 0.95; Hemoglobin 15.3; Platelets 306; Potassium 3.5; Sodium 136  June 2024: BUN 20, creatinine 0.89, potassium 4.2, cholesterol 172, triglycerides 102, HDL 54, LDL 99, hemoglobin A1c 7.3%  Other  Studies Reviewed Today:  Echocardiogram 06/11/2022:  1. Left ventricular ejection fraction, by estimation, is 65 to 70%. The  left ventricle has normal function. Left ventricular diastolic parameters  are consistent with Grade I diastolic dysfunction (impaired relaxation).   2. Right ventricular systolic function is normal. The right ventricular  size is normal.   3. There is no evidence of cardiac tamponade.Trivial circumferential  pericardial effusion   4. The inferior vena cava is normal in size with greater than 50%  respiratory variability, suggesting right atrial pressure of 3 mmHg.   5. Limited echo to evaluate pericardial effusion.  Assessment and Plan:  1.  Essential hypertension.  Blood pressure control is adequate today on current regimen including Cozaar, labetalol, hydralazine, clonidine, and chlorthalidone.  Recent lab work shows normal renal function.  2.  History of pericardial effusion.  Follow-up echocardiogram in October 2023 showed only trivial effusion.  3.  Palpitations.  Currently quiescent on labetalol.  4.  Mixed hyperlipidemia in the setting of type 2 diabetes mellitus.  Recent LDL 99.  Plan to trial Crestor 5 mg daily, if tolerated check FLP and LFTs for next visit.  Disposition:  Follow up  6 months.  Signed, Jonelle Sidle, M.D., F.A.C.C. [facility]

## 2023-01-24 NOTE — Patient Instructions (Signed)
Medication Instructions:  START Crestor 5 mg daily at dinner   Labwork: Fasting lipids and lft's in 6 months (DECEMBER)  Testing/Procedures: None today  Follow-Up: 6 months  Any Other Special Instructions Will Be Listed Below (If Applicable).  If you need a refill on your cardiac medications before your next appointment, please call your pharmacy.

## 2023-01-31 ENCOUNTER — Ambulatory Visit (INDEPENDENT_AMBULATORY_CARE_PROVIDER_SITE_OTHER): Payer: Medicare HMO | Admitting: Podiatry

## 2023-01-31 DIAGNOSIS — M2041 Other hammer toe(s) (acquired), right foot: Secondary | ICD-10-CM

## 2023-01-31 DIAGNOSIS — Z794 Long term (current) use of insulin: Secondary | ICD-10-CM

## 2023-01-31 DIAGNOSIS — M2141 Flat foot [pes planus] (acquired), right foot: Secondary | ICD-10-CM

## 2023-01-31 DIAGNOSIS — E1165 Type 2 diabetes mellitus with hyperglycemia: Secondary | ICD-10-CM

## 2023-01-31 DIAGNOSIS — M2042 Other hammer toe(s) (acquired), left foot: Secondary | ICD-10-CM

## 2023-01-31 DIAGNOSIS — M2142 Flat foot [pes planus] (acquired), left foot: Secondary | ICD-10-CM

## 2023-01-31 NOTE — Progress Notes (Signed)
Patient presents today to pick up diabetic shoes and insoles.  Patient was dispensed 1 pair of diabetic shoes and 3 pairs of foam casted diabetic insoles. Fit was satisfactory. Instructions for break-in and wear was reviewed and a copy was given to the patient.   Re-appointment for regularly scheduled diabetic foot care visits or if they should experience any trouble with the shoes or insoles.  

## 2023-02-11 ENCOUNTER — Other Ambulatory Visit: Payer: Self-pay | Admitting: Cardiology

## 2023-02-11 ENCOUNTER — Other Ambulatory Visit: Payer: Self-pay | Admitting: Internal Medicine

## 2023-02-11 DIAGNOSIS — N644 Mastodynia: Secondary | ICD-10-CM

## 2023-02-12 ENCOUNTER — Ambulatory Visit
Admission: RE | Admit: 2023-02-12 | Discharge: 2023-02-12 | Disposition: A | Discharge status: Home / Self Care | Payer: Medicare HMO | Source: Ambulatory Visit | Attending: Internal Medicine | Admitting: Internal Medicine

## 2023-02-12 DIAGNOSIS — N644 Mastodynia: Secondary | ICD-10-CM | POA: Diagnosis not present

## 2023-03-02 ENCOUNTER — Other Ambulatory Visit: Payer: Self-pay

## 2023-03-02 ENCOUNTER — Emergency Department (HOSPITAL_COMMUNITY): Payer: Medicare HMO

## 2023-03-02 ENCOUNTER — Encounter (HOSPITAL_COMMUNITY): Payer: Self-pay

## 2023-03-02 ENCOUNTER — Emergency Department (HOSPITAL_COMMUNITY)
Admission: EM | Admit: 2023-03-02 | Discharge: 2023-03-02 | Disposition: A | Discharge status: Home / Self Care | Payer: Medicare HMO | Attending: Emergency Medicine | Admitting: Emergency Medicine

## 2023-03-02 DIAGNOSIS — Z7984 Long term (current) use of oral hypoglycemic drugs: Secondary | ICD-10-CM | POA: Diagnosis not present

## 2023-03-02 DIAGNOSIS — Z794 Long term (current) use of insulin: Secondary | ICD-10-CM | POA: Diagnosis not present

## 2023-03-02 DIAGNOSIS — Z7951 Long term (current) use of inhaled steroids: Secondary | ICD-10-CM | POA: Diagnosis not present

## 2023-03-02 DIAGNOSIS — J441 Chronic obstructive pulmonary disease with (acute) exacerbation: Secondary | ICD-10-CM | POA: Diagnosis not present

## 2023-03-02 DIAGNOSIS — E119 Type 2 diabetes mellitus without complications: Secondary | ICD-10-CM | POA: Diagnosis not present

## 2023-03-02 DIAGNOSIS — R0602 Shortness of breath: Secondary | ICD-10-CM | POA: Diagnosis not present

## 2023-03-02 LAB — CBC WITH DIFFERENTIAL/PLATELET
Abs Immature Granulocytes: 0.03 10*3/uL (ref 0.00–0.07)
Basophils Absolute: 0.1 10*3/uL (ref 0.0–0.1)
Basophils Relative: 1 %
Eosinophils Absolute: 0.1 10*3/uL (ref 0.0–0.5)
Eosinophils Relative: 2 %
HCT: 38.8 % (ref 36.0–46.0)
Hemoglobin: 12.8 g/dL (ref 12.0–15.0)
Immature Granulocytes: 1 %
Lymphocytes Relative: 33 %
Lymphs Abs: 2.2 10*3/uL (ref 0.7–4.0)
MCH: 26.2 pg (ref 26.0–34.0)
MCHC: 33 g/dL (ref 30.0–36.0)
MCV: 79.5 fL — ABNORMAL LOW (ref 80.0–100.0)
Monocytes Absolute: 0.7 10*3/uL (ref 0.1–1.0)
Monocytes Relative: 11 %
Neutro Abs: 3.5 10*3/uL (ref 1.7–7.7)
Neutrophils Relative %: 52 %
Platelets: 325 10*3/uL (ref 150–400)
RBC: 4.88 MIL/uL (ref 3.87–5.11)
RDW: 14.4 % (ref 11.5–15.5)
WBC: 6.6 10*3/uL (ref 4.0–10.5)
nRBC: 0 % (ref 0.0–0.2)

## 2023-03-02 LAB — COMPREHENSIVE METABOLIC PANEL
ALT: 19 U/L (ref 0–44)
AST: 23 U/L (ref 15–41)
Albumin: 3.5 g/dL (ref 3.5–5.0)
Alkaline Phosphatase: 79 U/L (ref 38–126)
Anion gap: 8 (ref 5–15)
BUN: 18 mg/dL (ref 6–20)
CO2: 31 mmol/L (ref 22–32)
Calcium: 9.2 mg/dL (ref 8.9–10.3)
Chloride: 99 mmol/L (ref 98–111)
Creatinine, Ser: 0.87 mg/dL (ref 0.44–1.00)
GFR, Estimated: >60 mL/min (ref >60)
Glucose, Bld: 198 mg/dL — ABNORMAL HIGH (ref 70–99)
Potassium: 3.4 mmol/L — ABNORMAL LOW (ref 3.5–5.1)
Sodium: 138 mmol/L (ref 135–145)
Total Bilirubin: 0.4 mg/dL (ref 0.3–1.2)
Total Protein: 6.7 g/dL (ref 6.5–8.1)

## 2023-03-02 MED ORDER — PREDNISONE 10 MG PO TABS: 20.0000 mg | ORAL_TABLET | Freq: Every day | ORAL | 0 refills | Status: DC

## 2023-03-02 MED ORDER — MAGNESIUM SULFATE 2 GM/50ML IV SOLN
2.0000 g | Freq: Once | INTRAVENOUS | Status: AC
  Administered 2023-03-02: 2 g via INTRAVENOUS
  Filled 2023-03-02: qty 50

## 2023-03-02 MED ORDER — ALBUTEROL SULFATE (2.5 MG/3ML) 0.083% IN NEBU
2.5000 mg | INHALATION_SOLUTION | Freq: Once | RESPIRATORY_TRACT | Status: AC
  Administered 2023-03-02: 2.5 mg via RESPIRATORY_TRACT
  Filled 2023-03-02: qty 3

## 2023-03-02 MED ORDER — DOXYCYCLINE HYCLATE 100 MG PO CAPS: 100.0000 mg | ORAL_CAPSULE | Freq: Two times a day (BID) | ORAL | 0 refills | Status: DC

## 2023-03-02 MED ORDER — IPRATROPIUM-ALBUTEROL 0.5-2.5 (3) MG/3ML IN SOLN
3.0000 mL | Freq: Once | RESPIRATORY_TRACT | Status: AC
  Administered 2023-03-02: 3 mL via RESPIRATORY_TRACT
  Filled 2023-03-02: qty 3

## 2023-03-02 MED ORDER — SODIUM CHLORIDE 0.9 % IV BOLUS
500.0000 mL | Freq: Once | INTRAVENOUS | Status: AC
  Administered 2023-03-02: 500 mL via INTRAVENOUS

## 2023-03-02 MED ORDER — METHYLPREDNISOLONE SODIUM SUCC 125 MG IJ SOLR
125.0000 mg | Freq: Once | INTRAMUSCULAR | Status: AC
  Administered 2023-03-02: 125 mg via INTRAVENOUS
  Filled 2023-03-02: qty 2

## 2023-03-02 MED ORDER — BENZONATATE 100 MG PO CAPS: 100.0000 mg | ORAL_CAPSULE | Freq: Three times a day (TID) | ORAL | 0 refills | Status: AC | PRN

## 2023-03-02 NOTE — ED Triage Notes (Signed)
Pt states she has been SOB for about a week and has been using her rescue inhaler and feels its not helping anymore. Pt states she does have a dx of COPD and asthma.

## 2023-03-02 NOTE — ED Provider Notes (Signed)
[place] EMERGENCY DEPARTMENT AT [facility] Provider Note   CSN: [id number] Arrival date & time: 03/02/23  1009     History  Chief Complaint  Patient presents with   Shortness of Breath    Amy Cobb is a 62 y.o. female.  Patient complains of shortness of breath.  She has a history of COPD.  She has had no fever  The history is provided by the patient and medical records. No language interpreter was used.  Shortness of Breath Severity:  Moderate Onset quality:  Gradual Timing:  Constant Progression:  Worsening Chronicity:  Recurrent Context: activity   Relieved by:  Nothing Worsened by:  Nothing Ineffective treatments:  None tried Associated symptoms: no abdominal pain, no chest pain, no cough, no headaches and no rash        Home Medications Prior to Admission medications   Medication Sig Start Date End Date Taking? Authorizing Provider  benzonatate (TESSALON) 100 MG capsule Take 1 capsule (100 mg total) by mouth 3 (three) times daily as needed for cough. 03/02/23  Yes Bethann Berkshire, MD  doxycycline (VIBRAMYCIN) 100 MG capsule Take 1 capsule (100 mg total) by mouth 2 (two) times daily. One po bid x 7 days 03/02/23  Yes Bethann Berkshire, MD  predniSONE (DELTASONE) 10 MG tablet Take 2 tablets (20 mg total) by mouth daily. 03/02/23  Yes Bethann Berkshire, MD  Accu-Chek FastClix Lancets MISC Apply topically. 12/11/22   [provider]  Aflibercept (EYLEA) 2 MG/0.05ML SOLN 1 each by Intravitreal route See admin instructions. Every 5 weeks    [provider]  albuterol (PROVENTIL) (2.5 MG/3ML) 0.083% nebulizer solution Take 2.5 mg by nebulization every 8 (eight) hours as needed for wheezing or shortness of breath.     [provider]  albuterol (VENTOLIN HFA) 108 (90 Base) MCG/ACT inhaler Inhale 2 puffs into the lungs every 4 (four) hours as needed for wheezing or shortness of breath. 04/07/21   Oretha Milch, MD  Alcohol Swabs (B-D SINGLE  USE SWABS REGULAR) PADS  03/07/20   [provider]  Blood Glucose Monitoring Suppl (ACCU-CHEK GUIDE ME) w/Device KIT  09/18/22   [provider]  chlorthalidone (HYGROTON) 25 MG tablet Take 25 mg by mouth daily.    [provider]  cloNIDine (CATAPRES) 0.2 MG tablet Take 1 tablet (0.2 mg total) by mouth 3 (three) times daily. 01/21/18   Strader, Lennart Pall, PA-C  cyanocobalamin (,VITAMIN B-12,) 1000 MCG/ML injection Inject 1,000 mcg as directed every 30 (thirty) days.    [provider]  DROPLET PEN NEEDLES 32G X 4 MM MISC  08/24/20   [provider]  famotidine (PEPCID) 20 MG tablet Take 20 mg by mouth at bedtime.    [provider]  fexofenadine (ALLEGRA) 180 MG tablet as needed. 08/28/22   [provider]  fluticasone (FLONASE) 50 MCG/ACT nasal spray Place 1 spray into both nostrils daily.    [provider]  fluticasone (FLONASE) 50 MCG/ACT nasal spray Place 1 spray into both nostrils daily. 08/22/22   Jacalyn Lefevre, MD  Fluticasone-Umeclidin-Vilant (TRELEGY ELLIPTA) 200-62.5-25 MCG/ACT AEPB Inhale 1 puff into the lungs daily. Patient not taking: Reported on 01/24/2023 12/24/22   Oretha Milch, MD  glimepiride (AMARYL) 4 MG tablet  08/15/20   [provider]  hydrALAZINE (APRESOLINE) 50 MG tablet TAKE 1 TABLET THREE TIMES DAILY 04/24/22   Jonelle Sidle, MD  insulin glargine (LANTUS) 100 UNIT/ML injection Inject 50 Units into  the skin 2 (two) times daily.    [provider]  insulin lispro (HUMALOG) 100 UNIT/ML KwikPen Inject 25 Units into the skin 3 (three) times daily. Patient not taking: Reported on 01/24/2023    [provider]  labetalol (NORMODYNE) 200 MG tablet TAKE 2 TABLETS TWICE DAILY 02/11/23   Jonelle Sidle, MD  losartan (COZAAR) 100 MG tablet TAKE 1 TABLET EVERY DAY 02/11/23   Jonelle Sidle, MD  minoxidil (LONITEN) 2.5 MG tablet Take 2.5 mg by mouth daily.    [provider]   promethazine-dextromethorphan (PROMETHAZINE-DM) 6.25-15 MG/5ML syrup Take 5 mLs by mouth every 6 (six) hours as needed for cough. 08/22/22   [provider]  rosuvastatin (CRESTOR) 5 MG tablet Take 1 tablet (5 mg total) by mouth daily. 01/24/23 04/24/23  Jonelle Sidle, MD      Allergies    Augmentin [amoxicillin-pot clavulanate], Penicillins, Clavulanic acid, Gabapentin, Moxifloxacin, and Novolog [insulin aspart]    Review of Systems   Review of Systems  Constitutional:  Negative for appetite change and fatigue.  HENT:  Negative for congestion, ear discharge and sinus pressure.   Eyes:  Negative for discharge.  Respiratory:  Positive for shortness of breath. Negative for cough.   Cardiovascular:  Negative for chest pain.  Gastrointestinal:  Negative for abdominal pain and diarrhea.  Genitourinary:  Negative for frequency and hematuria.  Musculoskeletal:  Negative for back pain.  Skin:  Negative for rash.  Neurological:  Negative for seizures and headaches.  Psychiatric/Behavioral:  Negative for hallucinations.     Physical Exam Updated Vital Signs BP (!) 156/76 (BP Location: Left Arm)   Pulse 80   Temp 98 F (36.7 C) (Oral)   Resp 20   Ht 5\' 7"  (1.702 m)   Wt 77.1 kg   SpO2 99%   BMI 26.63 kg/m  Physical Exam Vitals and nursing note reviewed.  Constitutional:      Appearance: She is well-developed.  HENT:     Head: Normocephalic.     Nose: Nose normal.  Eyes:     General: No scleral icterus.    Conjunctiva/sclera: Conjunctivae normal.  Neck:     Thyroid: No thyromegaly.  Cardiovascular:     Rate and Rhythm: Normal rate and regular rhythm.     Heart sounds: No murmur heard.    No friction rub. No gallop.  Pulmonary:     Breath sounds: No stridor. Wheezing present. No rales.  Chest:     Chest wall: No tenderness.  Abdominal:     General: There is no distension.     Tenderness: There is no abdominal tenderness. There is no rebound.  Musculoskeletal:         General: Normal range of motion.     Cervical back: Neck supple.  Lymphadenopathy:     Cervical: No cervical adenopathy.  Skin:    Findings: No erythema or rash.  Neurological:     Mental Status: She is alert and oriented to person, place, and time.     Motor: No abnormal muscle tone.     Coordination: Coordination normal.  Psychiatric:        Behavior: Behavior normal.     ED Results / Procedures / Treatments   Labs (all labs ordered are listed, but only abnormal results are displayed) Labs Reviewed  CBC WITH DIFFERENTIAL/PLATELET - Abnormal; Notable for the following components:      Result Value   MCV 79.5 (*)    All other  components within normal limits  COMPREHENSIVE METABOLIC PANEL - Abnormal; Notable for the following components:   Potassium 3.4 (*)    Glucose, Bld 198 (*)    All other components within normal limits    EKG None  Radiology DG Chest Port 1 View  Result Date: 03/02/2023 CLINICAL DATA:  Shortness of breath EXAM: PORTABLE CHEST 1 VIEW COMPARISON:  Previous studies including the examination of 08/22/2022 FINDINGS: The heart size and mediastinal contours are within normal limits. Both lungs are clear. The visualized skeletal structures are unremarkable. IMPRESSION: No active disease. Electronically Signed   By: Ernie Avena M.D.   On: 03/02/2023 11:21    Procedures Procedures    Medications Ordered in ED Medications  methylPREDNISolone sodium succinate (SOLU-MEDROL) 125 mg/2 mL injection 125 mg (125 mg Intravenous Given 03/02/23 1049)  ipratropium-albuterol (DUONEB) 0.5-2.5 (3) MG/3ML nebulizer solution 3 mL (3 mLs Nebulization Given 03/02/23 1115)  magnesium sulfate IVPB 2 g 50 mL (0 g Intravenous Stopped 03/02/23 1151)  albuterol (PROVENTIL) (2.5 MG/3ML) 0.083% nebulizer solution 2.5 mg (2.5 mg Nebulization Given 03/02/23 1115)  sodium chloride 0.9 % bolus 500 mL (0 mLs Intravenous Stopped 03/02/23 1151)  ipratropium-albuterol (DUONEB)  0.5-2.5 (3) MG/3ML nebulizer solution 3 mL (3 mLs Nebulization Given 03/02/23 1503)  albuterol (PROVENTIL) (2.5 MG/3ML) 0.083% nebulizer solution 2.5 mg (2.5 mg Nebulization Given 03/02/23 1503)    ED Course/ Medical Decision Making/ A&P  This patient presents to the ED for concern of shortness of breath, this involves an extensive number of treatment options, and is a complaint that carries with it a high risk of complications and morbidity.  The differential diagnosis includes PE, pneumonia, COPD exacerbation   Co morbidities that complicate the patient evaluation  COPD   Additional history obtained:  Additional history obtained from patient External records from outside source obtained and reviewed including hospital records   Lab Tests:  I Ordered, and personally interpreted labs.  The pertinent results include: Glucose 198   Imaging Studies ordered:  I ordered imaging studies including chest x-ray I independently visualized and interpreted imaging which showed negative I agree with the radiologist interpretation   Cardiac Monitoring: / EKG:  The patient was maintained on a cardiac monitor.  I personally viewed and interpreted the cardiac monitored which showed an underlying rhythm of: Normal sinus rhythm   Consultations Obtained:  No consultant  Problem List / ED Course / Critical interventions / Medication management  COPD and diabetes I ordered medication including albuterol and Atrovent Reevaluation of the patient after these medicines showed that the patient improved I have reviewed the patients home medicines and have made adjustments as needed   Social Determinants of Health:  None   Test / Admission - Considered:  None                            Medical Decision Making Amount and/or Complexity of Data Reviewed Labs: ordered. Radiology: ordered.  Risk Prescription drug management.  COPD exacerbation.  Patient put on prednisone, Tessalon  Perles and doxycycline        Final Clinical Impression(s) / ED Diagnoses Final diagnoses:  COPD exacerbation (HCC)    Rx / DC Orders ED Discharge Orders          Ordered    predniSONE (DELTASONE) 10 MG tablet  Daily        03/02/23 1554    benzonatate (TESSALON) 100 MG capsule  3 times daily  PRN        03/02/23 1554    doxycycline (VIBRAMYCIN) 100 MG capsule  2 times daily        03/02/23 1554              Bethann Berkshire, MD 03/04/23 1641

## 2023-03-02 NOTE — Discharge Instructions (Signed)
Follow-up with your doctor this week for recheck.  Return if any problem 

## 2023-03-08 ENCOUNTER — Telehealth: Payer: Self-pay

## 2023-03-08 NOTE — Telephone Encounter (Signed)
Transition Care Management Follow-up Telephone Call Date of discharge and from where: 03/02/2023 [facility] How have you been since you were released from the hospital? Patient is beginning to feel better not quite back to normal. Any questions or concerns? No  Items Reviewed: Did the pt receive and understand the discharge instructions provided? Yes  Medications obtained and verified? Yes  Other? No  Any new allergies since your discharge? No  Dietary orders reviewed? Yes Do you have support at home? Yes   Follow up appointments reviewed:  PCP Hospital f/u appt confirmed? No  Scheduled to see  on  @ . Specialist Hospital f/u appt confirmed? No  Scheduled to see  on  @ . Are transportation arrangements needed? No  If their condition worsens, is the pt aware to call PCP or go to the Emergency Dept.? Yes Was the patient provided with contact information for the PCP's office or ED? Yes Was to pt encouraged to call back with questions or concerns? Yes  Yarisbel Miranda [facility]  [facility] Population Health Community Resource Care Guide   [email or web address]  ?? [phone]   Website: [email or web address]  [email or web address]

## 2023-03-11 DIAGNOSIS — Z6827 Body mass index (BMI) 27.0-27.9, adult: Secondary | ICD-10-CM | POA: Diagnosis not present

## 2023-03-11 DIAGNOSIS — J449 Chronic obstructive pulmonary disease, unspecified: Secondary | ICD-10-CM | POA: Diagnosis not present

## 2023-03-11 DIAGNOSIS — I10 Essential (primary) hypertension: Secondary | ICD-10-CM | POA: Diagnosis not present

## 2023-03-21 DIAGNOSIS — E113313 Type 2 diabetes mellitus with moderate nonproliferative diabetic retinopathy with macular edema, bilateral: Secondary | ICD-10-CM | POA: Diagnosis not present

## 2023-03-21 DIAGNOSIS — R6889 Other general symptoms and signs: Secondary | ICD-10-CM | POA: Diagnosis not present

## 2023-03-21 DIAGNOSIS — Z794 Long term (current) use of insulin: Secondary | ICD-10-CM | POA: Diagnosis not present

## 2023-04-26 ENCOUNTER — Ambulatory Visit: Payer: Medicare HMO | Admitting: Pulmonary Disease

## 2023-05-01 DIAGNOSIS — E113313 Type 2 diabetes mellitus with moderate nonproliferative diabetic retinopathy with macular edema, bilateral: Secondary | ICD-10-CM | POA: Diagnosis not present

## 2023-05-01 DIAGNOSIS — R6889 Other general symptoms and signs: Secondary | ICD-10-CM | POA: Diagnosis not present

## 2023-05-10 ENCOUNTER — Other Ambulatory Visit: Payer: Self-pay | Admitting: Cardiology

## 2023-05-15 DIAGNOSIS — J449 Chronic obstructive pulmonary disease, unspecified: Secondary | ICD-10-CM | POA: Diagnosis not present

## 2023-05-15 DIAGNOSIS — E1143 Type 2 diabetes mellitus with diabetic autonomic (poly)neuropathy: Secondary | ICD-10-CM | POA: Diagnosis not present

## 2023-05-15 DIAGNOSIS — N182 Chronic kidney disease, stage 2 (mild): Secondary | ICD-10-CM | POA: Diagnosis not present

## 2023-05-15 DIAGNOSIS — K219 Gastro-esophageal reflux disease without esophagitis: Secondary | ICD-10-CM | POA: Diagnosis not present

## 2023-05-15 DIAGNOSIS — I10 Essential (primary) hypertension: Secondary | ICD-10-CM | POA: Diagnosis not present

## 2023-05-15 DIAGNOSIS — Z6828 Body mass index (BMI) 28.0-28.9, adult: Secondary | ICD-10-CM | POA: Diagnosis not present

## 2023-05-15 DIAGNOSIS — G4730 Sleep apnea, unspecified: Secondary | ICD-10-CM | POA: Diagnosis not present

## 2023-06-12 DIAGNOSIS — Q998 Other specified chromosome abnormalities: Secondary | ICD-10-CM | POA: Diagnosis not present

## 2023-06-20 ENCOUNTER — Ambulatory Visit (HOSPITAL_BASED_OUTPATIENT_CLINIC_OR_DEPARTMENT_OTHER): Payer: Medicare HMO | Admitting: Pulmonary Disease

## 2023-06-20 ENCOUNTER — Encounter (HOSPITAL_BASED_OUTPATIENT_CLINIC_OR_DEPARTMENT_OTHER): Payer: Self-pay | Admitting: Pulmonary Disease

## 2023-06-20 VITALS — BP 132/68 | HR 88 | Resp 16 | Ht 67.0 in | Wt 180.5 lb

## 2023-06-20 DIAGNOSIS — G4733 Obstructive sleep apnea (adult) (pediatric): Secondary | ICD-10-CM | POA: Diagnosis not present

## 2023-06-20 DIAGNOSIS — J4531 Mild persistent asthma with (acute) exacerbation: Secondary | ICD-10-CM | POA: Diagnosis not present

## 2023-06-20 MED ORDER — TRELEGY ELLIPTA 100-62.5-25 MCG/ACT IN AEPB: 1.0000 | INHALATION_SPRAY | Freq: Every day | RESPIRATORY_TRACT | Status: DC

## 2023-06-20 NOTE — Progress Notes (Signed)
Subjective:    Patient ID: Amy Cobb, female    DOB: 28-Dec-1960, 63 y.o.   MRN: [id number]  HPI  62  yo ex-smoker  For FU of asthma and OSA -unable to tolerate CPAP She smoked about a pack per week starting at age 57 until age 44, less than 10 pack years.  She reports that her breathing problem started when she moved from [place] to [place] in 2005.  She reports prednisone tapers 5-6 times in the year and hospitalized at least once or twice every year.  OV 02/2022 >>continues to have flares of her asthma. -step up therapy from Symbicort to Trelegy   PMH - hospitalized with Covid pneumonia 03/2020, discharged on 4 L oxygen.    She has a delayed sleep phase syndrome with bedtime as late as 2 AM  -insulin requiring diabetes, hypertension requiring 6 medications    ED visit 09/2021 for chest pain, CT angiogram was negative for pulmonary embolism but showed small pericardial effusion. She was treated with NSAIDs for presumed pericarditis.   30-month follow-up visit She has difficult to control hypertension requiring multiple medications.  We reviewed home sleep study results which only showed mild sleep apnea, she has currently not on any therapy.  She cannot afford Trelegy due to high cost and is back on Symbicort. She will of URI symptoms and chest tightness and was present given Z-Pak and prednisone by her PCP   Significant tests/ events reviewed 04/2021 lexiscan myoview >> low risk study   PFTs 10/2020 no airway obstruction, nml FEV1, DLCO 74%   09/2020 ONO/RA shows minimal desaturation for 7 minutes less than 88%.>> dc O2  10/2022 HST  very mild OSA with AHI 6/ hr   04/2018 NPSG -mild OSA, AHI 9/hour, RDI 30/hour 05/2018 CPAP titration >> 10 cm , medium mask, wt 174 lbs    Review of Systems neg for any significant sore throat, dysphagia, itching, sneezing, nasal congestion or excess/ purulent secretions, fever, chills, sweats, unintended wt loss, pleuritic or exertional  cp, hempoptysis, orthopnea pnd or change in chronic leg swelling. Also denies presyncope, palpitations, heartburn, abdominal pain, nausea, vomiting, diarrhea or change in bowel or urinary habits, dysuria,hematuria, rash, arthralgias, visual complaints, headache, numbness weakness or ataxia.     Objective:   Physical Exam  Gen. Pleasant, obese, in no distress ENT - no lesions, no post nasal drip Neck: No JVD, no thyromegaly, no carotid bruits Lungs: no use of accessory muscles, no dullness to percussion, decreased without rales or rhonchi  Cardiovascular: Rhythm regular, heart sounds  normal, no murmurs or gallops, no peripheral edema Musculoskeletal: No deformities, no cyanosis or clubbing , no tremors       Assessment & Plan:

## 2023-06-20 NOTE — Addendum Note (Signed)
Addended by: Jama Flavors on: 06/20/2023 11:34 AM   Modules accepted: Orders

## 2023-06-20 NOTE — Assessment & Plan Note (Signed)
Appears very mild and okay to stay off therapy. Do not feel that this is contributing to her refractory hypertension

## 2023-06-20 NOTE — Assessment & Plan Note (Signed)
Complete course of Z-Pak and prednisone that she has been currently prescribed. We will provide her with a sample of Trelegy. Since she is not able to afford Trelegy long-term, can switch back to Symbicort 160-2 puffs twice daily

## 2023-06-20 NOTE — Patient Instructions (Signed)
x sample of Trelegy.  Complete Z-Pak and prednisone  Okay to use Symbicort 2 puffs twice daily if you are not able to obtain Trelegy

## 2023-07-05 ENCOUNTER — Other Ambulatory Visit: Payer: Self-pay

## 2023-07-05 ENCOUNTER — Emergency Department (HOSPITAL_COMMUNITY)
Admission: EM | Admit: 2023-07-05 | Discharge: 2023-07-05 | Disposition: A | Discharge status: Home / Self Care | Payer: Medicare HMO | Attending: Emergency Medicine | Admitting: Emergency Medicine

## 2023-07-05 ENCOUNTER — Emergency Department (HOSPITAL_COMMUNITY): Payer: Medicare HMO

## 2023-07-05 ENCOUNTER — Encounter (HOSPITAL_COMMUNITY): Payer: Self-pay

## 2023-07-05 DIAGNOSIS — Z1152 Encounter for screening for COVID-19: Secondary | ICD-10-CM | POA: Insufficient documentation

## 2023-07-05 DIAGNOSIS — J189 Pneumonia, unspecified organism: Secondary | ICD-10-CM

## 2023-07-05 DIAGNOSIS — R0602 Shortness of breath: Secondary | ICD-10-CM | POA: Diagnosis not present

## 2023-07-05 DIAGNOSIS — R918 Other nonspecific abnormal finding of lung field: Secondary | ICD-10-CM | POA: Diagnosis not present

## 2023-07-05 DIAGNOSIS — E119 Type 2 diabetes mellitus without complications: Secondary | ICD-10-CM | POA: Insufficient documentation

## 2023-07-05 DIAGNOSIS — Z79899 Other long term (current) drug therapy: Secondary | ICD-10-CM | POA: Diagnosis not present

## 2023-07-05 DIAGNOSIS — J168 Pneumonia due to other specified infectious organisms: Secondary | ICD-10-CM | POA: Diagnosis not present

## 2023-07-05 DIAGNOSIS — Z87891 Personal history of nicotine dependence: Secondary | ICD-10-CM | POA: Diagnosis not present

## 2023-07-05 DIAGNOSIS — Z7951 Long term (current) use of inhaled steroids: Secondary | ICD-10-CM | POA: Insufficient documentation

## 2023-07-05 DIAGNOSIS — R7989 Other specified abnormal findings of blood chemistry: Secondary | ICD-10-CM | POA: Diagnosis not present

## 2023-07-05 DIAGNOSIS — J45909 Unspecified asthma, uncomplicated: Secondary | ICD-10-CM | POA: Insufficient documentation

## 2023-07-05 DIAGNOSIS — Z794 Long term (current) use of insulin: Secondary | ICD-10-CM | POA: Diagnosis not present

## 2023-07-05 DIAGNOSIS — J181 Lobar pneumonia, unspecified organism: Secondary | ICD-10-CM | POA: Insufficient documentation

## 2023-07-05 DIAGNOSIS — I10 Essential (primary) hypertension: Secondary | ICD-10-CM | POA: Insufficient documentation

## 2023-07-05 DIAGNOSIS — R000 Tachycardia, unspecified: Secondary | ICD-10-CM | POA: Insufficient documentation

## 2023-07-05 DIAGNOSIS — Z7984 Long term (current) use of oral hypoglycemic drugs: Secondary | ICD-10-CM | POA: Insufficient documentation

## 2023-07-05 LAB — CBC WITH DIFFERENTIAL/PLATELET
Abs Immature Granulocytes: 0.03 10*3/uL (ref 0.00–0.07)
Basophils Absolute: 0.1 10*3/uL (ref 0.0–0.1)
Basophils Relative: 1 %
Eosinophils Absolute: 0.2 10*3/uL (ref 0.0–0.5)
Eosinophils Relative: 2 %
HCT: 41.5 % (ref 36.0–46.0)
Hemoglobin: 13.7 g/dL (ref 12.0–15.0)
Immature Granulocytes: 0 %
Lymphocytes Relative: 24 %
Lymphs Abs: 1.9 10*3/uL (ref 0.7–4.0)
MCH: 26.7 pg (ref 26.0–34.0)
MCHC: 33 g/dL (ref 30.0–36.0)
MCV: 80.7 fL (ref 80.0–100.0)
Monocytes Absolute: 1 10*3/uL (ref 0.1–1.0)
Monocytes Relative: 12 %
Neutro Abs: 4.9 10*3/uL (ref 1.7–7.7)
Neutrophils Relative %: 61 %
Platelets: 331 10*3/uL (ref 150–400)
RBC: 5.14 MIL/uL — ABNORMAL HIGH (ref 3.87–5.11)
RDW: 13.4 % (ref 11.5–15.5)
WBC: 8 10*3/uL (ref 4.0–10.5)
nRBC: 0 % (ref 0.0–0.2)

## 2023-07-05 LAB — COMPREHENSIVE METABOLIC PANEL
ALT: 18 U/L (ref 0–44)
AST: 21 U/L (ref 15–41)
Albumin: 3.6 g/dL (ref 3.5–5.0)
Alkaline Phosphatase: 62 U/L (ref 38–126)
Anion gap: 11 (ref 5–15)
BUN: 15 mg/dL (ref 6–20)
CO2: 31 mmol/L (ref 22–32)
Calcium: 9.3 mg/dL (ref 8.9–10.3)
Chloride: 97 mmol/L — ABNORMAL LOW (ref 98–111)
Creatinine, Ser: 1.07 mg/dL — ABNORMAL HIGH (ref 0.44–1.00)
GFR, Estimated: 60 mL/min — ABNORMAL LOW (ref >60)
Glucose, Bld: 210 mg/dL — ABNORMAL HIGH (ref 70–99)
Potassium: 4.3 mmol/L (ref 3.5–5.1)
Sodium: 139 mmol/L (ref 135–145)
Total Bilirubin: 0.5 mg/dL (ref <1.2)
Total Protein: 7.4 g/dL (ref 6.5–8.1)

## 2023-07-05 LAB — RESP PANEL BY RT-PCR (RSV, FLU A&B, COVID)  RVPGX2
Influenza A by PCR: NEGATIVE
Influenza B by PCR: NEGATIVE
Resp Syncytial Virus by PCR: NEGATIVE
SARS Coronavirus 2 by RT PCR: NEGATIVE

## 2023-07-05 LAB — TROPONIN I (HIGH SENSITIVITY)
Troponin I (High Sensitivity): 12 ng/L (ref <18)
Troponin I (High Sensitivity): 11 ng/L (ref <18)

## 2023-07-05 LAB — D-DIMER, QUANTITATIVE: D-Dimer, Quant: 0.58 ug{FEU}/mL — ABNORMAL HIGH (ref 0.00–0.50)

## 2023-07-05 MED ORDER — DOXYCYCLINE HYCLATE 100 MG PO CAPS: 100.0000 mg | ORAL_CAPSULE | Freq: Two times a day (BID) | ORAL | 0 refills | Status: AC

## 2023-07-05 MED ORDER — IPRATROPIUM-ALBUTEROL 0.5-2.5 (3) MG/3ML IN SOLN
3.0000 mL | Freq: Once | RESPIRATORY_TRACT | Status: AC
  Administered 2023-07-05: 3 mL via RESPIRATORY_TRACT
  Filled 2023-07-05: qty 3

## 2023-07-05 MED ORDER — DOXYCYCLINE HYCLATE 100 MG PO TABS
100.0000 mg | ORAL_TABLET | Freq: Once | ORAL | Status: AC
  Administered 2023-07-05: 100 mg via ORAL
  Filled 2023-07-05: qty 1

## 2023-07-05 MED ORDER — ALBUTEROL SULFATE (2.5 MG/3ML) 0.083% IN NEBU
INHALATION_SOLUTION | RESPIRATORY_TRACT | Status: AC
  Filled 2023-07-05: qty 6

## 2023-07-05 MED ORDER — ALBUTEROL SULFATE (2.5 MG/3ML) 0.083% IN NEBU
2.5000 mg | INHALATION_SOLUTION | Freq: Once | RESPIRATORY_TRACT | Status: AC
  Administered 2023-07-05: 2.5 mg via RESPIRATORY_TRACT
  Filled 2023-07-05: qty 3

## 2023-07-05 MED ORDER — PREDNISONE 20 MG PO TABS
40.0000 mg | ORAL_TABLET | Freq: Once | ORAL | Status: AC
  Administered 2023-07-05: 40 mg via ORAL
  Filled 2023-07-05: qty 2

## 2023-07-05 MED ORDER — BENZONATATE 100 MG PO CAPS
100.0000 mg | ORAL_CAPSULE | Freq: Once | ORAL | Status: AC
  Administered 2023-07-05: 100 mg via ORAL
  Filled 2023-07-05: qty 1

## 2023-07-05 MED ORDER — PREDNISONE 20 MG PO TABS: 40.0000 mg | ORAL_TABLET | Freq: Every day | ORAL | 0 refills | Status: AC

## 2023-07-05 MED ORDER — ALBUTEROL SULFATE (2.5 MG/3ML) 0.083% IN NEBU
5.0000 mg | INHALATION_SOLUTION | Freq: Once | RESPIRATORY_TRACT | Status: AC
  Administered 2023-07-05: 5 mg via RESPIRATORY_TRACT
  Filled 2023-07-05: qty 6

## 2023-07-05 MED ORDER — IPRATROPIUM BROMIDE 0.02 % IN SOLN
0.5000 mg | Freq: Once | RESPIRATORY_TRACT | Status: AC
  Administered 2023-07-05: 0.5 mg via RESPIRATORY_TRACT
  Filled 2023-07-05: qty 2.5

## 2023-07-05 NOTE — ED Notes (Signed)
Pt ambulated around nurses station with no difficulty or distress noted. Sats remained 98% on RA.

## 2023-07-05 NOTE — ED Provider Notes (Cosign Needed Addendum)
[place] EMERGENCY DEPARTMENT AT [facility] Provider Note   CSN: [id number] Arrival date & time: 07/05/23  1236     History  Chief Complaint  Patient presents with   Shortness of Breath    Amy Cobb is a 62 y.o. female.  History of DM, hypertension, sleep apnea, asthma, sleep apnea.  Presents ER for cough, wheezing and chest pain with cough only as well as shortness of breath.  He was going on for over a week, she states she just finished a 7-day steroid taper several days ago and states she was starting to feel better but started feeling worse again this morning, cough is productive of clear sputum.  No fevers, no pleurisy, no nausea or vomiting.  No sick contacts.  She is not a smoker, reports she quit 13 years ago, she is not on home oxygen   Shortness of Breath Associated symptoms: chest pain, cough and wheezing   Associated symptoms: no abdominal pain and no vomiting        Home Medications Prior to Admission medications   Medication Sig Start Date End Date Taking? Authorizing Provider  Accu-Chek FastClix Lancets MISC Apply topically. 12/11/22   [provider]  Aflibercept (EYLEA) 2 MG/0.05ML SOLN 1 each by Intravitreal route See admin instructions. Every 5 weeks    [provider]  albuterol (PROVENTIL) (2.5 MG/3ML) 0.083% nebulizer solution Take 2.5 mg by nebulization every 8 (eight) hours as needed for wheezing or shortness of breath.     [provider]  albuterol (VENTOLIN HFA) 108 (90 Base) MCG/ACT inhaler Inhale 2 puffs into the lungs every 4 (four) hours as needed for wheezing or shortness of breath. 04/07/21   Oretha Milch, MD  Alcohol Swabs (B-D SINGLE USE SWABS REGULAR) PADS  03/07/20   [provider]  benzonatate (TESSALON) 100 MG capsule Take 1 capsule (100 mg total) by mouth 3 (three) times daily as needed for cough. 03/02/23   Bethann Berkshire, MD  Blood Glucose Monitoring Suppl (ACCU-CHEK GUIDE ME) w/Device  KIT  09/18/22   [provider]  chlorthalidone (HYGROTON) 25 MG tablet Take 25 mg by mouth daily.    [provider]  cloNIDine (CATAPRES) 0.2 MG tablet Take 1 tablet (0.2 mg total) by mouth 3 (three) times daily. 01/21/18   Strader, Lennart Pall, PA-C  cyanocobalamin (,VITAMIN B-12,) 1000 MCG/ML injection Inject 1,000 mcg as directed every 30 (thirty) days.    [provider]  DROPLET PEN NEEDLES 32G X 4 MM MISC  08/24/20   [provider]  famotidine (PEPCID) 20 MG tablet Take 20 mg by mouth at bedtime.    [provider]  fexofenadine (ALLEGRA) 180 MG tablet as needed. 08/28/22   [provider]  fluticasone (FLONASE) 50 MCG/ACT nasal spray Place 1 spray into both nostrils daily.    [provider]  fluticasone (FLONASE) 50 MCG/ACT nasal spray Place 1 spray into both nostrils daily. 08/22/22   Jacalyn Lefevre, MD  Fluticasone-Umeclidin-Vilant (TRELEGY ELLIPTA) 100-62.5-25 MCG/ACT AEPB Inhale 1 puff into the lungs daily. 06/20/23   Oretha Milch, MD  Fluticasone-Umeclidin-Vilant (TRELEGY ELLIPTA) 200-62.5-25 MCG/ACT AEPB Inhale 1 puff into the lungs daily. 12/24/22   Oretha Milch, MD  glimepiride (AMARYL) 4 MG tablet  08/15/20   [provider]  hydrALAZINE (APRESOLINE) 50 MG tablet TAKE 1 TABLET THREE TIMES DAILY 05/10/23   Jonelle Sidle, MD  insulin glargine (LANTUS) 100 UNIT/ML injection Inject 50 Units into the  skin 2 (two) times daily.    [provider]  insulin lispro (HUMALOG) 100 UNIT/ML KwikPen Inject 25 Units into the skin 3 (three) times daily.    [provider]  labetalol (NORMODYNE) 200 MG tablet TAKE 2 TABLETS TWICE DAILY 02/11/23   Jonelle Sidle, MD  losartan (COZAAR) 100 MG tablet TAKE 1 TABLET EVERY DAY 02/11/23   Jonelle Sidle, MD  methylPREDNISolone (MEDROL DOSEPAK) 4 MG TBPK tablet Take by mouth as directed. 06/12/23   [provider]  minoxidil (LONITEN) 2.5 MG tablet Take  2.5 mg by mouth daily.    [provider]  predniSONE (DELTASONE) 10 MG tablet Take 2 tablets (20 mg total) by mouth daily. 03/02/23   Bethann Berkshire, MD  promethazine-dextromethorphan (PROMETHAZINE-DM) 6.25-15 MG/5ML syrup Take 5 mLs by mouth every 6 (six) hours as needed for cough. 08/22/22   [provider]  rosuvastatin (CRESTOR) 5 MG tablet Take 1 tablet (5 mg total) by mouth daily. 01/24/23 04/24/23  Jonelle Sidle, MD      Allergies    Augmentin [amoxicillin-pot clavulanate], Penicillins, Clavulanic acid, Gabapentin, Moxifloxacin, and Novolog [insulin aspart (human analog)]    Review of Systems   Review of Systems  Constitutional:  Negative for chills and fatigue.  Respiratory:  Positive for cough, shortness of breath and wheezing.   Cardiovascular:  Positive for chest pain. Negative for palpitations and leg swelling.  Gastrointestinal:  Negative for abdominal pain, diarrhea, nausea and vomiting.  Genitourinary:  Negative for dysuria.    Physical Exam Updated Vital Signs BP (!) 180/76   Pulse 98   Temp 99.1 F (37.3 C) (Oral)   Resp 13   Ht 5\' 7"  (1.702 m)   Wt 81.9 kg   SpO2 95%   BMI 28.27 kg/m  Physical Exam Vitals and nursing note reviewed.  Constitutional:      General: She is not in acute distress.    Appearance: She is well-developed.  HENT:     Head: Normocephalic and atraumatic.  Eyes:     Extraocular Movements: Extraocular movements intact.     Conjunctiva/sclera: Conjunctivae normal.     Pupils: Pupils are equal, round, and reactive to light.  Cardiovascular:     Rate and Rhythm: Regular rhythm. Tachycardia present.     Heart sounds: No murmur heard. Pulmonary:     Effort: Pulmonary effort is normal. No respiratory distress.     Breath sounds: Normal breath sounds.  Chest:     Chest wall: No mass or tenderness.  Abdominal:     Palpations: Abdomen is soft.     Tenderness: There is no abdominal tenderness.  Musculoskeletal:         General: No swelling. Normal range of motion.     Cervical back: Neck supple.  Skin:    General: Skin is warm and dry.     Capillary Refill: Capillary refill takes less than 2 seconds.  Neurological:     General: No focal deficit present.     Mental Status: She is alert and oriented to person, place, and time.  Psychiatric:        Mood and Affect: Mood normal.     ED Results / Procedures / Treatments   Labs (all labs ordered are listed, but only abnormal results are displayed) Labs Reviewed  CBC WITH DIFFERENTIAL/PLATELET - Abnormal; Notable for the following components:      Result Value   RBC 5.14 (*)    All other components within  normal limits  COMPREHENSIVE METABOLIC PANEL - Abnormal; Notable for the following components:   Chloride 97 (*)    Glucose, Bld 210 (*)    Creatinine, Ser 1.07 (*)    GFR, Estimated 60 (*)    All other components within normal limits  D-DIMER, QUANTITATIVE - Abnormal; Notable for the following components:   D-Dimer, Quant 0.58 (*)    All other components within normal limits  RESP PANEL BY RT-PCR (RSV, FLU A&B, COVID)  [id number]  TROPONIN I (HIGH SENSITIVITY)  TROPONIN I (HIGH SENSITIVITY)    EKG EKG Interpretation Date/Time:  Friday July 05 2023 12:54:29 EST Ventricular Rate:  107 PR Interval:  156 QRS Duration:  74 QT Interval:  348 QTC Calculation: 464 R Axis:   -60  Text Interpretation: Sinus tachycardia Left axis deviation Low voltage QRS Confirmed by Gerhard Munch [phone]) on 07/05/2023 2:57:28 PM  Radiology DG Chest 2 View  Result Date: 07/05/2023 CLINICAL DATA:  Shortness of Breath EXAM: CHEST - 2 VIEW COMPARISON:  CXR 08/22/22 FINDINGS: No pleural effusion. No pneumothorax. Hazy opacity at the left lung base could represent atelectasis or infection. Normal cardiac and mediastinal contours. No radiographically apparent displaced rib fractures. Visualized upper abdomen is unremarkable. Vertebral body are maintained. IMPRESSION:  Hazy opacity at the left lung base could represent atelectasis or infection Electronically Signed   By: Lorenza Cambridge M.D.   On: 07/05/2023 16:38    Procedures Procedures    Medications Ordered in ED Medications  albuterol (PROVENTIL) (2.5 MG/3ML) 0.083% nebulizer solution 5 mg (5 mg Nebulization Given 07/05/23 1331)  ipratropium (ATROVENT) nebulizer solution 0.5 mg (0.5 mg Nebulization Given 07/05/23 1331)  benzonatate (TESSALON) capsule 100 mg (100 mg Oral Given 07/05/23 1544)  predniSONE (DELTASONE) tablet 40 mg (40 mg Oral Given 07/05/23 1544)  ipratropium-albuterol (DUONEB) 0.5-2.5 (3) MG/3ML nebulizer solution 3 mL (3 mLs Nebulization Given 07/05/23 1726)  doxycycline (VIBRA-TABS) tablet 100 mg (100 mg Oral Given 07/05/23 1710)  albuterol (PROVENTIL) (2.5 MG/3ML) 0.083% nebulizer solution 2.5 mg (2.5 mg Nebulization Given 07/05/23 1725)    ED Course/ Medical Decision Making/ A&P Clinical Course as of 07/05/23 1930  Fri Jul 05, 2023  4064 Is a 62 year old female with history of COPD, not on home oxygen presents to ER with cough and shortness of breath.  This started over a week ago.  She was treated with prednisone and just finished a 7-day taper 3 days ago but states her shortness of breath, wheezing and cough got worse this morning.  She is having pain in her chest only with cough.  Denies pleuritic pain, no leg pain or swelling.  D-dimer negative for age adjustment.  She got albuterol and ipratropium with relief of her wheezing and shortness of breath, she is not having increased work of breathing on my exam.  Troponin pending, chest x-ray read pending, EKG does not show any ischemic changes [CB]    Clinical Course User Index [CB] Ma Rings, PA-C                                 Medical Decision Making This patient presents to the ED for concern of cough and shortness of breath, this involves an extensive number of treatment options, and is a complaint that carries with it  a high risk of complications and morbidity.  The differential diagnosis includes COPD, asthma exacerbation, pneumonia, PE, ACS, other   Co morbidities that  complicate the patient evaluation  Asthma   Additional history obtained:  Additional history obtained from EMR External records from outside source obtained and reviewed including prior pulmonology notes, most recent note from 10/31   Lab Tests:  I Ordered, and personally interpreted labs.  The pertinent results include: D-dimer negative, troponin delta negative, D-dimer normal for age adjustment, creatinine slightly elevated from baseline at 1.07, over the past year has been between 0.87 and 0.95   Imaging Studies ordered:  I ordered imaging studies including chest x-ray I independently visualized and interpreted imaging which showed left lower lobe opacity I agree with the radiologist interpretation     Problem List / ED Course / Critical interventions / Medication management  Cough, shortness of breath, wheezing-patient has pneumonia on chest x-ray, no fever or leukocytosis but patient was tachycardic on arrival, this resolved after multiple nebulizers and she is having some mild improvement still feels slightly short of breath but ACS ruled out.  Symptoms likely due to her pneumonia.  She has history of asthma, follows with pulmonology.  PCP recently put her on steroids for similar symptoms, when she stopped the steroids symptoms came back and were worse this morning.  D-dimer was negative for age adjustment ruling out pulmonary embolism.  She is low risk Wells criteria.  Patient being treated with doxycycline for her pneumonia, would normally have added Augmentin as well but patient has anaphylaxis to amoxicillin unfortunately.  She ambulated on pulse ox at 98% was not having any worsening of her symptoms.  She was noted to be hypertensive here that this is likely because she had missed 2 doses of her clonidine today due to  being in the ER.  She did have any chest pain, numbness, tingling, or weakness or severe headache or other symptoms to suggest hypertensive emergency.  Patient was advised to continue with her nebulizers at home, will give steroid burst as she had a 7-day taper to finish several days ago.  She is instructed to follow-up with her pulmonologist on Monday.  She was given strict return precautions.  I ordered medication including albuterol, ipratropium, prednisone for wheezing, shortness of breath Reevaluation of the patient after these medicines showed that the patient improved I have reviewed the patients home medicines and have made adjustments as needed      Amount and/or Complexity of Data Reviewed Radiology: ordered.  Risk Prescription drug management.           Final Clinical Impression(s) / ED Diagnoses Final diagnoses:  Pneumonia of left lower lobe due to infectious organism    Rx / DC Orders ED Discharge Orders     None         Ma Rings, PA-C 07/05/23 1930    Josem Kaufmann 07/05/23 1943    Eber Hong, MD 07/06/23 1136

## 2023-07-05 NOTE — ED Notes (Signed)
Ambulated patient with pulse oximeter and pt stayed at 98% most of the way around the work station and ended at 97%. Here pulse was 110-114.

## 2023-07-05 NOTE — ED Provider Triage Note (Signed)
Emergency Medicine Provider Triage Evaluation Note  Amy Cobb , a 62 y.o. female  was evaluated in triage.  Pt complains of shortness of breath and cough.  Symptoms x 2 weeks.  Seen by PCP and started on a prednisone taper pack.  Completed 3 days ago without any relief.  States she is using her albuterol nebulizer at home without improvement.  Also having some generalized chest pain that she feels is associated with coughing.  Concerned that she has a COPD exacerbation.  History of same.  Denies known fever  Review of Systems  Positive: Cough, chest pain, shortness of breath Negative: Fever, vomiting, abdominal pain  Physical Exam  BP (!) 167/76 (BP Location: Left Arm)   Pulse (!) 112   Temp 100 F (37.8 C) (Oral)   Resp 20   Ht 5\' 7"  (1.702 m)   Wt 81.9 kg   SpO2 97%   BMI 28.27 kg/m  Gen:   Awake, no distress   Resp:  Diminished lung sounds bilaterally, increased work of breathing  MSK:   Moves extremities without difficulty  Other:    Medical Decision Making  Medically screening exam initiated at 1:03 PM.  Appropriate orders placed.  Amy Cobb was informed that the remainder of the evaluation will be completed by another provider, this initial triage assessment does not replace that evaluation, and the importance of remaining in the ED until their evaluation is complete.     Pauline Aus, PA-C 07/05/23 1305

## 2023-07-05 NOTE — ED Notes (Signed)
Patient back from CT.

## 2023-07-05 NOTE — Discharge Instructions (Signed)
Was a pleasure taking care of you today.  You were seen in the ER for cough and shortness of breath.  Your chest x-ray showed pneumonia.  We are going to treat you with antibiotics and steroids since you are having some wheezing as well.  Continue your Trelegy inhaler and albuterol nebulizers at home.  Call your lung doctor on Monday for close follow-up.  If you have fever, increased shortness of breath, chest pain or other worrisome changes come back to the ER right away.  Blood pressure was elevated in the ER today.  Make sure you take your medications at home and follow-up with your primary care doctor.

## 2023-07-05 NOTE — ED Triage Notes (Signed)
Pt c/o shortness of breath for the past two weeks that is getting worse. Pt states she just finished a z-pack on Tuesday but is not getting any better. Pt having central CP from coughing.

## 2023-07-09 ENCOUNTER — Telehealth: Payer: Self-pay | Admitting: Pulmonary Disease

## 2023-07-09 NOTE — Telephone Encounter (Signed)
Forwarding to Dr Derry Skill

## 2023-07-09 NOTE — Telephone Encounter (Signed)
Patient went to the ER on November 15th and was diagnosed with pneumonia and was advised to notify her pulmonologist.

## 2023-07-10 DIAGNOSIS — Z6829 Body mass index (BMI) 29.0-29.9, adult: Secondary | ICD-10-CM | POA: Diagnosis not present

## 2023-07-10 DIAGNOSIS — J158 Pneumonia due to other specified bacteria: Secondary | ICD-10-CM | POA: Diagnosis not present

## 2023-07-10 DIAGNOSIS — I10 Essential (primary) hypertension: Secondary | ICD-10-CM | POA: Diagnosis not present

## 2023-07-11 NOTE — Telephone Encounter (Signed)
RA wanting patient seen in 3-4 weeks for CXR review. As of 11/21, everyone is booked. Are we able to double book with any of the apps?

## 2023-07-11 NOTE — Telephone Encounter (Signed)
Can double book on 12/16 or 12/17 on my schedule

## 2023-07-17 NOTE — Telephone Encounter (Signed)
Fredric Mare- PT states either day will work to double book and that she likes after lunch. Please call to advise PT of appt date/time. Thanks.

## 2023-07-17 NOTE — Telephone Encounter (Signed)
Patient scheduled for 12/17 with TP. Cancellation occurred, so no overbook required. Nothing further needed.

## 2023-07-29 DIAGNOSIS — Z6829 Body mass index (BMI) 29.0-29.9, adult: Secondary | ICD-10-CM | POA: Diagnosis not present

## 2023-07-29 DIAGNOSIS — I10 Essential (primary) hypertension: Secondary | ICD-10-CM | POA: Diagnosis not present

## 2023-07-29 DIAGNOSIS — I5031 Acute diastolic (congestive) heart failure: Secondary | ICD-10-CM | POA: Diagnosis not present

## 2023-07-30 ENCOUNTER — Ambulatory Visit: Payer: Medicare HMO | Admitting: Cardiology

## 2023-08-06 ENCOUNTER — Encounter: Payer: Self-pay | Admitting: Adult Health

## 2023-08-06 ENCOUNTER — Ambulatory Visit: Payer: Medicare HMO | Admitting: Adult Health

## 2023-08-06 ENCOUNTER — Ambulatory Visit (INDEPENDENT_AMBULATORY_CARE_PROVIDER_SITE_OTHER): Payer: Medicare HMO

## 2023-08-06 VITALS — BP 124/62 | HR 81 | Temp 98.6°F | Ht 67.0 in | Wt 185.2 lb

## 2023-08-06 DIAGNOSIS — R0602 Shortness of breath: Secondary | ICD-10-CM | POA: Diagnosis not present

## 2023-08-06 DIAGNOSIS — J4531 Mild persistent asthma with (acute) exacerbation: Secondary | ICD-10-CM

## 2023-08-06 DIAGNOSIS — R918 Other nonspecific abnormal finding of lung field: Secondary | ICD-10-CM | POA: Diagnosis not present

## 2023-08-06 LAB — BASIC METABOLIC PANEL
BUN: 10 mg/dL (ref 6–23)
CO2: 31 meq/L (ref 19–32)
Calcium: 9.2 mg/dL (ref 8.4–10.5)
Chloride: 101 meq/L (ref 96–112)
Creatinine, Ser: 0.82 mg/dL (ref 0.40–1.20)
GFR: 77.96 mL/min (ref >60.00)
Glucose, Bld: 104 mg/dL — ABNORMAL HIGH (ref 70–99)
Potassium: 3.2 meq/L — ABNORMAL LOW (ref 3.5–5.1)
Sodium: 138 meq/L (ref 135–145)

## 2023-08-06 LAB — BRAIN NATRIURETIC PEPTIDE: Pro B Natriuretic peptide (BNP): 53 pg/mL (ref 0.0–100.0)

## 2023-08-06 MED ORDER — TRELEGY ELLIPTA 200-62.5-25 MCG/ACT IN AEPB: 1.0000 | INHALATION_SPRAY | Freq: Every day | RESPIRATORY_TRACT | 4 refills | Status: DC

## 2023-08-06 NOTE — Progress Notes (Signed)
@Patient  ID: Amy Cobb, female    DOB: [birth year], 62 y.o.   MRN: [id number]  Chief Complaint  Patient presents with   Follow-up   Discussed the use of AI scribe software for clinical note transcription with the patient, who gave verbal consent to proceed.   Referring provider: Toma Deiters, MD  HPI: 62 year old female former smoker followed for Asthma and obstructive sleep apnea (CPAP intolerant) Medical history significant for DM   TEST/EVENTS :  PMH - hospitalized with Covid pneumonia 03/2020, discharged on 4 L oxygen. + she is not much better than when Dr. Vassie Loll saw her she still having asthma symptoms all the time having used her nebulizer  04/2021 lexiscan myoview >> low risk study   PFTs 10/2020 no airway obstruction, nml FEV1, DLCO 74%   09/2020 ONO/RA shows minimal desaturation for 7 minutes less than 88%.>> dc O2   10/2022 HST  very mild OSA with AHI 6/ hr   04/2018 NPSG -mild OSA, AHI 9/hour, RDI 30/hour 05/2018 CPAP titration >> 10 cm , medium mask, wt 174 lbs 2D echo October 2023 showed EF at 65 to 70%, grade 1 diastolic dysfunction, trivial pericardial effusion   08/06/2023 Follow up : Pneumonia , ER follow up  Patient presents for a emergency room follow-up.  Patient was recently seen in the emergency room last month for acute cough, shortness of breath and wheezing that continued despite outpatient treatment with a Z-Pak and prednisone.   Chest x-ray showed a left lower lobe opacity.  Patient was treated for left sided community-acquired pneumonia.  Was treated with 7-day course of doxycycline.  Prednisone burst for 1 week.  Patient says she is feeling better but continues to feel very weak with low energy.  Patient does have asthma and is on Trelegy daily.  Says that she ran out of Trelegy a couple days ago and cannot afford to pay for it this month as it is is expensive as she is in the medication gap.  We did discuss patient assistance.  Paperwork was  started. Does complain of bilateral leg swelling, which has been present for about a week and a half. This is a new symptom for them, and they have been started on Lasix twice a week by their primary care provider. They have not noticed any improvement in the swelling since starting the Lasix.  They have a history of diastolic heart failure mild on echo.  History of hypertension and diabetes.   They deny any recent travel, calf pain, or history of blood clots.  No hemoptysis.  No unintentional weight loss.  O2 saturations today in the office are 100% on room air.        Allergies  Allergen Reactions   Augmentin [Amoxicillin-Pot Clavulanate] Anaphylaxis and Other (See Comments)    Hallucinations.   Penicillins Anaphylaxis and Nausea And Vomiting    Hallucinations.   Clavulanic Acid    Gabapentin     Hallucinate    Moxifloxacin Hives   Novolog [Insulin Aspart (Human Analog)] Rash    Immunization History  Administered Date(s) Administered   Moderna Sars-Covid-2 Vaccination 08/23/2020, 09/27/2020    Past Medical History:  Diagnosis Date   Acid reflux    Anxiety    Asthma    COPD (chronic obstructive pulmonary disease) (HCC)    Depression    Essential hypertension    OSA on CPAP    Type 2 diabetes mellitus (HCC)    Vulvar cancer (HCC) 2003  Tobacco History: Social History   Tobacco Use  Smoking Status Former   Current packs/day: 0.00   Types: Cigarettes   Quit date: 12/25/2008   Years since quitting: 14.6  Smokeless Tobacco Never  Tobacco Comments   Used to smoke about 3 a day   Counseling given: Not Answered Tobacco comments: Used to smoke about 3 a day   Outpatient Medications Prior to Visit  Medication Sig Dispense Refill   Accu-Chek FastClix Lancets MISC Apply topically.     Aflibercept (EYLEA) 2 MG/0.05ML SOLN 1 each by Intravitreal route See admin instructions. Every 5 weeks     albuterol (PROVENTIL) (2.5 MG/3ML) 0.083% nebulizer solution Take 2.5 mg by  nebulization every 8 (eight) hours as needed for wheezing or shortness of breath.      albuterol (VENTOLIN HFA) 108 (90 Base) MCG/ACT inhaler Inhale 2 puffs into the lungs every 4 (four) hours as needed for wheezing or shortness of breath. 8 g 2   Alcohol Swabs (B-D SINGLE USE SWABS REGULAR) PADS      benzonatate (TESSALON) 100 MG capsule Take 1 capsule (100 mg total) by mouth 3 (three) times daily as needed for cough. 21 capsule 0   Blood Glucose Monitoring Suppl (ACCU-CHEK GUIDE ME) w/Device KIT      chlorthalidone (HYGROTON) 25 MG tablet Take 25 mg by mouth daily.     cloNIDine (CATAPRES) 0.2 MG tablet Take 1 tablet (0.2 mg total) by mouth 3 (three) times daily.     cyanocobalamin (,VITAMIN B-12,) 1000 MCG/ML injection Inject 1,000 mcg as directed every 30 (thirty) days.     DROPLET PEN NEEDLES 32G X 4 MM MISC      famotidine (PEPCID) 20 MG tablet Take 20 mg by mouth at bedtime.     fexofenadine (ALLEGRA) 180 MG tablet as needed.     fluticasone (FLONASE) 50 MCG/ACT nasal spray Place 1 spray into both nostrils daily.     fluticasone (FLONASE) 50 MCG/ACT nasal spray Place 1 spray into both nostrils daily. 11.1 mL 2   Fluticasone-Umeclidin-Vilant (TRELEGY ELLIPTA) 200-62.5-25 MCG/ACT AEPB Inhale 1 puff into the lungs daily. 60 each 6   glimepiride (AMARYL) 4 MG tablet      hydrALAZINE (APRESOLINE) 50 MG tablet TAKE 1 TABLET THREE TIMES DAILY 270 tablet 3   insulin glargine (LANTUS) 100 UNIT/ML injection Inject 50 Units into the skin 2 (two) times daily.     insulin lispro (HUMALOG) 100 UNIT/ML KwikPen Inject 25 Units into the skin 3 (three) times daily.     labetalol (NORMODYNE) 200 MG tablet TAKE 2 TABLETS TWICE DAILY 360 tablet 3   losartan (COZAAR) 100 MG tablet TAKE 1 TABLET EVERY DAY 90 tablet 3   methylPREDNISolone (MEDROL DOSEPAK) 4 MG TBPK tablet Take by mouth as directed.     minoxidil (LONITEN) 2.5 MG tablet Take 2.5 mg by mouth daily.     promethazine-dextromethorphan  (PROMETHAZINE-DM) 6.25-15 MG/5ML syrup Take 5 mLs by mouth every 6 (six) hours as needed for cough.     rosuvastatin (CRESTOR) 5 MG tablet Take 1 tablet (5 mg total) by mouth daily. 90 tablet 3   Fluticasone-Umeclidin-Vilant (TRELEGY ELLIPTA) 100-62.5-25 MCG/ACT AEPB Inhale 1 puff into the lungs daily. (Patient not taking: Reported on 08/06/2023)     No facility-administered medications prior to visit.     Review of Systems:   Constitutional:   No  weight loss, night sweats,  Fevers, chills, +fatigue, or  lassitude.  HEENT:   No headaches,  Difficulty  swallowing,  Tooth/dental problems, or  Sore throat,                No sneezing, itching, ear ache,+nasal congestion, post nasal drip,   CV:  No chest pain,  Orthopnea, PND,  anasarca, dizziness, palpitations, syncope.   GI  No heartburn, indigestion, abdominal pain, nausea, vomiting, diarrhea, change in bowel habits, loss of appetite, bloody stools.   Resp: .  No chest wall deformity  Skin: no rash or lesions.  GU: no dysuria, change in color of urine, no urgency or frequency.  No flank pain, no hematuria   MS:  No joint pain or swelling.  No decreased range of motion.  No back pain.    Physical Exam  BP 124/62 (BP Location: Left Arm, Patient Position: Sitting, Cuff Size: Normal)   Pulse 81   Temp 98.6 F (37 C) (Oral)   Ht 5\' 7"  (1.702 m)   Wt 185 lb 3.2 oz (84 kg)   SpO2 100%   BMI 29.01 kg/m   GEN: A/Ox3; pleasant , NAD, well nourished    HEENT:  [place]/AT,  NOSE-clear, THROAT-clear, no lesions, no postnasal drip or exudate noted.   NECK:  Supple w/ fair ROM; no JVD; normal carotid impulses w/o bruits; no thyromegaly or nodules palpated; no lymphadenopathy.    RESP  Clear  P & A; w/o, wheezes/ rales/ or rhonchi. no accessory muscle use, no dullness to percussion  CARD:  RRR, no m/r/g, 1+ peripheral edema, pulses intact, no cyanosis or clubbing. Neg homans sign   GI:   Soft & nt; nml bowel sounds; no organomegaly or  masses detected.   Musco: Warm bil, no deformities or joint swelling noted.   Neuro: alert, no focal deficits noted.    Skin: Warm, no lesions or rashes    Lab Results:  CBC    Component Value Date/Time   WBC 8.0 07/05/2023 1318   RBC 5.14 (H) 07/05/2023 1318   HGB 13.7 07/05/2023 1318   HCT 41.5 07/05/2023 1318   PLT 331 07/05/2023 1318   MCV 80.7 07/05/2023 1318   MCH 26.7 07/05/2023 1318   MCHC 33.0 07/05/2023 1318   RDW 13.4 07/05/2023 1318   LYMPHSABS 1.9 07/05/2023 1318   MONOABS 1.0 07/05/2023 1318   EOSABS 0.2 07/05/2023 1318   BASOSABS 0.1 07/05/2023 1318    BMET    Component Value Date/Time   NA 139 07/05/2023 1318   K 4.3 07/05/2023 1318   CL 97 (L) 07/05/2023 1318   CO2 31 07/05/2023 1318   GLUCOSE 210 (H) 07/05/2023 1318   BUN 15 07/05/2023 1318   CREATININE 1.07 (H) 07/05/2023 1318   CALCIUM 9.3 07/05/2023 1318   GFRNONAA 60 (L) 07/05/2023 1318   GFRAA >60 02/12/2020 1422    BNP    Component Value Date/Time   BNP 19.0 08/22/2022 0750    ProBNP No results found for: "PROBNP"  Imaging: DG Chest 2 View Result Date: 08/06/2023 CLINICAL DATA:  Hazy opacity at the left base on prior imaging. EXAM: CHEST - 2 VIEW COMPARISON:  07/05/2023 FINDINGS: Streaky opacity at the left base suggest atelectasis or scarring, similar to prior. Right lung clear. The cardiopericardial silhouette is within normal limits for size. No acute bony abnormality. IMPRESSION: Streaky opacity at the left base suggests atelectasis or scarring, similar to prior. Electronically Signed   By: Kennith Center M.D.   On: 08/06/2023 15:42    Administration History     None  Latest Ref Rng & Units 04/03/2022   10:01 AM 11/08/2020   10:04 AM  PFT Results  FVC-Pre L 2.90  2.99   FVC-Predicted Pre % 77  96   FVC-Post L 3.01  3.12   FVC-Predicted Post % 80  101   Pre FEV1/FVC % % 85  85   Post FEV1/FCV % % 80  83   FEV1-Pre L 2.45  2.55   FEV1-Predicted Pre % 84  104    FEV1-Post L 2.42  2.59   DLCO uncorrected ml/min/mmHg 18.65  16.77   DLCO UNC% % 82  74   DLVA Predicted % 102  86   TLC L 4.83  5.15   TLC % Predicted % 87  93   RV % Predicted % 83  87     No results found for: "NITRICOXIDE"      Assessment & Plan:   Assessment and Plan    Pneumonia-LLL -slow to resolve despite course of Z-Pak and doxycycline. Chest x-ray is pending. Continue with mucociliary clearance regimen.  Asthma-recent flare with associated pneumonia.  Patient is to restart Trelegy.  Hold on additional prednisone at this time.  Chest x-ray is pending.  Patient assistance paperwork has been filled out for Trelegy.  To help with cost.  We emphasized the importance of consistent Trelegy use and provided samples until insurance coverage resumes.Asthma action plan discussed .  Diastolic Heart Failure They have mild diastolic heart failure with peripheral edema,-possible decompensation with lower extremity edema.  Check BNP today.  D-dimer is pending.  Low suspicion for PE. Continue on diuretics as directed.  Hypertension and diabetes continue follow-up with primary care.   Follow-up A follow-up with Dr. Vassie Loll in 6-8 weeks is scheduled. They should follow up with their primary care physician if swelling persists and consider a cardiologist referral if there is no improvement in edema.          I spent   41 minutes dedicated to the care of this patient on the date of this encounter to include pre-visit review of records, face-to-face time with the patient discussing conditions above, post visit ordering of testing, clinical documentation with the electronic health record, making appropriate referrals as documented, and communicating necessary findings to members of the patients care team.   Rubye Oaks, NP 08/06/2023

## 2023-08-06 NOTE — Patient Instructions (Signed)
Restart Trelegy 1 puff daily, rinse after use  Albuterol inhaler and neb As needed   Lasix as directed.  Chest xray and Labs today  Elevate legs when able  Follow up with Dr. Vassie Loll  6-8 weeks and As needed   Please contact office for sooner follow up if symptoms do not improve or worsen or seek emergency care

## 2023-08-07 ENCOUNTER — Encounter: Payer: Self-pay | Admitting: Adult Health

## 2023-08-07 ENCOUNTER — Ambulatory Visit (HOSPITAL_COMMUNITY)
Admission: RE | Admit: 2023-08-07 | Discharge: 2023-08-07 | Disposition: A | Discharge status: Home / Self Care | Payer: Medicare HMO | Source: Ambulatory Visit | Attending: Adult Health | Admitting: Adult Health

## 2023-08-07 DIAGNOSIS — J90 Pleural effusion, not elsewhere classified: Secondary | ICD-10-CM | POA: Diagnosis not present

## 2023-08-07 DIAGNOSIS — R0602 Shortness of breath: Secondary | ICD-10-CM | POA: Insufficient documentation

## 2023-08-07 DIAGNOSIS — I3139 Other pericardial effusion (noninflammatory): Secondary | ICD-10-CM | POA: Diagnosis not present

## 2023-08-07 DIAGNOSIS — R0600 Dyspnea, unspecified: Secondary | ICD-10-CM | POA: Diagnosis not present

## 2023-08-07 DIAGNOSIS — I517 Cardiomegaly: Secondary | ICD-10-CM | POA: Diagnosis not present

## 2023-08-07 LAB — D-DIMER, QUANTITATIVE: D-Dimer, Quant: 1.42 ug{FEU}/mL — ABNORMAL HIGH (ref <0.50)

## 2023-08-07 MED ORDER — IOHEXOL 350 MG/ML SOLN
75.0000 mL | Freq: Once | INTRAVENOUS | Status: AC | PRN
  Administered 2023-08-07: 75 mL via INTRAVENOUS

## 2023-08-07 MED ORDER — POTASSIUM CHLORIDE CRYS ER 20 MEQ PO TBCR: 20.0000 meq | EXTENDED_RELEASE_TABLET | Freq: Every day | ORAL | 0 refills | Status: AC

## 2023-08-07 NOTE — Progress Notes (Signed)
CT ordered this am. Patient aware .

## 2023-08-07 NOTE — Addendum Note (Signed)
Addended by: Julio Sicks on: 08/07/2023 10:04 AM   Modules accepted: Orders

## 2023-08-08 ENCOUNTER — Telehealth: Payer: Self-pay | Admitting: Cardiology

## 2023-08-08 NOTE — Telephone Encounter (Signed)
Pt notified of Dr. Ival Bible response. Pt voiced understanding.

## 2023-08-08 NOTE — Telephone Encounter (Signed)
Spoke with pt who states that she had a CT scan done on yesterday that showed fluid on the heart. She reports that she has been dealing with pneumonia for 2 months. She was seen by her PCP and pulmonary for this. Pt reports lower extremity swelling for 2 wks. She was placed on Lasix 20 mg twice a week. She is asking that Dr. Diona Browner review her CT scan from yesterday. Please advise.

## 2023-08-08 NOTE — Telephone Encounter (Signed)
Pt had CT Scan done with Pulmonary Doctor and it showed Fluid around her heart

## 2023-08-29 DIAGNOSIS — E1122 Type 2 diabetes mellitus with diabetic chronic kidney disease: Secondary | ICD-10-CM | POA: Diagnosis not present

## 2023-08-29 DIAGNOSIS — F411 Generalized anxiety disorder: Secondary | ICD-10-CM | POA: Diagnosis not present

## 2023-08-29 DIAGNOSIS — J449 Chronic obstructive pulmonary disease, unspecified: Secondary | ICD-10-CM | POA: Diagnosis not present

## 2023-08-29 DIAGNOSIS — N182 Chronic kidney disease, stage 2 (mild): Secondary | ICD-10-CM | POA: Diagnosis not present

## 2023-08-29 DIAGNOSIS — I5031 Acute diastolic (congestive) heart failure: Secondary | ICD-10-CM | POA: Diagnosis not present

## 2023-08-29 DIAGNOSIS — I10 Essential (primary) hypertension: Secondary | ICD-10-CM | POA: Diagnosis not present

## 2023-08-29 DIAGNOSIS — G4730 Sleep apnea, unspecified: Secondary | ICD-10-CM | POA: Diagnosis not present

## 2023-08-29 DIAGNOSIS — Z6828 Body mass index (BMI) 28.0-28.9, adult: Secondary | ICD-10-CM | POA: Diagnosis not present

## 2023-09-05 DIAGNOSIS — E113313 Type 2 diabetes mellitus with moderate nonproliferative diabetic retinopathy with macular edema, bilateral: Secondary | ICD-10-CM | POA: Diagnosis not present

## 2023-09-15 ENCOUNTER — Emergency Department (HOSPITAL_COMMUNITY)
Admission: EM | Admit: 2023-09-15 | Discharge: 2023-09-15 | Disposition: A | Discharge status: Home / Self Care | Payer: Medicare HMO | Attending: Emergency Medicine | Admitting: Emergency Medicine

## 2023-09-15 ENCOUNTER — Other Ambulatory Visit: Payer: Self-pay

## 2023-09-15 ENCOUNTER — Encounter (HOSPITAL_COMMUNITY): Payer: Self-pay

## 2023-09-15 DIAGNOSIS — E119 Type 2 diabetes mellitus without complications: Secondary | ICD-10-CM | POA: Insufficient documentation

## 2023-09-15 DIAGNOSIS — J449 Chronic obstructive pulmonary disease, unspecified: Secondary | ICD-10-CM | POA: Diagnosis not present

## 2023-09-15 DIAGNOSIS — Z794 Long term (current) use of insulin: Secondary | ICD-10-CM | POA: Insufficient documentation

## 2023-09-15 DIAGNOSIS — Z7951 Long term (current) use of inhaled steroids: Secondary | ICD-10-CM | POA: Diagnosis not present

## 2023-09-15 DIAGNOSIS — E1165 Type 2 diabetes mellitus with hyperglycemia: Secondary | ICD-10-CM | POA: Diagnosis not present

## 2023-09-15 LAB — CBG MONITORING, ED: Glucose-Capillary: 81 mg/dL (ref 70–99)

## 2023-09-15 NOTE — ED Triage Notes (Signed)
Pt reports her she took 4 units of insulin because her sugar read 200 and then she ate and she checked her sugar again on another monitor and it read 74 so she checked it on her other meter and it said 94 so she came in because she wasn't sure.  FSBS in triage is 81.

## 2023-09-15 NOTE — ED Provider Notes (Signed)
[place] EMERGENCY DEPARTMENT AT [facility] Provider Note   CSN: [id number] Arrival date & time: 09/15/23  1701     History  Chief Complaint  Patient presents with   wants blood sugar checked    Amy Cobb is a 63 y.o. female.  Pt is a 63 yo female with pmhx significant for DM2 (on insulin), copd, asthma, vulvar cancer, gerd, anxiety, and osa.  Pt said her bs was 200 today before she ate, so she took 4 units (per usual) and ate pasta with tuna.  BS was reading 74 afterwards.  She checked it on another device and it was 94.  She felt like her bs was low, so came in for [place] to check it because she lives alone and did not want her bs to drop at home.  She feels back to normal now. She has no complaints.       Home Medications Prior to Admission medications   Medication Sig Start Date End Date Taking? Authorizing Provider  Accu-Chek FastClix Lancets MISC Apply topically. 12/11/22   [provider]  Aflibercept (EYLEA) 2 MG/0.05ML SOLN 1 each by Intravitreal route See admin instructions. Every 5 weeks    [provider]  albuterol (PROVENTIL) (2.5 MG/3ML) 0.083% nebulizer solution Take 2.5 mg by nebulization every 8 (eight) hours as needed for wheezing or shortness of breath.     [provider]  albuterol (VENTOLIN HFA) 108 (90 Base) MCG/ACT inhaler Inhale 2 puffs into the lungs every 4 (four) hours as needed for wheezing or shortness of breath. 04/07/21   Oretha Milch, MD  Alcohol Swabs (B-D SINGLE USE SWABS REGULAR) PADS  03/07/20   [provider]  benzonatate (TESSALON) 100 MG capsule Take 1 capsule (100 mg total) by mouth 3 (three) times daily as needed for cough. 03/02/23   Bethann Berkshire, MD  Blood Glucose Monitoring Suppl (ACCU-CHEK GUIDE ME) w/Device KIT  09/18/22   [provider]  chlorthalidone (HYGROTON) 25 MG tablet Take 25 mg by mouth daily.    [provider]  cloNIDine (CATAPRES) 0.2 MG tablet Take 1  tablet (0.2 mg total) by mouth 3 (three) times daily. 01/21/18   Strader, Lennart Pall, PA-C  cyanocobalamin (,VITAMIN B-12,) 1000 MCG/ML injection Inject 1,000 mcg as directed every 30 (thirty) days.    [provider]  DROPLET PEN NEEDLES 32G X 4 MM MISC  08/24/20   [provider]  famotidine (PEPCID) 20 MG tablet Take 20 mg by mouth at bedtime.    [provider]  fexofenadine (ALLEGRA) 180 MG tablet as needed. 08/28/22   [provider]  fluticasone (FLONASE) 50 MCG/ACT nasal spray Place 1 spray into both nostrils daily.    [provider]  fluticasone (FLONASE) 50 MCG/ACT nasal spray Place 1 spray into both nostrils daily. 08/22/22   Jacalyn Lefevre, MD  Fluticasone-Umeclidin-Vilant (TRELEGY ELLIPTA) 200-62.5-25 MCG/ACT AEPB Inhale 1 puff into the lungs daily. 12/24/22   Oretha Milch, MD  Fluticasone-Umeclidin-Vilant (TRELEGY ELLIPTA) 200-62.5-25 MCG/ACT AEPB Inhale 1 puff into the lungs daily. 08/06/23   Parrett, Virgel Bouquet, NP  glimepiride (AMARYL) 4 MG tablet  08/15/20   [provider]  hydrALAZINE (APRESOLINE) 50 MG tablet TAKE 1 TABLET THREE TIMES DAILY 05/10/23   Jonelle Sidle, MD  insulin glargine (LANTUS) 100 UNIT/ML injection Inject 50 Units into the skin 2 (two) times daily.    [provider]  insulin lispro (HUMALOG) 100 UNIT/ML KwikPen Inject 25  Units into the skin 3 (three) times daily.    [provider]  labetalol (NORMODYNE) 200 MG tablet TAKE 2 TABLETS TWICE DAILY 02/11/23   Jonelle Sidle, MD  losartan (COZAAR) 100 MG tablet TAKE 1 TABLET EVERY DAY 02/11/23   Jonelle Sidle, MD  methylPREDNISolone (MEDROL DOSEPAK) 4 MG TBPK tablet Take by mouth as directed. 06/12/23   [provider]  minoxidil (LONITEN) 2.5 MG tablet Take 2.5 mg by mouth daily.    [provider]  potassium chloride SA (KLOR-CON M) 20 MEQ tablet Take 1 tablet (20 mEq total) by mouth daily. 08/07/23   Parrett, Virgel Bouquet, NP   promethazine-dextromethorphan (PROMETHAZINE-DM) 6.25-15 MG/5ML syrup Take 5 mLs by mouth every 6 (six) hours as needed for cough. 08/22/22   [provider]  rosuvastatin (CRESTOR) 5 MG tablet Take 1 tablet (5 mg total) by mouth daily. 01/24/23 04/24/23  Jonelle Sidle, MD      Allergies    Augmentin [amoxicillin-pot clavulanate], Penicillins, Clavulanic acid, Gabapentin, Moxifloxacin, and Novolog [insulin aspart (human analog)]    Review of Systems   Review of Systems  All other systems reviewed and are negative.   Physical Exam Updated Vital Signs BP (!) 154/73   Pulse 91   Temp 99.2 F (37.3 C)   Resp 18   Wt 79.4 kg   SpO2 99%   BMI 27.41 kg/m  Physical Exam Vitals and nursing note reviewed.  Constitutional:      Appearance: Normal appearance.  HENT:     Head: Normocephalic and atraumatic.     Right Ear: External ear normal.     Left Ear: External ear normal.     Nose: Nose normal.     Mouth/Throat:     Mouth: Mucous membranes are moist.     Pharynx: Oropharynx is clear.  Eyes:     Extraocular Movements: Extraocular movements intact.     Conjunctiva/sclera: Conjunctivae normal.     Pupils: Pupils are equal, round, and reactive to light.  Cardiovascular:     Rate and Rhythm: Normal rate and regular rhythm.     Pulses: Normal pulses.     Heart sounds: Normal heart sounds.  Pulmonary:     Effort: Pulmonary effort is normal.     Breath sounds: Normal breath sounds.  Abdominal:     General: Abdomen is flat. Bowel sounds are normal.     Palpations: Abdomen is soft.  Musculoskeletal:        General: Normal range of motion.     Cervical back: Normal range of motion and neck supple.  Skin:    General: Skin is warm.     Capillary Refill: Capillary refill takes less than 2 seconds.  Neurological:     General: No focal deficit present.     Mental Status: She is alert and oriented to person, place, and time.  Psychiatric:        Mood and Affect: Mood normal.         Behavior: Behavior normal.     ED Results / Procedures / Treatments   Labs (all labs ordered are listed, but only abnormal results are displayed) Labs Reviewed  CBG MONITORING, ED    EKG None  Radiology No results found.  Procedures Procedures    Medications Ordered in ED Medications - No data to display  ED Course/ Medical Decision Making/ A&P  Medical Decision Making  BS is 50 now.  She is encouraged to go home and eat a high protein meal.  As her bs was 81, I told her to take 1/2 her usual lantus dose tonight and recheck bs in the morning.  She knows to return if worse.          Final Clinical Impression(s) / ED Diagnoses Final diagnoses:  Type 2 diabetes mellitus without complication, with long-term current use of insulin [facility])    Rx / DC Orders ED Discharge Orders     None         Jacalyn Lefevre, MD 09/15/23 [phone]

## 2023-10-08 ENCOUNTER — Other Ambulatory Visit: Payer: Self-pay | Admitting: Internal Medicine

## 2023-10-08 DIAGNOSIS — Z1231 Encounter for screening mammogram for malignant neoplasm of breast: Secondary | ICD-10-CM

## 2023-10-15 DIAGNOSIS — I10 Essential (primary) hypertension: Secondary | ICD-10-CM | POA: Diagnosis not present

## 2023-10-15 DIAGNOSIS — J440 Chronic obstructive pulmonary disease with acute lower respiratory infection: Secondary | ICD-10-CM | POA: Diagnosis not present

## 2023-10-15 DIAGNOSIS — Z6826 Body mass index (BMI) 26.0-26.9, adult: Secondary | ICD-10-CM | POA: Diagnosis not present

## 2023-10-22 ENCOUNTER — Encounter (HOSPITAL_BASED_OUTPATIENT_CLINIC_OR_DEPARTMENT_OTHER): Payer: Self-pay | Admitting: Pulmonary Disease

## 2023-10-22 ENCOUNTER — Ambulatory Visit (HOSPITAL_BASED_OUTPATIENT_CLINIC_OR_DEPARTMENT_OTHER): Payer: Medicare HMO | Admitting: Pulmonary Disease

## 2023-10-22 VITALS — BP 126/74 | HR 91 | Ht 67.0 in | Wt 165.4 lb

## 2023-10-22 DIAGNOSIS — Z87891 Personal history of nicotine dependence: Secondary | ICD-10-CM | POA: Diagnosis not present

## 2023-10-22 DIAGNOSIS — J4540 Moderate persistent asthma, uncomplicated: Secondary | ICD-10-CM | POA: Diagnosis not present

## 2023-10-22 DIAGNOSIS — G4733 Obstructive sleep apnea (adult) (pediatric): Secondary | ICD-10-CM | POA: Diagnosis not present

## 2023-10-22 DIAGNOSIS — I10 Essential (primary) hypertension: Secondary | ICD-10-CM

## 2023-10-22 NOTE — Progress Notes (Signed)
 Subjective:    Patient ID: Amy Cobb, female    DOB: [birth year], 63 y.o.   MRN: [id number]  HPI  63  yo ex-smoker  For FU of asthma and OSA -unable to tolerate CPAP She smoked about a pack per week starting at age 6 until age 42, less than 10 pack years.  She reports that her breathing problem started when she moved from [place] to [place] in 2005.  She reports prednisone tapers 5-6 times in the year and hospitalized at least once or twice every year.  OV 02/2022 >>continues to have flares of her asthma. -step up therapy from Symbicort to Trelegy   PMH - hospitalized with Covid pneumonia 03/2020, discharged on 4 L oxygen.    She has a delayed sleep phase syndrome with bedtime as late as 2 AM  -insulin requiring diabetes, hypertension requiring 6 medications    ED visit 09/2021 for chest pain, CT angiogram was negative for pulmonary embolism but showed small pericardial effusion. She was treated with NSAIDs for presumed pericarditis.   38-month follow-up visit She was treated for pneumonia in December and improved and then got sick again, PCP treated her with steroids Trelegy was very expensive, even Symbicort was expensive, PCP gave her a prescription for budesonide nebs twice daily which was more affordable  Miss Amy Cobb, a patient with a history of asthma and sleep apnea, presents for follow-up after a recent bout of pneumonia. She reports that she has just completed a course of steroids and antibiotics. Despite this, she still experiences some chest tightness, particularly in the back. She has been prescribed a new medication for her nebulizer, which she uses in addition to her regular albuterol.  The patient also discusses the financial burden of her asthma medications, Symbicort and Trelegy, which she cannot afford. As a result, she is currently only using her rescue inhaler. She has been prescribed a new medication for her nebulizer, which she finds more affordable and  effective.  In addition to her respiratory issues, the patient also reports episodes of high blood pressure and increased heart rate. These episodes occur sporadically, even during periods of relaxation. She plans to discuss these episodes with her cardiologist.  Significant tests/ events reviewed    04/2021 lexiscan myoview >> low risk study   PFTs 10/2020 no airway obstruction, nml FEV1, DLCO 74%   09/2020 ONO/RA shows minimal desaturation for 7 minutes less than 88%.>> dc O2   10/2022 HST  very mild OSA with AHI 6/ hr   04/2018 NPSG -mild OSA, AHI 9/hour, RDI 30/hour 05/2018 CPAP titration >> 10 cm , medium mask, wt 174 lbs  Review of Systems neg for any significant sore throat, dysphagia, itching, sneezing, nasal congestion or excess/ purulent secretions, fever, chills, sweats, unintended wt loss, pleuritic or exertional cp, hempoptysis, orthopnea pnd or change in chronic leg swelling. Also denies presyncope, palpitations, heartburn, abdominal pain, nausea, vomiting, diarrhea or change in bowel or urinary habits, dysuria,hematuria, rash, arthralgias, visual complaints, headache, numbness weakness or ataxia.     Objective:   Physical Exam  Gen. Pleasant, well-nourished, in no distress ENT - no thrush, no pallor/icterus,no post nasal drip Neck: No JVD, no thyromegaly, no carotid bruits Lungs: no use of accessory muscles, no dullness to percussion, clear without rales or rhonchi  Cardiovascular: Rhythm regular, heart sounds  normal, no murmurs or gallops, no peripheral edema Musculoskeletal: No deformities, no cyanosis or clubbing        Assessment & Plan:  Asthma Asthma exacerbation with recent pneumonia in December. Reports tightness in breathing and residual pain. Currently using albuterol nebulizer and a new 12-hour nebulization medication due to financial constraints preventing the use of Symbicort or Trelegy. - Continue budesonide and albuterol for breakthrough symptoms -  Use the new nebulizer medication twice daily as a substitute for Symbicort - Consider re-evaluating medication options in the second half of the year when costs may decrease  Sleep Apnea intolerant of CPAP & does not want to pursue  Hypertension Intermittent elevated blood pressure and heart rate, particularly concerning at rest. On multiple antihypertensive medications. Episodes may be related to recent illness and medication changes, including steroids and Lasix. - Follow up with cardiologist, Dr. Diona Browner, to evaluate intermittent hypertension and heart rate issues

## 2023-10-22 NOTE — Patient Instructions (Signed)
 Okay to use budesonide nebs twice daily. Use albuterol as needed for rescue

## 2023-11-08 ENCOUNTER — Ambulatory Visit: Payer: Medicare HMO

## 2023-11-22 ENCOUNTER — Other Ambulatory Visit: Payer: Self-pay | Admitting: Cardiology

## 2023-11-27 DIAGNOSIS — Z794 Long term (current) use of insulin: Secondary | ICD-10-CM | POA: Diagnosis not present

## 2023-11-27 DIAGNOSIS — E113313 Type 2 diabetes mellitus with moderate nonproliferative diabetic retinopathy with macular edema, bilateral: Secondary | ICD-10-CM | POA: Diagnosis not present

## 2023-11-28 DIAGNOSIS — Z0000 Encounter for general adult medical examination without abnormal findings: Secondary | ICD-10-CM | POA: Diagnosis not present

## 2023-11-28 DIAGNOSIS — F331 Major depressive disorder, recurrent, moderate: Secondary | ICD-10-CM | POA: Diagnosis not present

## 2023-11-28 DIAGNOSIS — N182 Chronic kidney disease, stage 2 (mild): Secondary | ICD-10-CM | POA: Diagnosis not present

## 2023-11-28 DIAGNOSIS — J449 Chronic obstructive pulmonary disease, unspecified: Secondary | ICD-10-CM | POA: Diagnosis not present

## 2023-11-28 DIAGNOSIS — I10 Essential (primary) hypertension: Secondary | ICD-10-CM | POA: Diagnosis not present

## 2023-11-28 DIAGNOSIS — G4730 Sleep apnea, unspecified: Secondary | ICD-10-CM | POA: Diagnosis not present

## 2023-11-28 DIAGNOSIS — I5031 Acute diastolic (congestive) heart failure: Secondary | ICD-10-CM | POA: Diagnosis not present

## 2023-11-28 DIAGNOSIS — Z6825 Body mass index (BMI) 25.0-25.9, adult: Secondary | ICD-10-CM | POA: Diagnosis not present

## 2023-11-28 DIAGNOSIS — F411 Generalized anxiety disorder: Secondary | ICD-10-CM | POA: Diagnosis not present

## 2023-11-28 DIAGNOSIS — E1122 Type 2 diabetes mellitus with diabetic chronic kidney disease: Secondary | ICD-10-CM | POA: Diagnosis not present

## 2023-11-29 ENCOUNTER — Ambulatory Visit

## 2023-12-02 ENCOUNTER — Ambulatory Visit
Admission: RE | Admit: 2023-12-02 | Discharge: 2023-12-02 | Disposition: A | Discharge status: Home / Self Care | Source: Ambulatory Visit | Attending: Internal Medicine | Admitting: Internal Medicine

## 2023-12-02 DIAGNOSIS — Z1231 Encounter for screening mammogram for malignant neoplasm of breast: Secondary | ICD-10-CM

## 2023-12-11 DIAGNOSIS — E113313 Type 2 diabetes mellitus with moderate nonproliferative diabetic retinopathy with macular edema, bilateral: Secondary | ICD-10-CM | POA: Diagnosis not present

## 2023-12-14 ENCOUNTER — Other Ambulatory Visit: Payer: Self-pay | Admitting: Cardiology

## 2023-12-17 ENCOUNTER — Telehealth: Payer: Self-pay | Admitting: Cardiology

## 2023-12-17 MED ORDER — LABETALOL HCL 200 MG PO TABS: 400.0000 mg | ORAL_TABLET | Freq: Two times a day (BID) | ORAL | 0 refills | Status: DC

## 2023-12-17 MED ORDER — LOSARTAN POTASSIUM 100 MG PO TABS: 100.0000 mg | ORAL_TABLET | Freq: Every day | ORAL | 0 refills | Status: DC

## 2023-12-17 NOTE — Telephone Encounter (Signed)
 Second notice,pt needs apt,cancelled 6 mons f/u 07/2023  Messaged scheduling to make appointment

## 2023-12-17 NOTE — Telephone Encounter (Signed)
*  STAT* If patient is at the pharmacy, call can be transferred to refill team.   1. Which medications need to be refilled? (please list name of each medication and dose if known) labetalol  (NORMODYNE ) 200 MG tablet   losartan  (COZAAR ) 100 MG tablet   2. Which pharmacy/location (including street and city if local pharmacy) is medication to be sent to? [facility] - [place], [place] - [address]   3. Do they need a 30 day or 90 day supply? 90

## 2024-01-01 ENCOUNTER — Ambulatory Visit: Admitting: Podiatry

## 2024-01-01 DIAGNOSIS — E1165 Type 2 diabetes mellitus with hyperglycemia: Secondary | ICD-10-CM

## 2024-01-01 DIAGNOSIS — M2042 Other hammer toe(s) (acquired), left foot: Secondary | ICD-10-CM | POA: Diagnosis not present

## 2024-01-01 DIAGNOSIS — E119 Type 2 diabetes mellitus without complications: Secondary | ICD-10-CM

## 2024-01-01 DIAGNOSIS — Z794 Long term (current) use of insulin: Secondary | ICD-10-CM

## 2024-01-01 DIAGNOSIS — M2041 Other hammer toe(s) (acquired), right foot: Secondary | ICD-10-CM

## 2024-01-01 NOTE — Progress Notes (Signed)
 Subjective: Amy Cobb presents today referred by Veda Gerald, MD for diabetic foot evaluation.  Patient relates 22 year history of diabetes.  Patient denies any history of foot wounds.  Patient has history of numbness, tingling, burning, pins/needles sensations.  Past Medical History:  Diagnosis Date   Acid reflux    Anxiety    Asthma    COPD (chronic obstructive pulmonary disease) (HCC)    Depression    Essential hypertension    OSA on CPAP    Type 2 diabetes mellitus (HCC)    Vulvar cancer (HCC) 2003    Patient Active Problem List   Diagnosis Date Noted   Pain due to onychomycosis of toenails of both feet 11/14/2021   Chronic respiratory failure with hypoxia (HCC) 09/23/2020   Asthma exacerbation, non-allergic, mild persistent 09/23/2020   OSA (obstructive sleep apnea) 09/23/2020   COVID-19 04/16/2020   S/P nasal septoplasty 02/17/2020   Anxiety disorder, unspecified 02/11/2017   Major depressive disorder, recurrent episode (HCC) 02/11/2017   Essential hypertension, benign 01/15/2014   Type 2 diabetes mellitus (HCC) 01/15/2014   Pericardial effusion 01/14/2014    Past Surgical History:  Procedure Laterality Date   ABDOMINAL HYSTERECTOMY     BREAST CYST EXCISION Right    CERVICAL FUSION     CO2 LASER APPLICATION  2003   CO2 LASER VAPORIZATION OF THE VULVAR LESION   NASAL SEPTOPLASTY W/ TURBINOPLASTY N/A 02/17/2020   Procedure: BILATERAL NASAL SEPTOPLASTY;  Surgeon: Reynold Caves, MD;  Location: [facility] OR;  Service: ENT;  Laterality: N/A;   SHOULDER SURGERY     TURBINATE REDUCTION Bilateral 02/17/2020   Procedure: BILATERAL TURBINATE REDUCTION;  Surgeon: Reynold Caves, MD;  Location: [facility] OR;  Service: ENT;  Laterality: Bilateral;    Current Outpatient Medications on File Prior to Visit  Medication Sig Dispense Refill   Accu-Chek FastClix Lancets MISC Apply topically.     Aflibercept  (EYLEA ) 2 MG/0.05ML SOLN 1 each by Intravitreal route See admin instructions. Every 5  weeks     albuterol  (PROVENTIL ) (2.5 MG/3ML) 0.083% nebulizer solution Take 2.5 mg by nebulization every 8 (eight) hours as needed for wheezing or shortness of breath.      albuterol  (VENTOLIN  HFA) 108 (90 Base) MCG/ACT inhaler Inhale 2 puffs into the lungs every 4 (four) hours as needed for wheezing or shortness of breath. 8 g 2   Alcohol Swabs (B-D SINGLE USE SWABS REGULAR) PADS      benzonatate  (TESSALON ) 100 MG capsule Take 1 capsule (100 mg total) by mouth 3 (three) times daily as needed for cough. 21 capsule 0   Blood Glucose Monitoring Suppl (ACCU-CHEK GUIDE ME) w/Device KIT      chlorthalidone  (HYGROTON ) 25 MG tablet Take 25 mg by mouth daily.     cloNIDine  (CATAPRES ) 0.2 MG tablet Take 1 tablet (0.2 mg total) by mouth 3 (three) times daily.     cyanocobalamin (,VITAMIN B-12,) 1000 MCG/ML injection Inject 1,000 mcg as directed every 30 (thirty) days.     DROPLET PEN NEEDLES 32G X 4 MM MISC      famotidine  (PEPCID ) 20 MG tablet Take 20 mg by mouth at bedtime. (Patient not taking: Reported on 10/22/2023)     fexofenadine (ALLEGRA) 180 MG tablet as needed.     fluticasone  (FLONASE ) 50 MCG/ACT nasal spray Place 1 spray into both nostrils daily.     fluticasone  (FLONASE ) 50 MCG/ACT nasal spray Place 1 spray into both nostrils daily. 11.1 mL 2   glimepiride (AMARYL) 4  MG tablet      hydrALAZINE  (APRESOLINE ) 50 MG tablet TAKE 1 TABLET THREE TIMES DAILY 270 tablet 3   insulin  glargine (LANTUS ) 100 UNIT/ML injection Inject 50 Units into the skin 2 (two) times daily.     insulin  lispro (HUMALOG ) 100 UNIT/ML KwikPen Inject 25 Units into the skin 3 (three) times daily.     labetalol  (NORMODYNE ) 200 MG tablet Take 2 tablets (400 mg total) by mouth 2 (two) times daily. 60 tablet 0   losartan  (COZAAR ) 100 MG tablet Take 1 tablet (100 mg total) by mouth daily. 30 tablet 0   methylPREDNISolone  (MEDROL  DOSEPAK) 4 MG TBPK tablet Take by mouth as directed. (Patient not taking: Reported on 10/22/2023)      minoxidil  (LONITEN ) 2.5 MG tablet Take 2.5 mg by mouth daily.     potassium chloride  SA (KLOR-CON  M) 20 MEQ tablet Take 1 tablet (20 mEq total) by mouth daily. 30 tablet 0   promethazine -dextromethorphan (PROMETHAZINE -DM) 6.25-15 MG/5ML syrup Take 5 mLs by mouth every 6 (six) hours as needed for cough.     rosuvastatin  (CRESTOR ) 5 MG tablet Take 1 tablet (5 mg total) by mouth daily. 90 tablet 3   No current facility-administered medications on file prior to visit.     Allergies  Allergen Reactions   Augmentin [Amoxicillin-Pot Clavulanate] Anaphylaxis and Other (See Comments)    Hallucinations.   Penicillins Anaphylaxis and Nausea And Vomiting    Hallucinations.   Clavulanic Acid    Gabapentin     Hallucinate    Moxifloxacin Hives   Novolog  [Insulin  Aspart (Human Analog)] Rash    Social History   Occupational History   Not on file  Tobacco Use   Smoking status: Former    Current packs/day: 0.00    Types: Cigarettes    Quit date: 12/25/2008    Years since quitting: 15.0   Smokeless tobacco: Never   Tobacco comments:    Used to smoke about 3 a day  Vaping Use   Vaping status: Never Used  Substance and Sexual Activity   Alcohol use: Yes    Comment: occaisonally   Drug use: No   Sexual activity: Yes    Partners: Male    Comment: 1st intercourse- 9, partners- (37 female) (70 female),  married- 16 yrs     Family History  Problem Relation Age of Onset   Hypertension Mother    Hypertension Sister    Breast cancer Sister 58   Diabetes Sister    Breast cancer Cousin        several cousins   Diabetes Cousin     Immunization History  Administered Date(s) Administered   Moderna Sars-Covid-2 Vaccination 08/23/2020, 09/27/2020    Review of systems: Positive Findings in bold print.  Constitutional:  chills, fatigue, fever, sweats, weight change Communication: Nurse, learning disability, sign Presenter, broadcasting, hand writing, iPad/Android device Head: headaches, head injury Eyes: changes  in vision, eye pain, glaucoma, cataracts, macular degeneration, diplopia, glare,  light sensitivity, eyeglasses or contacts, blindness Ears nose mouth throat: hearing impaired, hearing aids,  ringing in ears, deaf, sign language,  vertigo, nosebleeds,  rhinitis,  cold sores, snoring, swollen glands Cardiovascular: HTN, edema, arrhythmia, pacemaker in place, defibrillator in place, chest pain/tightness, chronic anticoagulation, blood clot, heart failure, MI Peripheral Vascular: leg cramps, varicose veins, blood clots, lymphedema, varicosities Respiratory:  asthma, difficulty breathing, denies congestion, SOB, wheezing, cough, emphysema Gastrointestinal: change in appetite or weight, abdominal pain, constipation, diarrhea, nausea, vomiting, vomiting blood, change in bowel habits, abdominal  pain, jaundice, rectal bleeding, hemorrhoids, GERD Genitourinary:  nocturia,  pain on urination, polyuria,  blood in urine, Foley catheter, urinary urgency, ESRD on hemodialysis Musculoskeletal: amputation, cramping, stiff joints, painful joints, decreased joint motion, fractures, OA, gout, hemiplegia, paraplegia, uses cane, wheelchair bound, uses walker, uses rollator Skin: +changes in toenails, color change, dryness, itching, mole changes,  rash, wound(s) Neurological: headaches, numbness in feet, paresthesias in feet, burning in feet, fainting,  seizures, change in speech, migraines, memory problems/poor historian, cerebral palsy, weakness, paralysis, CVA, TIA Endocrine: diabetes, hypothyroidism, hyperthyroidism,  goiter, dry mouth, flushing, heat intolerance, cold intolerance,  excessive thirst, denies polyuria,  nocturia Hematological:  easy bleeding, excessive bleeding, easy bruising, enlarged lymph nodes, on long term blood thinner, history of past transusions Allergy /immunological:  hives, eczema, frequent infections, multiple drug allergies, seasonal allergies, transplant recipient, multiple food  allergies Psychiatric:  anxiety, depression, mood disorder, suicidal ideations, hallucinations, insomnia  Objective: There were no vitals filed for this visit. Vascular Examination: Capillary refill time less than 3 seconds x 10 digits.  Dorsalis pedis pulses palpable 2 out of 4.  Posterior tibial pulses palpable 2 out of 4.  Digital hair not present x 10 digits.  Skin temperature gradient WNL b/l.  Dermatological Examination: Skin with normal turgor, texture and tone b/l  Toenails 1-5 b/l discolored, thick, dystrophic with subungual debris and pain with palpation to nailbeds due to thickness of nails.  Musculoskeletal: Muscle strength 5/5 to all LE muscle groups.  Neurological: Sensation intact with 10 gram monofilament.  Vibratory sensation intact.  Assessment: NIDDM Encounter for diabetic foot examination Hammertoe 2 through 4 bilateral  Plan: Discussed diabetic foot care principles. Literature dispensed on today. Patient to continue soft, supportive shoe gear daily. Patient to report any pedal injuries to medical professional immediately. Follow up one year. Patient/POA to call should there be a concern in the interim Given the nature of hammertoe deformity patient will benefit from diabetic shoes.  Patient has noted to be casted for diabetic shoes few days ago.

## 2024-01-03 ENCOUNTER — Other Ambulatory Visit: Payer: Self-pay | Admitting: Cardiology

## 2024-01-07 ENCOUNTER — Telehealth: Payer: Self-pay | Admitting: Podiatry

## 2024-01-07 DIAGNOSIS — E1165 Type 2 diabetes mellitus with hyperglycemia: Secondary | ICD-10-CM

## 2024-01-07 DIAGNOSIS — M2042 Other hammer toe(s) (acquired), left foot: Secondary | ICD-10-CM

## 2024-01-07 NOTE — Telephone Encounter (Signed)
 Although Pt was given prescription by Dr. Lydia Sams, Amy Cobb says needs to fax to them before she can be seen

## 2024-01-08 ENCOUNTER — Other Ambulatory Visit: Payer: Self-pay | Admitting: Cardiology

## 2024-01-17 ENCOUNTER — Other Ambulatory Visit: Payer: Self-pay

## 2024-01-17 ENCOUNTER — Encounter (HOSPITAL_COMMUNITY): Payer: Self-pay

## 2024-01-17 ENCOUNTER — Emergency Department (HOSPITAL_COMMUNITY)

## 2024-01-17 ENCOUNTER — Emergency Department (HOSPITAL_COMMUNITY)
Admission: EM | Admit: 2024-01-17 | Discharge: 2024-01-17 | Disposition: A | Discharge status: Home / Self Care | Attending: Emergency Medicine | Admitting: Emergency Medicine

## 2024-01-17 DIAGNOSIS — J449 Chronic obstructive pulmonary disease, unspecified: Secondary | ICD-10-CM | POA: Insufficient documentation

## 2024-01-17 DIAGNOSIS — R0602 Shortness of breath: Secondary | ICD-10-CM | POA: Diagnosis not present

## 2024-01-17 DIAGNOSIS — R059 Cough, unspecified: Secondary | ICD-10-CM | POA: Diagnosis not present

## 2024-01-17 DIAGNOSIS — Z7951 Long term (current) use of inhaled steroids: Secondary | ICD-10-CM | POA: Diagnosis not present

## 2024-01-17 DIAGNOSIS — Z794 Long term (current) use of insulin: Secondary | ICD-10-CM | POA: Insufficient documentation

## 2024-01-17 LAB — COMPREHENSIVE METABOLIC PANEL WITH GFR
ALT: 13 U/L (ref 0–44)
AST: 20 U/L (ref 15–41)
Albumin: 3.6 g/dL (ref 3.5–5.0)
Alkaline Phosphatase: 74 U/L (ref 38–126)
Anion gap: 11 (ref 5–15)
BUN: 14 mg/dL (ref 6–20)
CO2: 30 mmol/L (ref 22–32)
Calcium: 9.9 mg/dL (ref 8.9–10.3)
Chloride: 98 mmol/L (ref 98–111)
Creatinine, Ser: 0.77 mg/dL (ref 0.44–1.00)
GFR, Estimated: >60 mL/min (ref >60)
Glucose, Bld: 141 mg/dL — ABNORMAL HIGH (ref 70–99)
Potassium: 3.8 mmol/L (ref 3.5–5.1)
Sodium: 139 mmol/L (ref 135–145)
Total Bilirubin: 0.5 mg/dL (ref 0.0–1.2)
Total Protein: 7.2 g/dL (ref 6.5–8.1)

## 2024-01-17 LAB — CBC WITH DIFFERENTIAL/PLATELET
Abs Immature Granulocytes: 0.02 10*3/uL (ref 0.00–0.07)
Basophils Absolute: 0.1 10*3/uL (ref 0.0–0.1)
Basophils Relative: 1 %
Eosinophils Absolute: 0.2 10*3/uL (ref 0.0–0.5)
Eosinophils Relative: 4 %
HCT: 40.8 % (ref 36.0–46.0)
Hemoglobin: 12.8 g/dL (ref 12.0–15.0)
Immature Granulocytes: 0 %
Lymphocytes Relative: 33 %
Lymphs Abs: 1.8 10*3/uL (ref 0.7–4.0)
MCH: 24.4 pg — ABNORMAL LOW (ref 26.0–34.0)
MCHC: 31.4 g/dL (ref 30.0–36.0)
MCV: 77.9 fL — ABNORMAL LOW (ref 80.0–100.0)
Monocytes Absolute: 0.8 10*3/uL (ref 0.1–1.0)
Monocytes Relative: 14 %
Neutro Abs: 2.7 10*3/uL (ref 1.7–7.7)
Neutrophils Relative %: 48 %
Platelets: 299 10*3/uL (ref 150–400)
RBC: 5.24 MIL/uL — ABNORMAL HIGH (ref 3.87–5.11)
RDW: 17.7 % — ABNORMAL HIGH (ref 11.5–15.5)
WBC: 5.6 10*3/uL (ref 4.0–10.5)
nRBC: 0 % (ref 0.0–0.2)

## 2024-01-17 LAB — RESP PANEL BY RT-PCR (RSV, FLU A&B, COVID)  RVPGX2
Influenza A by PCR: NEGATIVE
Influenza B by PCR: NEGATIVE
Resp Syncytial Virus by PCR: NEGATIVE
SARS Coronavirus 2 by RT PCR: NEGATIVE

## 2024-01-17 MED ORDER — IPRATROPIUM-ALBUTEROL 0.5-2.5 (3) MG/3ML IN SOLN
3.0000 mL | Freq: Once | RESPIRATORY_TRACT | Status: AC
  Administered 2024-01-17: 3 mL via RESPIRATORY_TRACT
  Filled 2024-01-17: qty 3

## 2024-01-17 MED ORDER — PREDNISONE 20 MG PO TABS: 40.0000 mg | ORAL_TABLET | Freq: Every day | ORAL | 0 refills | Status: DC

## 2024-01-17 MED ORDER — ALBUTEROL SULFATE (2.5 MG/3ML) 0.083% IN NEBU
10.0000 mg/h | INHALATION_SOLUTION | Freq: Once | RESPIRATORY_TRACT | Status: AC
  Administered 2024-01-17: 10 mg/h via RESPIRATORY_TRACT
  Filled 2024-01-17: qty 3

## 2024-01-17 MED ORDER — SODIUM CHLORIDE 0.9 % IV BOLUS
500.0000 mL | Freq: Once | INTRAVENOUS | Status: AC
  Administered 2024-01-17: 500 mL via INTRAVENOUS

## 2024-01-17 MED ORDER — METHYLPREDNISOLONE SODIUM SUCC 125 MG IJ SOLR
125.0000 mg | Freq: Once | INTRAMUSCULAR | Status: AC
  Administered 2024-01-17: 125 mg via INTRAVENOUS
  Filled 2024-01-17: qty 2

## 2024-01-17 NOTE — ED Provider Notes (Signed)
 [place] [facility] AT [facility] Provider Note   CSN: [id number] Arrival date & time: 01/17/24  1307     History  Chief Complaint  Patient presents with   Shortness of Breath    Amy Cobb is a 63 y.o. female.  HPI Patient presents with shortness of breath.  Patient has pulmonary disease, chronic bronchitis versus mild COPD.  She uses albuterol , nebulizer, inhaler regularly.  Over the past 2 days, weeks she has had increasing dyspnea.  No fever, no chest pain, no abdominal pain.  No relief with medications.  She is here with her mother who assists with the history.    Home Medications Prior to Admission medications   Medication Sig Start Date End Date Taking? Authorizing Provider  predniSONE  (DELTASONE ) 20 MG tablet Take 2 tablets (40 mg total) by mouth daily with breakfast. For the next four days 01/17/24  Yes Dorenda Gandy, MD  Accu-Chek FastClix Lancets MISC Apply topically. 12/11/22   [provider]  Aflibercept  (EYLEA ) 2 MG/0.05ML SOLN 1 each by Intravitreal route See admin instructions. Every 5 weeks    [provider]  albuterol  (PROVENTIL ) (2.5 MG/3ML) 0.083% nebulizer solution Take 2.5 mg by nebulization every 8 (eight) hours as needed for wheezing or shortness of breath.     [provider]  albuterol  (VENTOLIN  HFA) 108 (90 Base) MCG/ACT inhaler Inhale 2 puffs into the lungs every 4 (four) hours as needed for wheezing or shortness of breath. 04/07/21   Lind Repine, MD  Alcohol Swabs (B-D SINGLE USE SWABS REGULAR) PADS  03/07/20   [provider]  benzonatate  (TESSALON ) 100 MG capsule Take 1 capsule (100 mg total) by mouth 3 (three) times daily as needed for cough. 03/02/23   Cheyenne Cotta, MD  Blood Glucose Monitoring Suppl (ACCU-CHEK GUIDE ME) w/Device KIT  09/18/22   [provider]  chlorthalidone  (HYGROTON ) 25 MG tablet Take 25 mg by mouth daily.    [provider]  cloNIDine   (CATAPRES ) 0.2 MG tablet Take 1 tablet (0.2 mg total) by mouth 3 (three) times daily. 01/21/18   Strader, Dimple Francis, PA-C  cyanocobalamin (,VITAMIN B-12,) 1000 MCG/ML injection Inject 1,000 mcg as directed every 30 (thirty) days.    [provider]  DROPLET PEN NEEDLES 32G X 4 MM MISC  08/24/20   [provider]  famotidine  (PEPCID ) 20 MG tablet Take 20 mg by mouth at bedtime. Patient not taking: Reported on 10/22/2023    [provider]  fexofenadine (ALLEGRA) 180 MG tablet as needed. 08/28/22   [provider]  fluticasone  (FLONASE ) 50 MCG/ACT nasal spray Place 1 spray into both nostrils daily.    [provider]  fluticasone  (FLONASE ) 50 MCG/ACT nasal spray Place 1 spray into both nostrils daily. 08/22/22   Haviland, Julie, MD  glimepiride (AMARYL) 4 MG tablet  08/15/20   [provider]  hydrALAZINE  (APRESOLINE ) 50 MG tablet TAKE 1 TABLET THREE TIMES DAILY 05/10/23   Gerard Knight, MD  insulin  glargine (LANTUS ) 100 UNIT/ML injection Inject 50 Units into the skin 2 (two) times daily.    [provider]  insulin  lispro (HUMALOG ) 100 UNIT/ML KwikPen Inject 25 Units into the skin 3 (three) times daily.    [provider]  labetalol  (NORMODYNE ) 200 MG tablet TAKE 2 TABLETS TWICE DAILY 01/03/24   Gerard Knight, MD  losartan  (COZAAR ) 100 MG tablet TAKE 1 TABLET EVERY DAY (NEED MD APPOINTMENT) 01/08/24   Gerard Knight,  MD  minoxidil  (LONITEN ) 2.5 MG tablet Take 2.5 mg by mouth daily.    [provider]  potassium chloride  SA (KLOR-CON  M) 20 MEQ tablet Take 1 tablet (20 mEq total) by mouth daily. 08/07/23   Parrett, Macdonald Savoy, NP  promethazine -dextromethorphan (PROMETHAZINE -DM) 6.25-15 MG/5ML syrup Take 5 mLs by mouth every 6 (six) hours as needed for cough. 08/22/22   [provider]  rosuvastatin  (CRESTOR ) 5 MG tablet Take 1 tablet (5 mg total) by mouth daily. 01/24/23 04/24/23  Gerard Knight, MD       Allergies    Augmentin [amoxicillin-pot clavulanate], Penicillins, Clavulanic acid, Gabapentin, Moxifloxacin, and Novolog  [insulin  aspart (human analog)]    Review of Systems   Review of Systems  Physical Exam Updated Vital Signs BP (!) 198/93   Pulse 86   Temp 98.3 F (36.8 C) (Oral)   Resp (!) 22   Ht 5\' 7"  (1.702 m)   Wt 75 kg   SpO2 (!) 89%   BMI 25.90 kg/m  Physical Exam Vitals and nursing note reviewed.  Constitutional:      General: She is not in acute distress.    Appearance: She is well-developed.  HENT:     Head: Normocephalic and atraumatic.  Eyes:     Conjunctiva/sclera: Conjunctivae normal.  Cardiovascular:     Rate and Rhythm: Normal rate and regular rhythm.  Pulmonary:     Effort: Pulmonary effort is normal. Tachypnea present.     Breath sounds: Decreased breath sounds present.  Abdominal:     General: There is no distension.  Skin:    General: Skin is warm and dry.  Neurological:     Mental Status: She is alert and oriented to person, place, and time.     Cranial Nerves: No cranial nerve deficit.  Psychiatric:        Mood and Affect: Mood normal.     ED Results / Procedures / Treatments   Labs (all labs ordered are listed, but only abnormal results are displayed) Labs Reviewed  COMPREHENSIVE METABOLIC PANEL WITH GFR - Abnormal; Notable for the following components:      Result Value   Glucose, Bld 141 (*)    All other components within normal limits  CBC WITH DIFFERENTIAL/PLATELET - Abnormal; Notable for the following components:   RBC 5.24 (*)    MCV 77.9 (*)    MCH 24.4 (*)    RDW 17.7 (*)    All other components within normal limits  RESP PANEL BY RT-PCR (RSV, FLU A&B, COVID)  [id number]    EKG EKG Interpretation Date/Time:  Friday Jan 17 2024 13:20:04 EDT Ventricular Rate:  83 PR Interval:  176 QRS Duration:  82 QT Interval:  376 QTC Calculation: 441 R Axis:   -50  Text Interpretation: Normal sinus rhythm Left axis deviation  Low voltage QRS Confirmed by Dorenda Gandy [phone]) on 01/17/2024 5:16:59 PM  Radiology DG Chest 2 View Result Date: 01/17/2024 CLINICAL DATA:  SOB, cough EXAM: CHEST - 2 VIEW COMPARISON:  August 06, 2023 FINDINGS: No focal airspace consolidation, pleural effusion, or pneumothorax. No cardiomegaly. No acute fracture or destructive lesion. Multilevel thoracic osteophytosis. IMPRESSION: No acute cardiopulmonary abnormality. Electronically Signed   By: Rance Burrows M.D.   On: 01/17/2024 16:46    Procedures Procedures    Medications Ordered in ED Medications  sodium chloride  0.9 % bolus 500 mL (0 mLs Intravenous Stopped 01/17/24 1727)  methylPREDNISolone  sodium succinate (SOLU-MEDROL ) 125 mg/2 mL injection 125 mg (125  mg Intravenous Given 01/17/24 1549)  albuterol  (PROVENTIL ) (2.5 MG/3ML) 0.083% nebulizer solution (10 mg/hr Nebulization Given 01/17/24 1534)  ipratropium-albuterol  (DUONEB) 0.5-2.5 (3) MG/3ML nebulizer solution 3 mL (3 mLs Nebulization Given 01/17/24 1748)    ED Course/ Medical Decision Making/ A&P                                 Medical Decision Making Adult female presents with concern for shortness of breath, on exam has diminished breath sounds, tachypneic.  Patient has had minimal risk profile for PE, no stigmata, including chest pain, asymmetric lower extremity edema, concern for pneumonia versus COPD versus bacteremia, sepsis. Less likely ACS with no chest pain, nonischemic EKG. Pulse ox 94% borderline Cardiac 85 sinus normal  Amount and/or Complexity of Data Reviewed Independent Historian: parent Labs: ordered. Decision-making details documented in ED Course. Radiology: ordered and independent interpretation performed. Decision-making details documented in ED Course. ECG/medicine tests: ordered and independent interpretation performed. Decision-making details documented in ED Course.  Risk Prescription drug management. Decision regarding  hospitalization. Diagnosis or treatment significantly limited by social determinants of health.   6:52 PM Patient markedly improved after continuous neb, DuoNeb.  No hypoxia, no increased work of breathing, she is smiling.  No evidence for pneumonia, bacteremia, sepsis patient discharged in stable condition. Final Clinical Impression(s) / ED Diagnoses Final diagnoses:  Shortness of breath    Rx / DC Orders ED Discharge Orders          Ordered    predniSONE  (DELTASONE ) 20 MG tablet  Daily with breakfast        01/17/24 1852              Dorenda Gandy, MD 01/17/24 [phone]

## 2024-01-17 NOTE — ED Notes (Signed)
 Pt assisted to bathroom

## 2024-01-17 NOTE — Discharge Instructions (Addendum)
 For the next 2 days please use your albuterol  every 4 hours.  You may then use it as needed.  Return here for concerning changes in your condition.

## 2024-01-17 NOTE — ED Triage Notes (Signed)
 Pt arrived via POV c/o SOB and a cough for apprx a week and a half. Pt reports using her Neb treatments and inhaler at home w/o relief.

## 2024-01-27 ENCOUNTER — Encounter (INDEPENDENT_AMBULATORY_CARE_PROVIDER_SITE_OTHER): Payer: Self-pay | Admitting: Otolaryngology

## 2024-01-27 ENCOUNTER — Ambulatory Visit (INDEPENDENT_AMBULATORY_CARE_PROVIDER_SITE_OTHER): Admitting: Otolaryngology

## 2024-01-27 ENCOUNTER — Other Ambulatory Visit (HOSPITAL_COMMUNITY): Payer: Self-pay | Admitting: Otolaryngology

## 2024-01-27 DIAGNOSIS — J42 Unspecified chronic bronchitis: Secondary | ICD-10-CM | POA: Diagnosis not present

## 2024-01-27 DIAGNOSIS — K219 Gastro-esophageal reflux disease without esophagitis: Secondary | ICD-10-CM

## 2024-01-27 DIAGNOSIS — Z87891 Personal history of nicotine dependence: Secondary | ICD-10-CM | POA: Diagnosis not present

## 2024-01-27 DIAGNOSIS — R059 Cough, unspecified: Secondary | ICD-10-CM

## 2024-01-27 DIAGNOSIS — R1310 Dysphagia, unspecified: Secondary | ICD-10-CM

## 2024-01-27 MED ORDER — FAMOTIDINE 20 MG PO TABS: 20.0000 mg | ORAL_TABLET | Freq: Two times a day (BID) | ORAL | 3 refills | Status: AC

## 2024-01-27 NOTE — Patient Instructions (Signed)

## 2024-01-27 NOTE — Progress Notes (Signed)
 ENT CONSULT:  Reason for Consult: dysphagia    HPI: Discussed the use of AI scribe software for clinical note transcription with the patient, who gave verbal consent to proceed.  History of Present Illness  Discussed the use of AI scribe software for clinical note transcription with the patient, who gave verbal consent to proceed.  History of Present Illness   Amy Cobb is a 63 year old female who presents with difficulty swallowing.  She has been experiencing difficulty swallowing since January, following an episode of pneumonia, treated outpatient. The sensation is described as if something is stuck when swallowing pills, causing significant discomfort. She has resorted to cutting her pills in half due to this issue. No coughing or choking on solid foods and liquids is reported.  In January, she was diagnosed with pneumonia in her left lung, treated with antibiotics and steroids, without hospitalization. Since then, she has had persistent respiratory issues. She has a history of chronic bronchitis and recently completed a course of steroids but still feels tightness in her chest. She uses a nebulizer and an inhaler for her respiratory condition.  She occasionally experiences heartburn, though it is not frequent. She was previously prescribed medication for heartburn several years ago, which resolved her symptoms. She is not currently taking Pepcid . Recently, she has noticed an increase in saliva production. No history of strokes is reported.      Records Reviewed:  ED visit for dyspnea 01/17/24  Patient presents with shortness of breath. Patient has pulmonary disease, chronic bronchitis versus mild COPD. She uses albuterol , nebulizer, inhaler regularly. Over the past 2 days, weeks she has had increasing dyspnea. No fever, no chest pain, no abdominal pain. No relief with medications. She is here with her mother who assists with the history.   Sent home on prednisone     Past Medical  History:  Diagnosis Date   Acid reflux    Anxiety    Asthma    COPD (chronic obstructive pulmonary disease) (HCC)    Depression    Essential hypertension    OSA on CPAP    Type 2 diabetes mellitus (HCC)    Vulvar cancer (HCC) 2003    Past Surgical History:  Procedure Laterality Date   ABDOMINAL HYSTERECTOMY     BREAST CYST EXCISION Right    CERVICAL FUSION     CO2 LASER APPLICATION  2003   CO2 LASER VAPORIZATION OF THE VULVAR LESION   NASAL SEPTOPLASTY W/ TURBINOPLASTY N/A 02/17/2020   Procedure: BILATERAL NASAL SEPTOPLASTY;  Surgeon: Reynold Caves, MD;  Location: [facility] OR;  Service: ENT;  Laterality: N/A;   SHOULDER SURGERY     TURBINATE REDUCTION Bilateral 02/17/2020   Procedure: BILATERAL TURBINATE REDUCTION;  Surgeon: Reynold Caves, MD;  Location: [facility] OR;  Service: ENT;  Laterality: Bilateral;    Family History  Problem Relation Age of Onset   Hypertension Mother    Hypertension Sister    Breast cancer Sister 33   Diabetes Sister    Breast cancer Cousin        several cousins   Diabetes Cousin     Social History:  reports that she quit smoking about 15 years ago. Her smoking use included cigarettes. She has never used smokeless tobacco. She reports current alcohol use. She reports that she does not use drugs.  Allergies:  Allergies  Allergen Reactions   Augmentin [Amoxicillin-Pot Clavulanate] Anaphylaxis and Other (See Comments)    Hallucinations.   Penicillins Anaphylaxis and Nausea And Vomiting  Hallucinations.   Clavulanic Acid    Gabapentin     Hallucinate    Moxifloxacin Hives   Novolog  [Insulin  Aspart (Human Analog)] Rash    Medications: I have reviewed the patient's current medications.  The PMH, PSH, Medications, Allergies, and SH were reviewed and updated.  ROS: Constitutional: Negative for fever, weight loss and weight gain. Cardiovascular: Negative for chest pain and dyspnea on exertion. Respiratory: Is not experiencing shortness of breath at  rest. Gastrointestinal: Negative for nausea and vomiting. Neurological: Negative for headaches. Psychiatric: The patient is not nervous/anxious  There were no vitals taken for this visit. There is no height or weight on file to calculate BMI.  PHYSICAL EXAM:  Exam: General: Well-developed, well-nourished Communication and Voice: Clear pitch and clarity Respiratory Respiratory effort: Equal inspiration and expiration without stridor Cardiovascular Peripheral Vascular: Warm extremities with equal color/perfusion Eyes: No nystagmus with equal extraocular motion bilaterally Neuro/Psych/Balance: Patient oriented to person, place, and time; Appropriate mood and affect; Gait is intact with no imbalance; Cranial nerves I-XII are intact Head and Face Inspection: Normocephalic and atraumatic without mass or lesion Palpation: Facial skeleton intact without bony stepoffs Salivary Glands: No mass or tenderness Facial Strength: Facial motility symmetric and full bilaterally ENT Pinna: External ear intact and fully developed External canal: Canal is patent with intact skin Tympanic Membrane: Clear and mobile External Nose: No scar or anatomic deformity Internal Nose: Septum is relatively straight. No polyp, or purulence. Mucosal edema and erythema present.  Bilateral inferior turbinate hypertrophy.  Lips, Teeth, and gums: Mucosa and teeth intact and viable TMJ: No pain to palpation with full mobility Oral cavity/oropharynx: No erythema or exudate, no lesions present Nasopharynx: No mass or lesion with intact mucosa Hypopharynx: Intact mucosa without pooling of secretions Larynx Glottic: Full true vocal cord mobility without lesion or mass Supraglottic: Normal appearing epiglottis and AE folds Interarytenoid Space: Moderate pachydermia&edema Subglottic Space: Patent without lesion or edema Neck Neck and Trachea: Midline trachea without mass or lesion Thyroid : No mass or  nodularity Lymphatics: No lymphadenopathy  Procedure: Preoperative diagnosis: dysphagia   Postoperative diagnosis:   Same  Procedure: Flexible fiberoptic laryngoscopy  Surgeon: Artice Last, MD  Anesthesia: Topical lidocaine  and Afrin Complications: None Condition is stable throughout exam  Indications and consent:  The patient presents to the clinic with above symptoms. Indirect laryngoscopy view was incomplete. Thus it was recommended that they undergo a flexible fiberoptic laryngoscopy. All of the risks, benefits, and potential complications were reviewed with the patient preoperatively and verbal informed consent was obtained.  Procedure: The patient was seated upright in the clinic. Topical lidocaine  and Afrin were applied to the nasal cavity. After adequate anesthesia had occurred, I then proceeded to pass the flexible telescope into the nasal cavity. The nasal cavity was patent without rhinorrhea or polyp. The nasopharynx was also patent without mass or lesion. The base of tongue was visualized and was normal. There were no signs of pooling of secretions in the piriform sinuses. The true vocal folds were mobile bilaterally. There were no signs of glottic or supraglottic mucosal lesion or mass. There was moderate interarytenoid pachydermia and post cricoid edema. The telescope was then slowly withdrawn and the patient tolerated the procedure throughout.      Studies Reviewed: CXR 01/17/24 FINDINGS: No focal airspace consolidation, pleural effusion, or pneumothorax. No cardiomegaly. No acute fracture or destructive lesion. Multilevel thoracic osteophytosis.   IMPRESSION: No acute cardiopulmonary abnormality.  Assessment/Plan: Encounter Diagnoses  Name Primary?   Dysphagia, unspecified type  Yes   Chronic GERD     Assessment and Plan Assessment & Plan     Update 01/27/2024 Assessment and Plan    Dysphagia Dysphagia post-pneumonia with sensation of pills getting  stuck. Possible silent reflux causing upper esophageal sphincter tightening vs OP vs esophageal dysphagia. Flexible scope exam is reassuring. No pooling of secretions in pyriforms. Moderate post-cricoid edema pachydermia c/w GERD LPR  - Order swallow test through radiology to assess swallowing and screen esophagus. MBS and esophagram. - Provided information on managing reflux in after visit summary. - Schedule follow-up after swallow test results.  GERD LPR - Pepcid  20 mg BID  -  Reflux Gourmet after meals - diet and lifestyle changes to minimize GERD - Refer to [employer] blog for dietary and lifestyle modifications/reflux cook book.  Chronic bronchitis Managed with steroids and inhaler. Reports tightness, may require additional steroids. - Continue current management with nebulizer and inhaler.      MBS esophagram and RTC 3 mo  Thank you for allowing me to participate in the care of this patient. Please do not hesitate to contact me with any questions or concerns.   Artice Last, MD Otolaryngology [facility] Phone: [phone] Fax: [phone]    01/27/2024, 10:07 AM

## 2024-02-07 ENCOUNTER — Ambulatory Visit (HOSPITAL_COMMUNITY)
Admission: RE | Admit: 2024-02-07 | Discharge: 2024-02-07 | Disposition: A | Discharge status: Home / Self Care | Source: Ambulatory Visit | Attending: Internal Medicine | Admitting: Internal Medicine

## 2024-02-07 ENCOUNTER — Encounter (HOSPITAL_COMMUNITY): Payer: Self-pay

## 2024-02-07 ENCOUNTER — Ambulatory Visit (HOSPITAL_COMMUNITY)
Admission: RE | Admit: 2024-02-07 | Discharge: 2024-02-07 | Disposition: A | Discharge status: Home / Self Care | Source: Ambulatory Visit | Attending: Otolaryngology | Admitting: Otolaryngology

## 2024-02-07 DIAGNOSIS — R059 Cough, unspecified: Secondary | ICD-10-CM

## 2024-02-07 DIAGNOSIS — R1310 Dysphagia, unspecified: Secondary | ICD-10-CM

## 2024-02-07 DIAGNOSIS — R1314 Dysphagia, pharyngoesophageal phase: Secondary | ICD-10-CM | POA: Diagnosis not present

## 2024-02-07 NOTE — Progress Notes (Signed)
 Modified Barium Swallow Study  Patient Details  Name: Amy Cobb MRN: [id number] Date of Birth: 06/29/1961  Today's Date: 02/07/2024  Modified Barium Swallow completed.  Full report located under Chart Review in the Imaging Section.  History of Present Illness Amy Cobb is a 63 year old femalearriving for an OP MBS, referred by ENT due to complaint of difficulty swallowing since January following an episode of pneumonia, treated outpatient. Since then she has had persistent respiratory issues and a sensation as if something is stuck when swallowing pills,  Pt denies coughing or choking on solid foods and liquids. Pt reported mild history of heartburn. Only significant finding on laryngoscopy was moderate interarytenoid pachydermia and post cricoid edema. ENT suspected silent reflux causing upper esophageal sphincter tightening vd oropharyngeal or esophageal dysphagia. Pt prescribed Pepcid  and other intervetions to minimize GERD.   Clinical Impression Pt demonstrates normal swallow function except that the barium tablet, taken with water, lodged at C5/6. At this spot there is the appearance of bony protrusion, possible osteophyte. It is also possible that this is a stricture. When pill lodged, liquid passed. SLP offered several trials of puree which slowly carried pill through the esophagus. Pt was in distress when pill stopped, but felt relief as it passed. Video biofeedback provided to show pt clearance. Offered several compensations. Pt unfortunately did not complete esophagram after MBS due to technical issues with Esophagram equipment. Pt may choose to return to complete exam. Would recommend f/u with GI to rule out stricture. Factors that may increase risk of adverse event in presence of aspiration Roderick Civatte & Jessy Morocco 2021):    Swallow Evaluation Recommendations Recommendations: PO diet PO Diet Recommendation: Regular;Thin liquids (Level 0) Liquid Administration via:  Straw Medication Administration: Whole meds with puree Supervision: Patient able to self-feed Swallowing strategies  : Small bites/sips;Follow solids with liquids Postural changes: Position pt fully upright for meals Recommended consults: Consider GI consultation;Consider esophageal assessment      Ena Demary, Hardin Leys 02/07/2024,2:33 PM

## 2024-02-13 DIAGNOSIS — Z6826 Body mass index (BMI) 26.0-26.9, adult: Secondary | ICD-10-CM | POA: Diagnosis not present

## 2024-02-13 DIAGNOSIS — J449 Chronic obstructive pulmonary disease, unspecified: Secondary | ICD-10-CM | POA: Diagnosis not present

## 2024-02-14 ENCOUNTER — Other Ambulatory Visit (HOSPITAL_COMMUNITY)

## 2024-02-26 ENCOUNTER — Encounter: Payer: Self-pay | Admitting: Student

## 2024-02-26 ENCOUNTER — Ambulatory Visit: Admitting: Student

## 2024-02-26 ENCOUNTER — Ambulatory Visit: Attending: Student

## 2024-02-26 ENCOUNTER — Ambulatory Visit: Attending: Student | Admitting: Student

## 2024-02-26 VITALS — BP 122/60 | HR 85 | Ht 67.0 in | Wt 167.6 lb

## 2024-02-26 DIAGNOSIS — I10 Essential (primary) hypertension: Secondary | ICD-10-CM

## 2024-02-26 DIAGNOSIS — R002 Palpitations: Secondary | ICD-10-CM

## 2024-02-26 DIAGNOSIS — E782 Mixed hyperlipidemia: Secondary | ICD-10-CM | POA: Diagnosis not present

## 2024-02-26 DIAGNOSIS — Z87898 Personal history of other specified conditions: Secondary | ICD-10-CM | POA: Diagnosis not present

## 2024-02-26 MED ORDER — ROSUVASTATIN CALCIUM 5 MG PO TABS: 5.0000 mg | ORAL_TABLET | ORAL | 3 refills | Status: DC

## 2024-02-26 MED ORDER — ROSUVASTATIN CALCIUM 5 MG PO TABS: 5.0000 mg | ORAL_TABLET | ORAL | 3 refills | Status: AC

## 2024-02-26 NOTE — Patient Instructions (Signed)
 Medication Instructions:  Your physician has recommended you make the following change in your medication:   Restart Crestor  5 mg Weekly   *If you need a refill on your cardiac medications before your next appointment, please call your pharmacy*  Lab Work: NONE   If you have labs (blood work) drawn today and your tests are completely normal, you will receive your results only by: MyChart Message (if you have MyChart) OR A paper copy in the mail If you have any lab test that is abnormal or we need to change your treatment, we will call you to review the results.  Testing/Procedures: NONE   Follow-Up: At [facility], you and your health needs are our priority.  As part of our continuing mission to provide you with exceptional heart care, our providers are all part of one team.  This team includes your primary Cardiologist (physician) and Advanced Practice Providers or APPs (Physician Assistants and Nurse Practitioners) who all work together to provide you with the care you need, when you need it.  Your next appointment:   6 month(s)  Provider:   You may see Jayson Sierras, MD or one of the following Advanced Practice Providers on your designated Care Team:   Laymon Qua, PA-C  [place], [place] Olivia [place], [place]     We recommend signing up for the patient portal called MyChart.  Sign up information is provided on this After Visit Summary.  MyChart is used to connect with patients for Virtual Visits (Telemedicine).  Patients are able to view lab/test results, encounter notes, upcoming appointments, etc.  Non-urgent messages can be sent to your provider as well.   To learn more about what you can do with MyChart, go to [email or web address].   Other Instructions Thank you for choosing [facility]!

## 2024-02-26 NOTE — Progress Notes (Signed)
 Cardiology Office Note    Date:  02/26/2024  ID:  Amy Cobb, DOB Jun 11, 1961, MRN [id number] Cardiologist: Jayson Sierras, MD    History of Present Illness:    Amy Cobb is a 63 y.o. female with past medical history of HTN, hypertensive heart disease, Type II DM, asthma and OSA (intolerant to CPAP) who presents to the office today for 87-month follow-up.  She was last examined by Dr. Sierras in 01/2023 and denied any recent anginal symptoms or palpitations at that time. She reported a history of statin myalgias and LDL was at 99 on most recent check. It was recommended she try Crestor  5 mg daily and if able to tolerate, would recheck an FLP and LFT's at her next visit.  In talking with the patient today, she denies any recent chest pain. She does experience intermittent dyspnea in the setting of asthma and has experienced a few exacerbations over the past several months. Reports her inhalers were recently transitioned and she has noticed improvement with this. She also reports intermittent palpitations over the past few weeks. She has not noticed an association between these and when she uses her inhaler or nebulizer. Symptoms last for several minutes and she reports her heart rate can be in the low 100's while resting. She does not consume caffeine and reports only rare alcohol use. Mostly consumes water during the day. No recent PND or lower extremity edema. She did not start Crestor  at the time of her last office visit as previously discussed as she did not wish to take another medication on a daily basis. She has been under increased stress for the past year as her brother passed away last year and her sister passed away from complications of a cardiac arrest earlier this year.  Studies Reviewed:   EKG: EKG is not ordered today.  NST: 04/2021   Lexiscan  stress is electrically negative for ischemia   Myoview  shows normal perfusion  No ischemia   LVEf is normal at 67%   Low risk  study  Limited Echo: 05/2022 IMPRESSIONS     1. Left ventricular ejection fraction, by estimation, is 65 to 70%. The  left ventricle has normal function. Left ventricular diastolic parameters  are consistent with Grade I diastolic dysfunction (impaired relaxation).   2. Right ventricular systolic function is normal. The right ventricular  size is normal.   3. There is no evidence of cardiac tamponade.Trivial circumferential  pericardial effusion   4. The inferior vena cava is normal in size with greater than 50%  respiratory variability, suggesting right atrial pressure of 3 mmHg.   5. Limited echo to evaluate pericardial effusion    Physical Exam:   VS:  BP 122/60 (BP Location: Left Arm, Cuff Size: Normal)   Pulse 85   Ht 5' 7 (1.702 m)   Wt 167 lb 9.6 oz (76 kg)   SpO2 98%   BMI 26.25 kg/m    Wt Readings from Last 3 Encounters:  02/26/24 167 lb 9.6 oz (76 kg)  01/17/24 165 lb 5.5 oz (75 kg)  10/22/23 165 lb 6.4 oz (75 kg)     GEN: Well nourished, well developed female appearing in no acute distress NECK: No JVD; No carotid bruits CARDIAC: RRR, no murmurs, rubs, gallops RESPIRATORY:  Clear to auscultation without rales, wheezing or rhonchi  ABDOMEN: Appears non-distended. No obvious abdominal masses. EXTREMITIES: No clubbing or cyanosis. No pitting edema.  Distal pedal pulses are 2+ bilaterally.   Assessment and  Plan:   1. Palpitations - She does report frequent palpitations over the past few weeks and symptoms can last for several minutes at a time. Will plan to obtain a 1 week Zio patch to rule out any significant arrhythmias. Continue Labetalol  400 mg twice daily for now.  2. Essential hypertension - BP is well-controlled at 122/60 during today's visit. Continue current medical therapy with Chlorthalidone  25 mg daily, Clonidine  0.2 mg 3 times daily, Lasix 20 mg as needed (does not use this routinely), Hydralazine  50 mg 3 times daily, Labetalol  400 mg twice daily  and Losartan  100 mg daily. Creatinine was stable at 0.77 when checked in 12/2023.  3. Mixed hyperlipidemia - LDL was at 99 when checked last year and as discussed above, she did not start Crestor  5 mg daily. We reviewed options and she prefers to try taking this once weekly. If able to tolerate, would recheck an FLP and LFT's in 3 months for reassessment.   4. History of chest pain - She has a history of chest pain and most recent ischemic evaluation was a low-risk NST in 04/2021. She remains active at baseline and denies any recent anginal symptoms. We reviewed warning signs to monitor for.  Continue with risk factor modification. Will plan to restart statin therapy as outlined above.   Signed, Laymon CHRISTELLA Qua, PA-C

## 2024-02-27 DIAGNOSIS — Z6825 Body mass index (BMI) 25.0-25.9, adult: Secondary | ICD-10-CM | POA: Diagnosis not present

## 2024-02-27 DIAGNOSIS — E7849 Other hyperlipidemia: Secondary | ICD-10-CM | POA: Diagnosis not present

## 2024-02-27 DIAGNOSIS — J449 Chronic obstructive pulmonary disease, unspecified: Secondary | ICD-10-CM | POA: Diagnosis not present

## 2024-02-27 DIAGNOSIS — I10 Essential (primary) hypertension: Secondary | ICD-10-CM | POA: Diagnosis not present

## 2024-02-27 DIAGNOSIS — K219 Gastro-esophageal reflux disease without esophagitis: Secondary | ICD-10-CM | POA: Diagnosis not present

## 2024-02-27 DIAGNOSIS — G4739 Other sleep apnea: Secondary | ICD-10-CM | POA: Diagnosis not present

## 2024-02-27 DIAGNOSIS — E1122 Type 2 diabetes mellitus with diabetic chronic kidney disease: Secondary | ICD-10-CM | POA: Diagnosis not present

## 2024-02-27 DIAGNOSIS — F411 Generalized anxiety disorder: Secondary | ICD-10-CM | POA: Diagnosis not present

## 2024-02-27 DIAGNOSIS — N182 Chronic kidney disease, stage 2 (mild): Secondary | ICD-10-CM | POA: Diagnosis not present

## 2024-02-28 ENCOUNTER — Other Ambulatory Visit (HOSPITAL_COMMUNITY): Payer: Self-pay | Admitting: Internal Medicine

## 2024-02-28 DIAGNOSIS — M810 Age-related osteoporosis without current pathological fracture: Secondary | ICD-10-CM

## 2024-03-05 ENCOUNTER — Other Ambulatory Visit: Payer: Self-pay | Admitting: Cardiology

## 2024-03-06 ENCOUNTER — Encounter: Payer: Self-pay | Admitting: Advanced Practice Midwife

## 2024-03-10 DIAGNOSIS — R002 Palpitations: Secondary | ICD-10-CM | POA: Diagnosis not present

## 2024-03-11 ENCOUNTER — Ambulatory Visit: Payer: Self-pay | Admitting: Student

## 2024-03-11 DIAGNOSIS — R002 Palpitations: Secondary | ICD-10-CM | POA: Diagnosis not present

## 2024-03-18 DIAGNOSIS — E113313 Type 2 diabetes mellitus with moderate nonproliferative diabetic retinopathy with macular edema, bilateral: Secondary | ICD-10-CM | POA: Diagnosis not present

## 2024-03-21 ENCOUNTER — Other Ambulatory Visit: Payer: Self-pay

## 2024-03-21 ENCOUNTER — Emergency Department (HOSPITAL_COMMUNITY)
Admission: EM | Admit: 2024-03-21 | Discharge: 2024-03-21 | Disposition: A | Discharge status: Home / Self Care | Attending: Emergency Medicine | Admitting: Emergency Medicine

## 2024-03-21 DIAGNOSIS — Z794 Long term (current) use of insulin: Secondary | ICD-10-CM | POA: Insufficient documentation

## 2024-03-21 DIAGNOSIS — E119 Type 2 diabetes mellitus without complications: Secondary | ICD-10-CM | POA: Insufficient documentation

## 2024-03-21 DIAGNOSIS — S70361A Insect bite (nonvenomous), right thigh, initial encounter: Secondary | ICD-10-CM | POA: Diagnosis not present

## 2024-03-21 DIAGNOSIS — W57XXXA Bitten or stung by nonvenomous insect and other nonvenomous arthropods, initial encounter: Secondary | ICD-10-CM | POA: Insufficient documentation

## 2024-03-21 DIAGNOSIS — S80862A Insect bite (nonvenomous), left lower leg, initial encounter: Secondary | ICD-10-CM | POA: Diagnosis not present

## 2024-03-21 DIAGNOSIS — Z7984 Long term (current) use of oral hypoglycemic drugs: Secondary | ICD-10-CM | POA: Diagnosis not present

## 2024-03-21 DIAGNOSIS — S90561A Insect bite (nonvenomous), right ankle, initial encounter: Secondary | ICD-10-CM | POA: Insufficient documentation

## 2024-03-21 MED ORDER — PREDNISONE 20 MG PO TABS: 40.0000 mg | ORAL_TABLET | Freq: Every day | ORAL | 0 refills | Status: DC

## 2024-03-21 NOTE — ED Triage Notes (Signed)
 Pt was bite by a bug of some sort and is now experiencing SOB and tingling near the bite.

## 2024-03-21 NOTE — ED Provider Notes (Signed)
 [facility] AT [facility] Provider Note   CSN: [id number] Arrival date & time: 03/21/24  1554     Patient presents with: Allergic Reaction   Amy Cobb is a 63 y.o. female.    Allergic Reaction  This patient is a 63 year old female, she is a diabetic, she presents with 2 red spots on her body, 1 on the right lateral thigh up near the buttock and 1 on the inside of the left ankle.  She woke up this morning with these areas of redness which were itchy and slightly tender, no fever no shortness of breath, she states she has chronic asthma but has no differences in her chronic symptoms.  She has no swelling of her throat and no urticaria.  She has used topical hydrocortisone cream but felt like her foot was a little bit tingly so she stopped using it.  No other symptoms, no history of severe allergies.    Prior to Admission medications   Medication Sig Start Date End Date Taking? Authorizing Provider  predniSONE  (DELTASONE ) 20 MG tablet Take 2 tablets (40 mg total) by mouth daily. 03/21/24  Yes Cleotilde Rogue, MD  Accu-Chek FastClix Lancets MISC Apply topically. 12/11/22   [provider]  Aflibercept  (EYLEA ) 2 MG/0.05ML SOLN 1 each by Intravitreal route See admin instructions. Every 5 weeks    [provider]  albuterol  (PROVENTIL ) (2.5 MG/3ML) 0.083% nebulizer solution Take 2.5 mg by nebulization every 8 (eight) hours as needed for wheezing or shortness of breath.     [provider]  albuterol  (VENTOLIN  HFA) 108 (90 Base) MCG/ACT inhaler Inhale 2 puffs into the lungs every 4 (four) hours as needed for wheezing or shortness of breath. 04/07/21   Jude Harden GAILS, MD  Alcohol Swabs (B-D SINGLE USE SWABS REGULAR) PADS  03/07/20   [provider]  benzonatate  (TESSALON ) 100 MG capsule Take 1 capsule (100 mg total) by mouth 3 (three) times daily as needed for cough. 03/02/23   Suzette Pac, MD  Blood Glucose Monitoring Suppl  (ACCU-CHEK GUIDE ME) w/Device KIT  09/18/22   [provider]  chlorthalidone  (HYGROTON ) 25 MG tablet Take 25 mg by mouth daily.    [provider]  cloNIDine  (CATAPRES ) 0.2 MG tablet Take 1 tablet (0.2 mg total) by mouth 3 (three) times daily. 01/21/18   Strader, Laymon CHRISTELLA, PA-C  cyanocobalamin (,VITAMIN B-12,) 1000 MCG/ML injection Inject 1,000 mcg as directed every 30 (thirty) days.    [provider]  DROPLET PEN NEEDLES 32G X 4 MM MISC  08/24/20   [provider]  famotidine  (PEPCID ) 20 MG tablet Take 20 mg by mouth at bedtime.    [provider]  famotidine  (PEPCID ) 20 MG tablet Take 1 tablet (20 mg total) by mouth 2 (two) times daily. 01/27/24   Soldatova, Liuba, MD  fexofenadine (ALLEGRA) 180 MG tablet as needed. 08/28/22   [provider]  fluticasone  (FLONASE ) 50 MCG/ACT nasal spray Place 1 spray into both nostrils daily.    [provider]  fluticasone  (FLONASE ) 50 MCG/ACT nasal spray Place 1 spray into both nostrils daily. 08/22/22   Haviland, Julie, MD  furosemide (LASIX) 20 MG tablet Take 20 mg by mouth 3 (three) times daily. prn 11/12/23   [provider]  glimepiride (AMARYL) 4 MG tablet  08/15/20   [provider]  hydrALAZINE  (APRESOLINE ) 50 MG tablet TAKE 1 TABLET THREE TIMES DAILY 03/05/24   Debera Jayson MATSU, MD  insulin   glargine (LANTUS ) 100 UNIT/ML injection Inject 50 Units into the skin 2 (two) times daily.    [provider]  insulin  lispro (HUMALOG ) 100 UNIT/ML KwikPen Inject 25 Units into the skin 3 (three) times daily.    [provider]  labetalol  (NORMODYNE ) 200 MG tablet TAKE 2 TABLETS TWICE DAILY 01/03/24   Debera Jayson MATSU, MD  losartan  (COZAAR ) 100 MG tablet TAKE 1 TABLET EVERY DAY (NEED MD APPOINTMENT) 01/08/24   Debera Jayson MATSU, MD  minoxidil  (LONITEN ) 2.5 MG tablet Take 2.5 mg by mouth daily.    [provider]  potassium chloride  SA (KLOR-CON  M) 20 MEQ tablet  Take 1 tablet (20 mEq total) by mouth daily. 08/07/23   Parrett, Madelin RAMAN, NP  promethazine -dextromethorphan (PROMETHAZINE -DM) 6.25-15 MG/5ML syrup Take 5 mLs by mouth every 6 (six) hours as needed for cough. 08/22/22   [provider]  rosuvastatin  (CRESTOR ) 5 MG tablet Take 1 tablet (5 mg total) by mouth once a week. 02/26/24 05/26/24  Laubach Laymon HERO, PA-C    Allergies: Augmentin [amoxicillin-pot clavulanate], Penicillins, Clavulanic acid, Gabapentin, Moxifloxacin, and Novolog  [insulin  aspart (human analog) (yeast)]    Review of Systems  All other systems reviewed and are negative.   Updated Vital Signs BP (!) 174/81   Pulse 86   Temp 98.3 F (36.8 C) (Oral)   Ht 1.702 m (5' 7)   Wt 74.4 kg   SpO2 95%   BMI 25.69 kg/m   Physical Exam Vitals and nursing note reviewed.  Constitutional:      General: She is not in acute distress.    Appearance: She is well-developed.  HENT:     Head: Normocephalic and atraumatic.     Mouth/Throat:     Pharynx: No oropharyngeal exudate.  Eyes:     General: No scleral icterus.       Right eye: No discharge.        Left eye: No discharge.     Conjunctiva/sclera: Conjunctivae normal.     Pupils: Pupils are equal, round, and reactive to light.  Neck:     Thyroid : No thyromegaly.     Vascular: No JVD.  Cardiovascular:     Rate and Rhythm: Normal rate and regular rhythm.     Heart sounds: Normal heart sounds. No murmur heard.    No friction rub. No gallop.  Pulmonary:     Effort: Pulmonary effort is normal. No respiratory distress.     Breath sounds: Normal breath sounds. No wheezing or rales.  Abdominal:     General: Bowel sounds are normal. There is no distension.     Palpations: Abdomen is soft. There is no mass.     Tenderness: There is no abdominal tenderness.  Musculoskeletal:        General: No tenderness. Normal range of motion.     Cervical back: Normal range of motion and neck supple.  Lymphadenopathy:     Cervical:  No cervical adenopathy.  Skin:    General: Skin is warm and dry.     Findings: Rash present. No erythema.  Neurological:     Mental Status: She is alert.     Coordination: Coordination normal.  Psychiatric:        Behavior: Behavior normal.     (all labs ordered are listed, but only abnormal results are displayed) Labs Reviewed - No data to display  EKG: None  Radiology: No results found.   Procedures   Medications Ordered in the ED - No  data to display                                  Medical Decision Making Risk Prescription drug management.    Approximately 2 cm in diameter areas of erythema to the left internal ankle in the right lateral thigh, these areas appear benign, she has some pruritus with this suggestive of an insect bite, no signs of urticaria on the whole body, no signs of throat swelling or shortness of breath, vitals are unremarkable, no hypoxia no tachycardia no hypotension no gastrointestinal symptoms.  The patient is definitely stable for discharge and was given precautions for local insect bite precautions.  She is agreeable to the plan and stable for discharge     Final diagnoses:  Insect bite, unspecified site, initial encounter    ED Discharge Orders          Ordered    predniSONE  (DELTASONE ) 20 MG tablet  Daily        03/21/24 1618               Cleotilde Rogue, MD 03/21/24 1620

## 2024-03-21 NOTE — Discharge Instructions (Signed)
 You have suffered what appears to be local insect bites.  This should get better over the next several days but do not be surprised if there is redness pain and itching at the sites.  You can use topical hydrocortisone cream, Benadryl  every 6 hours as needed and only for severe symptoms when I take the prednisone .  If you do develop fevers or severe symptoms return to the emergency department.

## 2024-03-23 ENCOUNTER — Ambulatory Visit (HOSPITAL_COMMUNITY)
Admission: RE | Admit: 2024-03-23 | Discharge: 2024-03-23 | Disposition: A | Discharge status: Home / Self Care | Source: Ambulatory Visit | Attending: Internal Medicine | Admitting: Internal Medicine

## 2024-03-23 DIAGNOSIS — M810 Age-related osteoporosis without current pathological fracture: Secondary | ICD-10-CM | POA: Insufficient documentation

## 2024-03-23 DIAGNOSIS — M85851 Other specified disorders of bone density and structure, right thigh: Secondary | ICD-10-CM | POA: Diagnosis not present

## 2024-03-23 DIAGNOSIS — Z780 Asymptomatic menopausal state: Secondary | ICD-10-CM | POA: Diagnosis not present

## 2024-04-20 ENCOUNTER — Emergency Department (HOSPITAL_COMMUNITY)

## 2024-04-20 ENCOUNTER — Encounter (HOSPITAL_COMMUNITY): Payer: Self-pay | Admitting: Emergency Medicine

## 2024-04-20 ENCOUNTER — Other Ambulatory Visit: Payer: Self-pay

## 2024-04-20 ENCOUNTER — Emergency Department (HOSPITAL_COMMUNITY)
Admission: EM | Admit: 2024-04-20 | Discharge: 2024-04-20 | Disposition: A | Discharge status: Home / Self Care | Attending: Emergency Medicine | Admitting: Emergency Medicine

## 2024-04-20 DIAGNOSIS — E119 Type 2 diabetes mellitus without complications: Secondary | ICD-10-CM | POA: Diagnosis not present

## 2024-04-20 DIAGNOSIS — Z794 Long term (current) use of insulin: Secondary | ICD-10-CM | POA: Insufficient documentation

## 2024-04-20 DIAGNOSIS — Z79899 Other long term (current) drug therapy: Secondary | ICD-10-CM | POA: Diagnosis not present

## 2024-04-20 DIAGNOSIS — I10 Essential (primary) hypertension: Secondary | ICD-10-CM | POA: Insufficient documentation

## 2024-04-20 DIAGNOSIS — Z7951 Long term (current) use of inhaled steroids: Secondary | ICD-10-CM | POA: Insufficient documentation

## 2024-04-20 DIAGNOSIS — M25511 Pain in right shoulder: Secondary | ICD-10-CM | POA: Diagnosis not present

## 2024-04-20 DIAGNOSIS — J449 Chronic obstructive pulmonary disease, unspecified: Secondary | ICD-10-CM | POA: Insufficient documentation

## 2024-04-20 DIAGNOSIS — M19011 Primary osteoarthritis, right shoulder: Secondary | ICD-10-CM | POA: Diagnosis not present

## 2024-04-20 MED ORDER — OXYCODONE-ACETAMINOPHEN 5-325 MG PO TABS
1.0000 | ORAL_TABLET | Freq: Once | ORAL | Status: AC
  Administered 2024-04-20: 1 via ORAL
  Filled 2024-04-20: qty 1

## 2024-04-20 MED ORDER — IBUPROFEN 800 MG PO TABS
800.0000 mg | ORAL_TABLET | Freq: Three times a day (TID) | ORAL | 0 refills | Status: DC
Start: 2024-04-20 — End: 2024-06-11

## 2024-04-20 MED ORDER — OXYCODONE-ACETAMINOPHEN 5-325 MG PO TABS: 1.0000 | ORAL_TABLET | ORAL | 0 refills | Status: DC | PRN

## 2024-04-20 NOTE — Discharge Instructions (Signed)
 You may alternate ice and heat to your shoulder.  As discussed, only use the sling for brief periods of time and only as needed.  You will need to remove the sling and gently rotate your shoulder to avoid frozen shoulder.  Please call your orthopedic provider to arrange follow-up appointment and also listed an orthopedic provider for you in [place] that you may contact to arrange follow-up appointment as well.  Return to the emergency department for any new or worsening symptoms.

## 2024-04-20 NOTE — ED Triage Notes (Signed)
 Pt to the ED with complaints of right shoulder pain that began Saturday Morning.  Pt has had a prior surgery to the shoulder 10 years ago.

## 2024-04-20 NOTE — ED Provider Notes (Signed)
 [facility] AT [facility] Provider Note   CSN: [id number] Arrival date & time: 04/20/24  1245     Patient presents with: Shoulder Pain   Amy Cobb is a 63 y.o. female.    Shoulder Pain Associated symptoms: no fever        Amy Cobb is a 63 y.o. female COPD, DM, HTN who presents to the Emergency Department complaining of right shoulder pain.  Symptoms x 2 days.  States she rolled over in bed and felt a sharp stabbing type pain of her shoulder that radiates to her axilla and she has an occasional, sharp fleeting pain radiate to the fingers of her right hand.  Pain is worse with attempted movement of her arm.  She denies chest pain, shortness of breath, neck pain and numbness of her arm.  No headache, visual changes or dizziness  Prior to Admission medications   Medication Sig Start Date End Date Taking? Authorizing Provider  Accu-Chek FastClix Lancets MISC Apply topically. 12/11/22   [provider]  Aflibercept  (EYLEA ) 2 MG/0.05ML SOLN 1 each by Intravitreal route See admin instructions. Every 5 weeks    [provider]  albuterol  (PROVENTIL ) (2.5 MG/3ML) 0.083% nebulizer solution Take 2.5 mg by nebulization every 8 (eight) hours as needed for wheezing or shortness of breath.     [provider]  albuterol  (VENTOLIN  HFA) 108 (90 Base) MCG/ACT inhaler Inhale 2 puffs into the lungs every 4 (four) hours as needed for wheezing or shortness of breath. 04/07/21   Jude Harden GAILS, MD  Alcohol Swabs (B-D SINGLE USE SWABS REGULAR) PADS  03/07/20   [provider]  benzonatate  (TESSALON ) 100 MG capsule Take 1 capsule (100 mg total) by mouth 3 (three) times daily as needed for cough. 03/02/23   Suzette Pac, MD  Blood Glucose Monitoring Suppl (ACCU-CHEK GUIDE ME) w/Device KIT  09/18/22   [provider]  chlorthalidone  (HYGROTON ) 25 MG tablet Take 25 mg by mouth daily.    [provider]  cloNIDine   (CATAPRES ) 0.2 MG tablet Take 1 tablet (0.2 mg total) by mouth 3 (three) times daily. 01/21/18   Strader, Laymon CHRISTELLA, PA-C  cyanocobalamin (,VITAMIN B-12,) 1000 MCG/ML injection Inject 1,000 mcg as directed every 30 (thirty) days.    [provider]  DROPLET PEN NEEDLES 32G X 4 MM MISC  08/24/20   [provider]  famotidine  (PEPCID ) 20 MG tablet Take 20 mg by mouth at bedtime.    [provider]  famotidine  (PEPCID ) 20 MG tablet Take 1 tablet (20 mg total) by mouth 2 (two) times daily. 01/27/24   Soldatova, Liuba, MD  fexofenadine (ALLEGRA) 180 MG tablet as needed. 08/28/22   [provider]  fluticasone  (FLONASE ) 50 MCG/ACT nasal spray Place 1 spray into both nostrils daily.    [provider]  fluticasone  (FLONASE ) 50 MCG/ACT nasal spray Place 1 spray into both nostrils daily. 08/22/22   Haviland, Julie, MD  furosemide (LASIX) 20 MG tablet Take 20 mg by mouth 3 (three) times daily. prn 11/12/23   [provider]  glimepiride (AMARYL) 4 MG tablet  08/15/20   [provider]  hydrALAZINE  (APRESOLINE ) 50 MG tablet TAKE 1 TABLET THREE TIMES DAILY 03/05/24   Debera Jayson MATSU, MD  insulin  glargine (LANTUS ) 100 UNIT/ML injection Inject 50 Units into the skin 2 (two) times daily.    [provider]  insulin  lispro (HUMALOG ) 100 UNIT/ML KwikPen Inject 25 Units into the  skin 3 (three) times daily.    [provider]  labetalol  (NORMODYNE ) 200 MG tablet TAKE 2 TABLETS TWICE DAILY 01/03/24   Debera Jayson MATSU, MD  losartan  (COZAAR ) 100 MG tablet TAKE 1 TABLET EVERY DAY (NEED MD APPOINTMENT) 01/08/24   Debera Jayson MATSU, MD  minoxidil  (LONITEN ) 2.5 MG tablet Take 2.5 mg by mouth daily.    [provider]  potassium chloride  SA (KLOR-CON  M) 20 MEQ tablet Take 1 tablet (20 mEq total) by mouth daily. 08/07/23   Parrett, Madelin RAMAN, NP  predniSONE  (DELTASONE ) 20 MG tablet Take 2 tablets (40 mg total) by mouth daily. 03/21/24   Cleotilde Rogue, MD  promethazine -dextromethorphan (PROMETHAZINE -DM) 6.25-15 MG/5ML syrup Take 5 mLs by mouth every 6 (six) hours as needed for cough. 08/22/22   [provider]  rosuvastatin  (CRESTOR ) 5 MG tablet Take 1 tablet (5 mg total) by mouth once a week. 02/26/24 05/26/24  Venuto Laymon HERO, PA-C    Allergies: Augmentin [amoxicillin-pot clavulanate], Penicillins, Clavulanic acid, Gabapentin, Moxifloxacin, and Novolog  [insulin  aspart (human analog) (yeast)]    Review of Systems  Constitutional:  Negative for chills and fever.  Gastrointestinal:  Negative for nausea and vomiting.  Musculoskeletal:  Positive for arthralgias (right shoulder pain). Negative for joint swelling.  Skin:  Negative for color change and rash.    Updated Vital Signs BP (!) 143/73   Pulse 79   Temp 98.7 F (37.1 C) (Oral)   Resp 18   Ht 5' 7 (1.702 m)   Wt 74.6 kg   SpO2 96%   BMI 25.76 kg/m   Physical Exam Vitals and nursing note reviewed.  Constitutional:      General: She is not in acute distress.    Appearance: Normal appearance.  Cardiovascular:     Rate and Rhythm: Normal rate and regular rhythm.     Pulses: Normal pulses.  Pulmonary:     Effort: Pulmonary effort is normal.  Musculoskeletal:        General: Tenderness present.     Right shoulder: Tenderness present. No swelling, deformity or crepitus. Decreased range of motion. Normal strength. Normal pulse.     Comments: Pain on ROM of the right shoulder, mostly with abduction.  No edema, erythema or bony deformity  Skin:    General: Skin is warm.     Capillary Refill: Capillary refill takes less than 2 seconds.  Neurological:     Mental Status: She is alert.     (all labs ordered are listed, but only abnormal results are displayed) Labs Reviewed - No data to display  EKG: None  Radiology: DG Shoulder Right Result Date: 04/20/2024 CLINICAL DATA:  Right shoulder pain with limited mobility. Remote history of shoulder surgery. EXAM:  RIGHT SHOULDER - 2+ VIEW COMPARISON:  None Available. FINDINGS: There is no evidence of acute fracture or dislocation. There has been surgical resection of the distal clavicle. Degenerative changes are present at the glenohumeral joint. Soft tissues are unremarkable. IMPRESSION: 1. No acute fracture or dislocation. 2. Degenerative changes at the glenohumeral joint. 3. Surgical resection of the distal clavicle. Electronically Signed   By: Leita Birmingham M.D.   On: 04/20/2024 14:39     Procedures   Medications Ordered in the ED  oxyCODONE -acetaminophen  (PERCOCET/ROXICET) 5-325 MG per tablet 1 tablet (1 tablet Oral Given 04/20/24 1358)  Medical Decision Making Pt with remote shoulder surgery here with pain to the shoulder after rolling over in bed and felt a sharp pain.  No headache, visual changes, numbness or weakness of the extremities or neck pain.  Pain reproduced with movement, pain resolves when arm flexed at 90 degrees and held to her chest.   Suspect musculoskeletal, fracture or dislocation also considered but given absence of trauma doubtful of acute bony injury.   No focal neurodeficits no neck pain on exam  Amount and/or Complexity of Data Reviewed Radiology: ordered.    Details: X-ray of the right shoulder without evidence of fracture or dislocation there is degenerative changes at the glenohumeral joint and surgical resection of the clavicle Discussion of management or test interpretation with external provider(s):  Suspect musculoskeletal pain of the shoulder.  No concerning symptoms for septic joint.  Neurovascularly intact.  Patient requesting sling, given strict use instructions and patient verbalized understanding will follow-up closely outpatient with orthopedics, states she has orthopedic provider in [facility] to follow-up with.  Risk Prescription drug management.        Final diagnoses:  Acute pain of right shoulder    ED  Discharge Orders     None          Herlinda Milling, PA-C 04/20/24 1533    Francesca Elsie CROME, MD 04/23/24 1059

## 2024-04-27 DIAGNOSIS — N182 Chronic kidney disease, stage 2 (mild): Secondary | ICD-10-CM | POA: Diagnosis not present

## 2024-04-27 DIAGNOSIS — F411 Generalized anxiety disorder: Secondary | ICD-10-CM | POA: Diagnosis not present

## 2024-04-27 DIAGNOSIS — Z6825 Body mass index (BMI) 25.0-25.9, adult: Secondary | ICD-10-CM | POA: Diagnosis not present

## 2024-04-27 DIAGNOSIS — E1122 Type 2 diabetes mellitus with diabetic chronic kidney disease: Secondary | ICD-10-CM | POA: Diagnosis not present

## 2024-04-27 DIAGNOSIS — I10 Essential (primary) hypertension: Secondary | ICD-10-CM | POA: Diagnosis not present

## 2024-04-27 DIAGNOSIS — J449 Chronic obstructive pulmonary disease, unspecified: Secondary | ICD-10-CM | POA: Diagnosis not present

## 2024-04-27 DIAGNOSIS — K219 Gastro-esophageal reflux disease without esophagitis: Secondary | ICD-10-CM | POA: Diagnosis not present

## 2024-04-27 DIAGNOSIS — G4739 Other sleep apnea: Secondary | ICD-10-CM | POA: Diagnosis not present

## 2024-04-27 DIAGNOSIS — E7849 Other hyperlipidemia: Secondary | ICD-10-CM | POA: Diagnosis not present

## 2024-04-29 DIAGNOSIS — N182 Chronic kidney disease, stage 2 (mild): Secondary | ICD-10-CM | POA: Diagnosis not present

## 2024-04-30 ENCOUNTER — Ambulatory Visit (INDEPENDENT_AMBULATORY_CARE_PROVIDER_SITE_OTHER): Admitting: Otolaryngology

## 2024-04-30 VITALS — BP 127/69 | HR 91

## 2024-04-30 DIAGNOSIS — K219 Gastro-esophageal reflux disease without esophagitis: Secondary | ICD-10-CM | POA: Diagnosis not present

## 2024-04-30 DIAGNOSIS — R1310 Dysphagia, unspecified: Secondary | ICD-10-CM

## 2024-04-30 NOTE — Progress Notes (Signed)
 ENT Progress Note:   Update 04/30/2024  Discussed the use of AI scribe software for clinical note transcription with the patient, who gave verbal consent to proceed.  History of Present Illness Amy Cobb is a 63 year old female who presents with difficulty swallowing pills and certain foods.  She has been experiencing progressive difficulty swallowing pills and certain foods. Even her regular everyday pills are getting stuck, requiring her to split them. She has also started to experience similar issues with certain foods, despite chewing them thoroughly.  A recent swallow study was conducted, which was mostly normal except for a barium tablet that got lodged.  She has been managing her symptoms by crushing her pills and sometimes adding yogurt to aid swallowing. She also mentions ongoing reflux control as part of her management strategy.  Records Reviewed:  Initial Evaluation  Reason for Consult: dysphagia    History of Present Illness   History of Present Illness   Amy Cobb is a 63 year old female who presents with difficulty swallowing.  She has been experiencing difficulty swallowing since January, following an episode of pneumonia, treated outpatient. The sensation is described as if something is stuck when swallowing pills, causing significant discomfort. She has resorted to cutting her pills in half due to this issue. No coughing or choking on solid foods and liquids is reported.  In January, she was diagnosed with pneumonia in her left lung, treated with antibiotics and steroids, without hospitalization. Since then, she has had persistent respiratory issues. She has a history of chronic bronchitis and recently completed a course of steroids but still feels tightness in her chest. She uses a nebulizer and an inhaler for her respiratory condition.  She occasionally experiences heartburn, though it is not frequent. She was previously prescribed medication for  heartburn several years ago, which resolved her symptoms. She is not currently taking Pepcid . Recently, she has noticed an increase in saliva production. No history of strokes is reported.      Records Reviewed:  ED visit for dyspnea 01/17/24  Patient presents with shortness of breath. Patient has pulmonary disease, chronic bronchitis versus mild COPD. She uses albuterol , nebulizer, inhaler regularly. Over the past 2 days, weeks she has had increasing dyspnea. No fever, no chest pain, no abdominal pain. No relief with medications. She is here with her mother who assists with the history.   Sent home on prednisone     Past Medical History:  Diagnosis Date   Acid reflux    Anxiety    Asthma    COPD (chronic obstructive pulmonary disease) (HCC)    Depression    Essential hypertension    OSA on CPAP    Type 2 diabetes mellitus (HCC)    Vulvar cancer (HCC) 2003    Past Surgical History:  Procedure Laterality Date   ABDOMINAL HYSTERECTOMY     BREAST CYST EXCISION Right    CERVICAL FUSION     CO2 LASER APPLICATION  2003   CO2 LASER VAPORIZATION OF THE VULVAR LESION   NASAL SEPTOPLASTY W/ TURBINOPLASTY N/A 02/17/2020   Procedure: BILATERAL NASAL SEPTOPLASTY;  Surgeon: Karis Clunes, MD;  Location: [facility] OR;  Service: ENT;  Laterality: N/A;   SHOULDER SURGERY     TURBINATE REDUCTION Bilateral 02/17/2020   Procedure: BILATERAL TURBINATE REDUCTION;  Surgeon: Karis Clunes, MD;  Location: [facility] OR;  Service: ENT;  Laterality: Bilateral;    Family History  Problem Relation Age of Onset   Hypertension Mother  Hypertension Sister    Breast cancer Sister 34   Diabetes Sister    Breast cancer Cousin        several cousins   Diabetes Cousin     Social History:  reports that she quit smoking about 15 years ago. Her smoking use included cigarettes. She has never used smokeless tobacco. She reports current alcohol use. She reports that she does not use drugs.  Allergies:  Allergies  Allergen Reactions    Augmentin [Amoxicillin-Pot Clavulanate] Anaphylaxis and Other (See Comments)    Hallucinations.   Penicillins Anaphylaxis and Nausea And Vomiting    Hallucinations.   Clavulanic Acid    Gabapentin     Hallucinate    Moxifloxacin Hives   Novolog  [Insulin  Aspart (Human Analog) (Yeast)] Rash    Medications: I have reviewed the patient's current medications.  The PMH, PSH, Medications, Allergies, and SH were reviewed and updated.  ROS: Constitutional: Negative for fever, weight loss and weight gain. Cardiovascular: Negative for chest pain and dyspnea on exertion. Respiratory: Is not experiencing shortness of breath at rest. Gastrointestinal: Negative for nausea and vomiting. Neurological: Negative for headaches. Psychiatric: The patient is not nervous/anxious  Blood pressure 127/69, pulse 91, SpO2 96%. There is no height or weight on file to calculate BMI.  PHYSICAL EXAM:  Exam: General: Well-developed, well-nourished Communication and Voice: Clear pitch and clarity Respiratory Respiratory effort: Equal inspiration and expiration without stridor Cardiovascular Peripheral Vascular: Warm extremities with equal color/perfusion Eyes: No nystagmus with equal extraocular motion bilaterally Neuro/Psych/Balance: Patient oriented to person, place, and time; Appropriate mood and affect; Gait is intact with no imbalance; Cranial nerves I-XII are intact Head and Face Inspection: Normocephalic and atraumatic without mass or lesion Palpation: Facial skeleton intact without bony stepoffs Salivary Glands: No mass or tenderness Facial Strength: Facial motility symmetric and full bilaterally ENT Pinna: External ear intact and fully developed External canal: Canal is patent with intact skin Tympanic Membrane: Clear and mobile External Nose: No scar or anatomic deformity Internal Nose: Septum is relatively straight. No polyp, or purulence. Mucosal edema and erythema present.  Bilateral  inferior turbinate hypertrophy.  Lips, Teeth, and gums: Mucosa and teeth intact and viable TMJ: No pain to palpation with full mobility Oral cavity/oropharynx: No erythema or exudate, no lesions present Neck Neck and Trachea: Midline trachea without mass or lesion Thyroid : No mass or nodularity Lymphatics: No lymphadenopathy  Studies Reviewed: CXR 01/17/24 FINDINGS: No focal airspace consolidation, pleural effusion, or pneumothorax. No cardiomegaly. No acute fracture or destructive lesion. Multilevel thoracic osteophytosis.  MBS 02/07/24  Pt demonstrates normal swallow function except that the barium tablet, taken with water, lodged at C5/6. At this spot there is the appearance of bony protrusion, possible osteophyte. It is also possible that this is a stricture. When pill lodged, liquid passed. SLP offered several trials of puree which slowly carried pill through the esophagus. Pt was in distress when pill stopped, but felt relief as it passed. Video biofeedback provided to show pt clearance. Offered several compensations. Pt unfortunately did not complete esophagram after MBS due to technical issues with Esophagram equipment. Pt may choose to return to complete exam. Would recommend f/u with GI to rule out stricture    IMPRESSION: No acute cardiopulmonary abnormality.  Assessment/Plan: Encounter Diagnoses  Name Primary?   Dysphagia, unspecified type Yes   Chronic GERD      Assessment and Plan Assessment & Plan Dysphagia due to cervical spine osteophyte, otherwise intact OP swallow. Did not have esophagram done  due to equipment issues. Dysphagia limited to pills only - Refer to speech therapist for swallow therapy. - Advise to crush pills or take with yogurt. - will consider GI consult if sx persist but her sx do not localize to esophagus.   Gastroesophageal reflux disease Reflux control may aid dysphagia management. - Continue reflux control measures.     Elena Larry,  MD Otolaryngology [facility] Phone: [phone] Fax: [phone]    04/30/2024, 11:20 AM Fax: [phone]    04/30/2024, 11:20 AM

## 2024-04-30 NOTE — Progress Notes (Signed)
 Patient is having BP managed.

## 2024-05-08 DIAGNOSIS — M25511 Pain in right shoulder: Secondary | ICD-10-CM | POA: Diagnosis not present

## 2024-05-18 ENCOUNTER — Telehealth (INDEPENDENT_AMBULATORY_CARE_PROVIDER_SITE_OTHER): Payer: Self-pay

## 2024-05-18 NOTE — Telephone Encounter (Signed)
 Pt went in to get swallow study but was unable to her medicare insurance requires two diagnostics they stated they just need one more so they can do the study.  Call back number is [phone]

## 2024-05-19 ENCOUNTER — Other Ambulatory Visit (HOSPITAL_COMMUNITY): Payer: Self-pay | Admitting: Occupational Therapy

## 2024-05-19 DIAGNOSIS — K219 Gastro-esophageal reflux disease without esophagitis: Secondary | ICD-10-CM

## 2024-05-19 DIAGNOSIS — R1312 Dysphagia, oropharyngeal phase: Secondary | ICD-10-CM

## 2024-05-29 ENCOUNTER — Ambulatory Visit (HOSPITAL_COMMUNITY): Attending: Otolaryngology | Admitting: Speech Pathology

## 2024-05-29 ENCOUNTER — Ambulatory Visit (HOSPITAL_COMMUNITY)
Admission: RE | Admit: 2024-05-29 | Discharge: 2024-05-29 | Disposition: A | Discharge status: Home / Self Care | Source: Ambulatory Visit | Attending: Otolaryngology | Admitting: Otolaryngology

## 2024-05-29 DIAGNOSIS — K219 Gastro-esophageal reflux disease without esophagitis: Secondary | ICD-10-CM | POA: Insufficient documentation

## 2024-05-29 DIAGNOSIS — R1310 Dysphagia, unspecified: Secondary | ICD-10-CM | POA: Insufficient documentation

## 2024-05-29 DIAGNOSIS — R1312 Dysphagia, oropharyngeal phase: Secondary | ICD-10-CM | POA: Insufficient documentation

## 2024-05-29 DIAGNOSIS — K449 Diaphragmatic hernia without obstruction or gangrene: Secondary | ICD-10-CM | POA: Diagnosis not present

## 2024-05-29 NOTE — Therapy (Signed)
 SLP Cancellation Note  Patient Details Name: Amy Cobb MRN: [id number] DOB: May 19, 1961   Cancelled treatment:       Reason Eval/Treat Not Completed: SLP screened, no needs identified, will sign off;Other (comment);Discussed prior MBS with pt and results being normal for swallowing function with exception of pill consumption which eventually cleared with liquid wash.  Pt noted compensatory strategies she has been using and effective at home to A with this issue. Gave esophageal precaution hand-out to pt in radiology suite.  Pt declined MBS d/t recent completion 6/25.  ST will s/o at this time.   Pat Adrain Nesbit,M.S.,CCC-SLP 05/29/2024, 11:20 AM

## 2024-06-03 DIAGNOSIS — Z794 Long term (current) use of insulin: Secondary | ICD-10-CM | POA: Diagnosis not present

## 2024-06-03 DIAGNOSIS — E113313 Type 2 diabetes mellitus with moderate nonproliferative diabetic retinopathy with macular edema, bilateral: Secondary | ICD-10-CM | POA: Diagnosis not present

## 2024-06-04 DIAGNOSIS — G4739 Other sleep apnea: Secondary | ICD-10-CM | POA: Diagnosis not present

## 2024-06-04 DIAGNOSIS — F411 Generalized anxiety disorder: Secondary | ICD-10-CM | POA: Diagnosis not present

## 2024-06-04 DIAGNOSIS — J441 Chronic obstructive pulmonary disease with (acute) exacerbation: Secondary | ICD-10-CM | POA: Diagnosis not present

## 2024-06-04 DIAGNOSIS — N182 Chronic kidney disease, stage 2 (mild): Secondary | ICD-10-CM | POA: Diagnosis not present

## 2024-06-04 DIAGNOSIS — I10 Essential (primary) hypertension: Secondary | ICD-10-CM | POA: Diagnosis not present

## 2024-06-04 DIAGNOSIS — E1122 Type 2 diabetes mellitus with diabetic chronic kidney disease: Secondary | ICD-10-CM | POA: Diagnosis not present

## 2024-06-04 DIAGNOSIS — E7849 Other hyperlipidemia: Secondary | ICD-10-CM | POA: Diagnosis not present

## 2024-06-04 DIAGNOSIS — J449 Chronic obstructive pulmonary disease, unspecified: Secondary | ICD-10-CM | POA: Diagnosis not present

## 2024-06-04 DIAGNOSIS — Z6824 Body mass index (BMI) 24.0-24.9, adult: Secondary | ICD-10-CM | POA: Diagnosis not present

## 2024-06-04 DIAGNOSIS — K219 Gastro-esophageal reflux disease without esophagitis: Secondary | ICD-10-CM | POA: Diagnosis not present

## 2024-06-05 ENCOUNTER — Other Ambulatory Visit: Payer: Self-pay | Admitting: Cardiology

## 2024-06-08 ENCOUNTER — Other Ambulatory Visit (HOSPITAL_COMMUNITY): Payer: Self-pay | Admitting: Internal Medicine

## 2024-06-08 DIAGNOSIS — R0602 Shortness of breath: Secondary | ICD-10-CM

## 2024-06-10 ENCOUNTER — Ambulatory Visit (HOSPITAL_COMMUNITY)
Admission: RE | Admit: 2024-06-10 | Discharge: 2024-06-10 | Disposition: A | Discharge status: Home / Self Care | Source: Ambulatory Visit | Attending: Internal Medicine | Admitting: Internal Medicine

## 2024-06-10 DIAGNOSIS — R0602 Shortness of breath: Secondary | ICD-10-CM | POA: Insufficient documentation

## 2024-06-10 DIAGNOSIS — K449 Diaphragmatic hernia without obstruction or gangrene: Secondary | ICD-10-CM | POA: Diagnosis not present

## 2024-06-10 DIAGNOSIS — I3139 Other pericardial effusion (noninflammatory): Secondary | ICD-10-CM | POA: Diagnosis not present

## 2024-06-10 DIAGNOSIS — I517 Cardiomegaly: Secondary | ICD-10-CM | POA: Diagnosis not present

## 2024-06-11 ENCOUNTER — Ambulatory Visit (HOSPITAL_BASED_OUTPATIENT_CLINIC_OR_DEPARTMENT_OTHER): Admitting: Pulmonary Disease

## 2024-06-11 ENCOUNTER — Encounter (HOSPITAL_BASED_OUTPATIENT_CLINIC_OR_DEPARTMENT_OTHER): Payer: Self-pay | Admitting: Pulmonary Disease

## 2024-06-11 VITALS — BP 130/66 | HR 88 | Ht 67.0 in | Wt 160.0 lb

## 2024-06-11 DIAGNOSIS — J209 Acute bronchitis, unspecified: Secondary | ICD-10-CM | POA: Diagnosis not present

## 2024-06-11 DIAGNOSIS — J310 Chronic rhinitis: Secondary | ICD-10-CM

## 2024-06-11 DIAGNOSIS — J4541 Moderate persistent asthma with (acute) exacerbation: Secondary | ICD-10-CM | POA: Diagnosis not present

## 2024-06-11 DIAGNOSIS — R1319 Other dysphagia: Secondary | ICD-10-CM | POA: Diagnosis not present

## 2024-06-11 MED ORDER — PREDNISONE 10 MG PO TABS: ORAL_TABLET | ORAL | 0 refills | Status: AC

## 2024-06-11 MED ORDER — CEFDINIR 300 MG PO CAPS: 300.0000 mg | ORAL_CAPSULE | Freq: Two times a day (BID) | ORAL | 0 refills | Status: DC

## 2024-06-11 NOTE — Patient Instructions (Signed)
 X Sputum culture   VISIT SUMMARY: During your visit, we discussed your worsening asthma symptoms, frequent emergency room visits, and other related health issues. We reviewed your current medications and made some adjustments to better manage your symptoms. We also discussed your chronic rhinitis, swallowing difficulties, and the need for follow-up care.  YOUR PLAN: -MODERATE PERSISTENT ASTHMA WITH ACUTE EXACERBATION AND SUPERIMPOSED ACUTE BRONCHITIS: Your asthma has worsened recently, likely due to a cold, leading to frequent emergency room visits. We will increase your Symbicort  to two puffs twice a day and stop the budesonide  nebulizer. You will also start taking Omnicef (cefdinir) and a course of prednisone . We will perform a sputum culture to check for antibiotic resistance. Continue using your albuterol  inhaler as needed.  -FREQUENT EXACERBATIONS AND EMERGENCY VISITS: You have been experiencing frequent asthma exacerbations and emergency room visits. We will schedule a follow-up appointment sooner than six months to closely monitor your condition.  -CHRONIC RHINITIS WITH POSTNASAL DRAINAGE: You have significant postnasal drainage contributing to your symptoms. Continue using Flonase  daily to manage this.  -DYSPHAGIA DUE TO ESOPHAGEAL STRUCTURAL ABNORMALITY: You have difficulty swallowing due to a structural abnormality in your esophagus. You have been referred for swallowing therapy, which you should start soon. Surgical intervention is considered risky.                        Contains text generated by [employer].                                 Contains text generated by [employer].

## 2024-06-11 NOTE — Progress Notes (Signed)
 Subjective:    Patient ID: Amy Cobb, female    DOB: 11-May-1961, 63 y.o.   MRN: [id number]   63  yo ex-smoker  For FU of asthma and OSA -unable to tolerate CPAP She smoked about a pack per week starting at age 38 until age 74, less than 10 pack years.  She reports that her breathing problem started when she moved from [place]  to [place]  in 2005.  She reports prednisone  tapers 5-6 times in the year and hospitalized at least once or twice every year.  OV 02/2022 >>continues to have flares of her asthma. -step up therapy from Symbicort  to Trelegy   PMH - hospitalized with Covid pneumonia 03/2020, discharged on 4 L oxygen .    She has a delayed sleep phase syndrome with bedtime as late as 2 AM  -insulin  requiring diabetes, hypertension requiring 6 medications    ED visit 09/2021 for chest pain, CT angiogram was negative for pulmonary embolism but showed small pericardial effusion. She was treated with NSAIDs for presumed pericarditis.    Discussed the use of AI scribe software for clinical note transcription with the patient, who gave verbal consent to proceed.  History of Present Illness Amy Cobb is a 63 year old female with moderate persistent asthma who presents for a six-month follow-up.  She has experienced worsening asthma symptoms for the past month and a half following a cold, leading to approximately thirteen emergency room visits for steroid treatments. Her primary care physician also administered additional steroids. She currently uses Symbicort  two puffs once a day, albuterol  as a rescue inhaler, and nebulizer treatments with budesonide  every twelve hours.  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 She produces thick, clear sputum and experiences choking when trying to expel it. An esophagus test revealed a protrusion causing swallowing difficulties, requiring her to split pills in half. She has been referred for therapy to  assist with swallowing but has not yet started.  She has undergone multiple CT scans, the most recent being a low-dose CT scan for lung cancer screening. She does not qualify for a CT scan with contrast due to her smoking history. She has not been given antibiotics recently and is allergic to penicillin, with a reaction occurring over ten years ago.  She experiences significant sinus drainage and uses Flonase  daily. No recent anaphylactic reactions to penicillin. No green sputum. She experiences choking when trying to expel sputum but does not get choked on food or water. She reports a sensation of something being stuck in her throat but denies any issues with swallowing food or water.   CT chest 10/22 reviewed  - await report - changes of bronchitis, RLL atx vs infx    Significant tests/ events reviewed 01/2024 swallow eval & esophagram 05/2024 shows C5/T1 impingement on esophagus     04/2021 lexiscan  myoview  >> low risk study   PFTs 10/2020 no airway obstruction, nml FEV1, DLCO 74%   09/2020 ONO/RA shows minimal desaturation for 7 minutes less than 88%.>> dc O2   10/2022 HST  very mild OSA with AHI 6/ hr   04/2018 NPSG -mild OSA, AHI 9/hour, RDI 30/hour 05/2018 CPAP titration >> 10 cm , medium mask, wt 174 lbs   Review of Systems  neg for any significant sore throat, dysphagia, itching, sneezing, nasal congestion or excess/ purulent secretions, fever, chills, sweats, unintended wt loss, pleuritic or exertional cp, hempoptysis, orthopnea pnd or change in chronic leg  swelling. Also denies presyncope, palpitations, heartburn, abdominal pain, nausea, vomiting, diarrhea or change in bowel or urinary habits, dysuria,hematuria, rash, arthralgias, visual complaints, headache, numbness weakness or ataxia.      Objective:   Physical Exam  Gen. Pleasant, well-nourished, in no distress ENT - no thrush, no pallor/icterus,no post nasal drip Neck: No JVD, no thyromegaly, no carotid bruits Lungs: no  use of accessory muscles, no dullness to percussion, clear without rales or rhonchi , cough + Cardiovascular: Rhythm regular, heart sounds  normal, no murmurs or gallops, no peripheral edema Musculoskeletal: No deformities, no cyanosis or clubbing        Assessment & Plan:   Assessment and Plan Assessment & Plan Moderate persistent asthma with acute exacerbation and superimposed acute bronchitis Experiencing exacerbation for the past month and a half, likely triggered by a recent cold. Required multiple emergency room visits and steroid treatments. Reports thick, clear sputum production and frequent coughing. CT scan shows no significant pneumonia, but subtle changes at the right lung base, likely due to positioning. Symptoms consistent with acute bronchitis superimposed on asthma exacerbation. - Increase Symbicort  to two puffs twice a day. - Discontinue budesonide  nebulizer if using Symbicort  twice a day. - Prescribe Omnicef (cefdinir) due to penicillin allergy , noting the allergy  was more than ten years ago. - Prescribe a course of prednisone . - Perform sputum culture to check for antibiotic resistance. - Continue using albuterol  as a rescue inhaler as needed.  Frequent exacerbations and emergency visits Experiencing frequent exacerbations and emergency visits. - Schedule follow-up appointment sooner   Chronic rhinitis with postnasal drainage Reports significant postnasal drainage contributing to symptoms. Continues to use Flonase  daily to manage these symptoms. - Continue Flonase  daily.  Dysphagia due to eC5 impingement Dysphagia due to an esophageal structural abnormality, possibly a bony protrusion from the spine impinging on the esophagus. Difficulty swallowing pills, requiring her to be split. Referred for swallowing therapy but not yet started. Surgical intervention considered risky due to proximity to the spine. - Await initiation of swallowing therapy as previously  referred.

## 2024-06-15 ENCOUNTER — Telehealth (INDEPENDENT_AMBULATORY_CARE_PROVIDER_SITE_OTHER): Payer: Self-pay

## 2024-06-15 NOTE — Telephone Encounter (Signed)
 Pt called asking for results of her swallow study and esophagus

## 2024-06-16 ENCOUNTER — Telehealth (INDEPENDENT_AMBULATORY_CARE_PROVIDER_SITE_OTHER): Payer: Self-pay | Admitting: Physician Assistant

## 2024-06-16 DIAGNOSIS — R1310 Dysphagia, unspecified: Secondary | ICD-10-CM

## 2024-06-16 NOTE — Telephone Encounter (Signed)
 done

## 2024-06-16 NOTE — Telephone Encounter (Signed)
 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  I reviewed Dr. Vallery previous notes as well.  Given the findings I do feel she would benefit most from gastroenterology referral.  She also has referral for swallow therapy.  She we will follow-up with them as indicated, she will reach out to our office if she has any concerns.

## 2024-06-19 DIAGNOSIS — E119 Type 2 diabetes mellitus without complications: Secondary | ICD-10-CM | POA: Diagnosis not present

## 2024-06-19 DIAGNOSIS — M2042 Other hammer toe(s) (acquired), left foot: Secondary | ICD-10-CM | POA: Diagnosis not present

## 2024-06-19 DIAGNOSIS — M2041 Other hammer toe(s) (acquired), right foot: Secondary | ICD-10-CM | POA: Diagnosis not present

## 2024-06-24 DIAGNOSIS — E113313 Type 2 diabetes mellitus with moderate nonproliferative diabetic retinopathy with macular edema, bilateral: Secondary | ICD-10-CM | POA: Diagnosis not present

## 2024-06-25 ENCOUNTER — Encounter: Payer: Self-pay | Admitting: Gastroenterology

## 2024-08-11 ENCOUNTER — Encounter: Payer: Self-pay | Admitting: Gastroenterology

## 2024-08-11 ENCOUNTER — Ambulatory Visit: Admitting: Gastroenterology

## 2024-08-11 VITALS — BP 130/70 | HR 89 | Ht 67.0 in | Wt 157.0 lb

## 2024-08-11 DIAGNOSIS — R634 Abnormal weight loss: Secondary | ICD-10-CM | POA: Diagnosis not present

## 2024-08-11 DIAGNOSIS — R933 Abnormal findings on diagnostic imaging of other parts of digestive tract: Secondary | ICD-10-CM

## 2024-08-11 DIAGNOSIS — R1310 Dysphagia, unspecified: Secondary | ICD-10-CM

## 2024-08-11 DIAGNOSIS — Z1211 Encounter for screening for malignant neoplasm of colon: Secondary | ICD-10-CM

## 2024-08-11 MED ORDER — PANTOPRAZOLE SODIUM 40 MG PO TBEC: 40.0000 mg | DELAYED_RELEASE_TABLET | Freq: Every day | ORAL | 3 refills | Status: AC

## 2024-08-11 NOTE — Patient Instructions (Signed)
 We have sent the following medications to your pharmacy for you to pick up at your convenience: Pantoprazole  40 mg once daily.  It has been recommended to you by your physician that you have a(n) Endoscopy  completed. Per your request, we did not schedule the procedure(s) today. Please contact our office at [phone] should you decide to have the procedure completed. You will be scheduled for a pre-visit and procedure at that time.  Thank you for trusting me with your gastrointestinal care!   Nestor Blower, PA  _______________________________________________________  If your blood pressure at your visit was 140/90 or greater, please contact your primary care physician to follow up on this.  _______________________________________________________  If you are age 61 or older, your body mass index should be between 23-30. Your Body mass index is 24.59 kg/m. If this is out of the aforementioned range listed, please consider follow up with your Primary Care Provider.  If you are age 61 or younger, your body mass index should be between 19-25. Your Body mass index is 24.59 kg/m. If this is out of the aformentioned range listed, please consider follow up with your Primary Care Provider.   ________________________________________________________  The [facility] providers would like to encourage you to use MYCHART to communicate with providers for non-urgent requests or questions.  Due to long hold times on the telephone, sending your provider a message by [facility] may be a faster and more efficient way to get a response.  Please allow 48 business hours for a response.  Please remember that this is for non-urgent requests.  _______________________________________________________  [facility] is using a team-based approach to care.  Your team is made up of your doctor and two to three APPS. Our APPS (Nurse Practitioners and Physician Assistants) work with your physician to ensure  care continuity for you. They are fully qualified to address your health concerns and develop a treatment plan. They communicate directly with your gastroenterologist to care for you. Seeing the Advanced Practice Practitioners on your physician's team can help you by facilitating care more promptly, often allowing for earlier appointments, access to diagnostic testing, procedures, and other specialty referrals.

## 2024-08-11 NOTE — Progress Notes (Signed)
 "  Chief Complaint: Dysphagia Primary GI MD: Unassigned  HPI: 63 y.o. female with past medical history of HTN, hypertensive heart disease, Type II DM, asthma and OSA (intolerant to CPAP), presents for evaluation of  Evaluated by ENT for dysphagia.  Was told dysphagia was due to cervical spine osteophyte, otherwise intact OP swallow.  dysphagia is only limited pills so patient was referred to speech therapist for swallow therapy and advised to crush pills with yogurt.  ENT felt dysphagia did not localize to esophagus  Ultimately got barium swallow tablet 05/29/2024 which showed small hiatal hernia and barium tablet became stuck at level of T1 for 3 minutes before passing in the stomach with no appreciable narrowing, stricture, or esophageal dysmotility seen  Discussed the use of AI scribe software for clinical note transcription with the patient, who gave verbal consent to proceed.  History of Present Illness  Amy Cobb is a 63 year old female with remote tobacco use who presents with chronic dysphagia and persistent globus sensation.  For over a year, she has experienced persistent difficulty swallowing pills and a constant sensation of something stuck in her throat, localized to the throat without radiation. The sensation is present at all times and is described as very bothersome but stable over time.  She splits pills to facilitate swallowing, but they continue to feel stuck and often uses yogurt to help them pass. Chews food thoroughly and generally tolerates solids, except for occasional difficulty with leafy foods such as lettuce. Sometimes has difficulty with liquids as well.  Previously trialed famotidine  without improvement and discontinued due to lack of heartburn. Not currently taking proton pump inhibitors or antacids. Denies heartburn, nausea, vomiting, blood in stool, choking, or aspiration. No prior endoscopy.  A prior barium swallow study showed tablet retention for  three minutes. Colonoscopy within the past ten years was unremarkable. No family history of colon cancer.  Reports weight loss attributed to decreased appetite and eating less due to grief, anxiety, and depression following the recent loss of three siblings. Denies correlation between swallowing symptoms and this period of grief.  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 Uses Symbicort  inhaler and rinses mouth to avoid oral thrush.  Quit smoking in 2010 after 16 years of light tobacco use. 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 No history of heart attack or stroke.  She has a spot on the roof of her mouth that has been there since birth and has never caused issue before.    PREVIOUS GI WORKUP  MBS 02/07/24  Pt demonstrates normal swallow function except that the barium tablet, taken with water, lodged at C5/6. At this spot there is the appearance of bony protrusion, possible osteophyte. It is also possible that this is a stricture. When pill lodged, liquid passed. SLP offered several trials of puree which slowly carried pill through the esophagus. Pt was in distress when pill stopped, but felt relief as it passed. Video biofeedback provided to show pt clearance. Offered several compensations. Pt unfortunately did not complete esophagram after MBS due to technical issues with Esophagram equipment. Pt may choose to return to complete exam. Would recommend f/u with GI to rule out stricture   Past Medical History:  Diagnosis Date   Acid reflux    Anxiety    Asthma    COPD (chronic obstructive pulmonary disease) (HCC)    Depression  Essential hypertension    OSA on CPAP    Type 2 diabetes mellitus (HCC)    Vulvar cancer (HCC) 2003    Past Surgical History:  Procedure Laterality Date   ABDOMINAL HYSTERECTOMY     BREAST CYST EXCISION Right    CERVICAL FUSION     CO2 LASER  APPLICATION  2003   CO2 LASER VAPORIZATION OF THE VULVAR LESION   NASAL SEPTOPLASTY W/ TURBINOPLASTY N/A 02/17/2020   Procedure: BILATERAL NASAL SEPTOPLASTY;  Surgeon: Karis Clunes, MD;  Location: [facility] OR;  Service: ENT;  Laterality: N/A;   SHOULDER SURGERY     TURBINATE REDUCTION Bilateral 02/17/2020   Procedure: BILATERAL TURBINATE REDUCTION;  Surgeon: Karis Clunes, MD;  Location: [facility] OR;  Service: ENT;  Laterality: Bilateral;    Current Outpatient Medications  Medication Sig Dispense Refill   Accu-Chek FastClix Lancets MISC Apply topically.     Aflibercept  (EYLEA ) 2 MG/0.05ML SOLN 1 each by Intravitreal route See admin instructions. Every 5 weeks     albuterol  (PROVENTIL ) (2.5 MG/3ML) 0.083% nebulizer solution Take 2.5 mg by nebulization every 8 (eight) hours as needed for wheezing or shortness of breath.      albuterol  (VENTOLIN  HFA) 108 (90 Base) MCG/ACT inhaler Inhale 2 puffs into the lungs every 4 (four) hours as needed for wheezing or shortness of breath. 8 g 2   Alcohol Swabs (B-D SINGLE USE SWABS REGULAR) PADS      benzonatate  (TESSALON ) 100 MG capsule Take 1 capsule (100 mg total) by mouth 3 (three) times daily as needed for cough. 21 capsule 0   Blood Glucose Monitoring Suppl (ACCU-CHEK GUIDE ME) w/Device KIT      chlorthalidone  (HYGROTON ) 25 MG tablet Take 25 mg by mouth daily.     cloNIDine  (CATAPRES ) 0.2 MG tablet Take 1 tablet (0.2 mg total) by mouth 3 (three) times daily.     cyanocobalamin (,VITAMIN B-12,) 1000 MCG/ML injection Inject 1,000 mcg as directed every 30 (thirty) days.     DROPLET PEN NEEDLES 32G X 4 MM MISC      famotidine  (PEPCID ) 20 MG tablet Take 20 mg by mouth at bedtime.     fexofenadine (ALLEGRA) 180 MG tablet as needed.     fluticasone  (FLONASE ) 50 MCG/ACT nasal spray Place 1 spray into both nostrils daily.     fluticasone  (FLONASE ) 50 MCG/ACT nasal spray Place 1 spray into both nostrils daily. 11.1 mL 2   furosemide (LASIX) 20 MG tablet Take 20 mg by mouth 3 (three)  times daily. prn (Patient taking differently: Take 20 mg by mouth 3 (three) times daily. Pt take as needed)     glimepiride (AMARYL) 4 MG tablet      hydrALAZINE  (APRESOLINE ) 50 MG tablet TAKE 1 TABLET THREE TIMES DAILY 270 tablet 3   insulin  glargine (LANTUS ) 100 UNIT/ML injection Inject 50 Units into the skin 2 (two) times daily.     insulin  lispro (HUMALOG ) 100 UNIT/ML KwikPen Inject 25 Units into the skin 3 (three) times daily.     labetalol  (NORMODYNE ) 200 MG tablet TAKE 2 TABLETS TWICE DAILY 360 tablet 3   levocetirizine (XYZAL) 5 MG tablet Take 5 mg by mouth every evening.     losartan  (COZAAR ) 100 MG tablet TAKE 1 TABLET EVERY DAY (NEED MD APPOINTMENT) 30 tablet 11   minoxidil  (LONITEN ) 2.5 MG tablet Take 2.5 mg by mouth daily.     pantoprazole  (PROTONIX ) 40 MG tablet Take 1 tablet (40 mg total) by mouth daily. 30 tablet 3  potassium chloride  SA (KLOR-CON  M) 20 MEQ tablet Take 1 tablet (20 mEq total) by mouth daily. 30 tablet 0   predniSONE  (DELTASONE ) 10 MG tablet Take 4 tabs  daily with food x 4 days, then 3 tabs daily x 4 days, then 2 tabs daily x 4 days, then 1 tab daily x4 days then stop. #40 40 tablet 0   promethazine -dextromethorphan (PROMETHAZINE -DM) 6.25-15 MG/5ML syrup Take 5 mLs by mouth every 6 (six) hours as needed for cough.     rosuvastatin  (CRESTOR ) 5 MG tablet Take 1 tablet (5 mg total) by mouth once a week. 15 tablet 3   cefdinir  (OMNICEF ) 300 MG capsule Take 1 capsule (300 mg total) by mouth 2 (two) times daily. (Patient not taking: Reported on 08/11/2024) 14 capsule 0   famotidine  (PEPCID ) 20 MG tablet Take 1 tablet (20 mg total) by mouth 2 (two) times daily. (Patient not taking: Reported on 08/11/2024) 30 tablet 3   No current facility-administered medications for this visit.    Allergies as of 08/11/2024 - Review Complete 08/11/2024  Allergen Reaction Noted   Augmentin [amoxicillin-pot clavulanate] Anaphylaxis and Other (See Comments) 12/17/2012   Penicillins  Anaphylaxis and Nausea And Vomiting 12/17/2012   Clavulanic acid  03/16/2020   Gabapentin  07/23/2017   Moxifloxacin Hives 07/23/2017   Novolog  [insulin  aspart (human analog) (yeast)] Rash 01/01/2018    Family History  Problem Relation Age of Onset   Hypertension Mother    Hypertension Sister    Breast cancer Sister 63   Diabetes Sister    Breast cancer Cousin        several cousins   Diabetes Cousin     Social History   Socioeconomic History   Marital status: Media Planner    Spouse name: Not on file   Number of children: Not on file   Years of education: Not on file   Highest education level: Not on file  Occupational History   Occupation: disable  Tobacco Use   Smoking status: Former    Current packs/day: 0.00    Average packs/day: 0.3 packs/day    Types: Cigarettes    Quit date: 12/25/2008    Years since quitting: 15.6   Smokeless tobacco: Never   Tobacco comments:    Used to smoke about 3 a day  Vaping Use   Vaping status: Never Used  Substance and Sexual Activity   Alcohol use: Yes    Comment: occaisonally   Drug use: No   Sexual activity: Yes    Partners: Male    Comment: 1st intercourse- 9, partners- (6 female) (52 female),  married- 16 yrs   Other Topics Concern   Not on file  Social History Narrative   Not on file   Social Drivers of Health   Tobacco Use: Medium Risk (08/11/2024)   Patient History    Smoking Tobacco Use: Former    Smokeless Tobacco Use: Never    Passive Exposure: Not on Actuary Strain: Not on file  Food Insecurity: Not on file  Transportation Needs: Not on file  Physical Activity: Not on file  Stress: Not on file  Social Connections: Not on file  Intimate Partner Violence: Not At Risk (11/14/2022)   Received from [facility]   Epic    Within the last year, have you been afraid of your partner or ex-partner?: No    Within the last year, have you been humiliated or emotionally abused in other ways by your  partner or ex-partner?: No    Within the last year, have you been kicked, hit, slapped, or otherwise physically hurt by your partner or ex-partner?: No    Within the last year, have you been raped or forced to have any kind of sexual activity by your partner or ex-partner?: No  Depression (PHQ2-9): Not on file  Alcohol Screen: Not on file  Housing: Not on file  Utilities: Not on file  Health Literacy: Low Risk (11/14/2022)   Received from [facility]    How often do you need to have someone help you when you read instructions, pamphlets, or other written material from your doctor or pharmacy?: Never    Review of Systems:    Constitutional: No weight loss, fever, chills, weakness or fatigue HEENT: Eyes: No change in vision               Ears, Nose, Throat:  No change in hearing or congestion Skin: No rash or itching Cardiovascular: No chest pain, chest pressure or palpitations   Respiratory: No SOB or cough Gastrointestinal: See HPI and otherwise negative Genitourinary: No dysuria or change in urinary frequency Neurological: No headache, dizziness or syncope Musculoskeletal: No new muscle or joint pain Hematologic: No bleeding or bruising Psychiatric: No history of depression or anxiety    Physical Exam:  Vital signs: BP 130/70   Pulse 89   Ht 5' 7 (1.702 m)   Wt 157 lb (71.2 kg)   BMI 24.59 kg/m   Constitutional: NAD, alert and cooperative Head:  Normocephalic and atraumatic. Oropharynx without candida/thrush. Eyes:   PEERL, EOMI. No icterus. Conjunctiva pink. Respiratory: Respirations even and unlabored. Lungs clear to auscultation bilaterally.   No wheezes, crackles, or rhonchi.  Cardiovascular:  Regular rate and rhythm. No peripheral edema, cyanosis or pallor.  Gastrointestinal:  Soft, nondistended, nontender. No rebound or guarding. Normal bowel sounds. No appreciable masses or hepatomegaly. Rectal:  Declines Msk:  Symmetrical without gross  deformities. Without edema, no deformity or joint abnormality.  Neurologic:  Alert and  oriented x4;  grossly normal neurologically.  Skin:   Dry and intact without significant lesions or rashes. Psychiatric: Oriented to person, place and time. Demonstrates good judgement and reason without abnormal affect or behaviors.  Physical Exam    RELEVANT LABS AND IMAGING: CBC    Component Value Date/Time   WBC 5.6 01/17/2024 1332   RBC 5.24 (H) 01/17/2024 1332   HGB 12.8 01/17/2024 1332   HCT 40.8 01/17/2024 1332   PLT 299 01/17/2024 1332   MCV 77.9 (L) 01/17/2024 1332   MCH 24.4 (L) 01/17/2024 1332   MCHC 31.4 01/17/2024 1332   RDW 17.7 (H) 01/17/2024 1332   LYMPHSABS 1.8 01/17/2024 1332   MONOABS 0.8 01/17/2024 1332   EOSABS 0.2 01/17/2024 1332   BASOSABS 0.1 01/17/2024 1332    CMP     Component Value Date/Time   NA 139 01/17/2024 1332   K 3.8 01/17/2024 1332   CL 98 01/17/2024 1332   CO2 30 01/17/2024 1332   GLUCOSE 141 (H) 01/17/2024 1332   BUN 14 01/17/2024 1332   CREATININE 0.77 01/17/2024 1332   CALCIUM  9.9 01/17/2024 1332   PROT 7.2 01/17/2024 1332   ALBUMIN 3.6 01/17/2024 1332   AST 20 01/17/2024 1332   ALT 13 01/17/2024 1332   ALKPHOS 74 01/17/2024 1332   BILITOT 0.5 01/17/2024 1332   GFRNONAA >60 01/17/2024 1332   GFRAA >60 02/12/2020 1422  Assessment/Plan:   Dysphagia Pill and occasional food dysphagia ongoing one year. Evaluated by ENT who felt dysphagia was due to cervical spine osteophyte, otherwise intact OP swallow on MBS 01/2024.  barium tablet became stuck at level of T1 for 3 minutes before passing in the stomach with no appreciable narrowing, stricture, or esophageal dysmotility seen and patient felt choking/anxiety at this time.  Possibly functional obstruction at the EGJ versus globus sensation. No candida on PE of oropharynx. No previous EGD. No GERD. Previous smoking history (quit 2010).  -- EGD with possible dilation for evaluation -- trial  of pantoprazole  40mg  once daily for possible globus sensation -- if EGD is negative, consider esophageal manometry -- I thoroughly discussed the procedure with the patient (at bedside) to include nature of the procedure, alternatives, benefits, and risks (including but not limited to bleeding, infection, perforation, anesthesia/cardiac pulmonary complications).  Patient verbalized understanding and gave verbal consent to proceed with procedure.   Colon cancer screening Reports normal colonoscopy at [facility] within the last 10 years, states she is not due yet -- will obtain records.  Weight loss Reports weight loss attributed to decreased appetite and eating less due to grief, anxiety, and depression following the recent loss of three siblings. 164 9/25 and today she is 157 -- continue to monitor -- consider imaging if persistent weight loss  Assigned to Dr. Leigh today (Tuesday)   Sophronia Varney Mollie RIGGERS [facility] 08/11/2024, 2:48 PM  Cc: Orpha Yancey LABOR, MD "

## 2024-08-11 NOTE — Progress Notes (Signed)
 Agree with assessment and plan as outlined.  Bayley I am happy to do the EGD for this patient but my [facility] is full through the end of Jan. My POD A partners have several openings in the [facility] sooner than I have availability if the patient wants to have her EGD done sooner, can also have her establish with one of them.

## 2024-08-25 ENCOUNTER — Telehealth: Payer: Self-pay | Admitting: Gastroenterology

## 2024-08-25 DIAGNOSIS — R933 Abnormal findings on diagnostic imaging of other parts of digestive tract: Secondary | ICD-10-CM

## 2024-08-25 DIAGNOSIS — R1310 Dysphagia, unspecified: Secondary | ICD-10-CM

## 2024-08-25 NOTE — Telephone Encounter (Signed)
 Patient has been scheduled for 1/28 EGD with dr. Lieutenant. Requesting a call to discuss prep instructions. Please advise, thank you

## 2024-08-25 NOTE — Progress Notes (Unsigned)
 "  Cardiology Office Note    Date:  08/26/2024  ID:  Amy CHECKETTS, DOB 02-05-1961, MRN [id number] Cardiologist: Jayson Sierras, MD Cardiology APP:  Hinojosa Laymon CHRISTELLA, PA-C { :  History of Present Illness:    Amy Cobb is a 64 y.o. female with past medical history of HTN, hypertensive heart disease, Type II DM, asthma and OSA (intolerant to CPAP) who presents to the office today for 14-month follow-up.   She was last examined by myself in 02/2024 and reported intermittent dyspnea on exertion in the setting of her asthma. She had noted more frequent palpitations over the past several weeks and did not consume caffeine and reported only rare alcohol intake. A follow-up 1 week Zio patch was recommended to assess for any significant arrhythmias and she was continued on Labetalol  400 mg twice daily. It had previously been recommended for her to be on Crestor  5 mg daily and she did not wish to take a daily medication, therefore was encouraged to try taking this once weekly. If able to tolerate, was recommend to recheck an FLP and LFT's in 3 months.  Her follow-up Zio patch showed predominantly normal sinus rhythm with an average heart rate of 84 bpm.  She did have rare PAC's and PVC's representing less than 1% of total beats and 1 episode of SVT for 6 beats.  In talking with the Cobb today, she reports being under increased stress as her brother recently passed away. Says that her palpitations have improved since her last visit and she credits this to dietary changes and being more active. She does not consume caffeine. Reports her breathing has been stable and denies any dyspnea on exertion, orthopnea, PND or pitting edema. No recent chest pain or persistent palpitations. She has been experiencing dysphagia and is scheduled for an EGD later this month. Says she cuts her current pills in half to help with symptoms. Notices that her blood pressure is elevated around the time she is due for her  next dose.  Studies Reviewed:   EKG: EKG is not ordered today.  Limited Echo: 05/2022 IMPRESSIONS     1. Left ventricular ejection fraction, by estimation, is 65 to 70%. The  left ventricle has normal function. Left ventricular diastolic parameters  are consistent with Grade I diastolic dysfunction (impaired relaxation).   2. Right ventricular systolic function is normal. The right ventricular  size is normal.   3. There is no evidence of cardiac tamponade.Trivial circumferential  pericardial effusion   4. The inferior vena cava is normal in size with greater than 50%  respiratory variability, suggesting right atrial pressure of 3 mmHg.   5. Limited echo to evaluate pericardial effusion   Event Monitor: 02/2024 ZIO monitor reviewed.  7 days, 13 hours analyzed.   Predominant rhythm is sinus with heart rate ranging from 60 bpm up to 120 bpm and average heart rate 84 bpm. There were rare PACs and PVCs representing less than 1% total beats. A single episode of PSVT was noted lasting only 6 beats.  There were no sustained arrhythmias. No pauses or high degree heart block.   Physical Exam:   VS:  BP 130/72 (BP Location: Left Arm, Cuff Size: Normal)   Pulse 91   Ht 5' 7 (1.702 m)   Wt 158 lb 3.2 oz (71.8 kg)   SpO2 95%   BMI 24.78 kg/m    Wt Readings from Last 3 Encounters:  08/26/24 158 lb 3.2 oz (71.8 kg)  08/11/24 157 lb (71.2 kg)  06/11/24 160 lb (72.6 kg)     GEN: Well nourished, well developed female appearing in no acute distress NECK: No JVD; No carotid bruits CARDIAC: RRR, no murmurs, rubs, gallops RESPIRATORY:  Clear to auscultation without rales, wheezing or rhonchi  ABDOMEN: Appears non-distended. No obvious abdominal masses. EXTREMITIES: No clubbing or cyanosis. No pitting edema.  Distal pedal pulses are 2+ bilaterally.   Assessment and Plan:   1. Palpitations - Recent monitor showed predominantly normal sinus rhythm with an average heart rate of 84 bpm  and rare PAC's and PVC's representing less than 1% of total beats. She does report symptoms have improved since the time of her last visit. For now, continue current medical therapy with Labetalol  400 mg twice daily.  2. Essential hypertension - Blood pressure is well-controlled at 130/72 during today's visit but she does report variable readings when checked at home as discussed above. I suspect this is due to her having to cut her medications in half due to pill dysphagia and impacting absorption of them. Encouraged her to keep a BP log and follow symptoms.  For now, continue current medical therapy with Chlorthalidone  25 mg daily, Clonidine  0.2 mg 3 times daily, Hydralazine  50 mg 3 times daily, Labetalol  400 mg twice daily, Losartan  100 mg daily and Minoxidil  2.5 mg daily.  3. Mixed hyperlipidemia - Followed by her PCP. She was previously overall tolerating Crestor  5 mg once weekly well but was off of this for several weeks due to having a respiratory illness. She does plan to resume and will have follow-up labs with her PCP in 3 months.  4. Preoperative Cardiac Clearance for EGD - We have not received a formal clearance request for EGD but she does mention that this is scheduled for later this month. From a cardiac perspective, she does not have any known history of CAD or CHF. She does not require further cardiac testing prior to her procedure and RCRI Risk is low at 1.1% risk of major cardiac events.   Signed, Laymon CHRISTELLA Qua, PA-C   "

## 2024-08-26 ENCOUNTER — Ambulatory Visit: Attending: Student | Admitting: Student

## 2024-08-26 ENCOUNTER — Encounter: Payer: Self-pay | Admitting: Student

## 2024-08-26 VITALS — BP 130/72 | HR 91 | Ht 67.0 in | Wt 158.2 lb

## 2024-08-26 DIAGNOSIS — E782 Mixed hyperlipidemia: Secondary | ICD-10-CM | POA: Diagnosis not present

## 2024-08-26 DIAGNOSIS — Z01810 Encounter for preprocedural cardiovascular examination: Secondary | ICD-10-CM | POA: Diagnosis not present

## 2024-08-26 DIAGNOSIS — R002 Palpitations: Secondary | ICD-10-CM | POA: Diagnosis not present

## 2024-08-26 DIAGNOSIS — I10 Essential (primary) hypertension: Secondary | ICD-10-CM

## 2024-08-26 MED ORDER — LOSARTAN POTASSIUM 100 MG PO TABS: 100.0000 mg | ORAL_TABLET | Freq: Every day | ORAL | 3 refills | Status: AC

## 2024-08-26 NOTE — Patient Instructions (Signed)
 Medication Instructions:   Continue current medication regimen.   *If you need a refill on your cardiac medications before your next appointment, please call your pharmacy*  Follow-Up: At [facility], you and your health needs are our priority.  As part of our continuing mission to provide you with exceptional heart care, our providers are all part of one team.  This team includes your primary Cardiologist (physician) and Advanced Practice Providers or APPs (Physician Assistants and Nurse Practitioners) who all work together to provide you with the care you need, when you need it.  Your next appointment:   6 month(s)  Provider:   You may see Teddie Favre, MD or one of the following Advanced Practice Providers on your designated Care Team:   Woodfin Hays, PA-C  [place], [place] Theotis Flake, [place]     We recommend signing up for the patient portal called "MyChart".  Sign up information is provided on this After Visit Summary.  MyChart is used to connect with patients for Virtual Visits (Telemedicine).  Patients are able to view lab/test results, encounter notes, upcoming appointments, etc.  Non-urgent messages can be sent to your provider as well.   To learn more about what you can do with MyChart, go to [email or web address].

## 2024-08-27 NOTE — Telephone Encounter (Signed)
 I spoke to Amy Cobb and we reviewed her procedure instructions together.  I told her that she can access her letter through MyChart but I will send a copy by mail also.

## 2024-09-03 ENCOUNTER — Encounter (HOSPITAL_BASED_OUTPATIENT_CLINIC_OR_DEPARTMENT_OTHER): Payer: Self-pay | Admitting: Pulmonary Disease

## 2024-09-03 ENCOUNTER — Ambulatory Visit (HOSPITAL_BASED_OUTPATIENT_CLINIC_OR_DEPARTMENT_OTHER): Admitting: Pulmonary Disease

## 2024-09-03 VITALS — BP 118/71 | HR 89 | Ht 67.0 in | Wt 159.8 lb

## 2024-09-03 DIAGNOSIS — J40 Bronchitis, not specified as acute or chronic: Secondary | ICD-10-CM

## 2024-09-03 DIAGNOSIS — Z87891 Personal history of nicotine dependence: Secondary | ICD-10-CM

## 2024-09-03 DIAGNOSIS — J4540 Moderate persistent asthma, uncomplicated: Secondary | ICD-10-CM | POA: Diagnosis not present

## 2024-09-03 NOTE — Progress Notes (Signed)
 "  Subjective:    Patient ID: Amy Cobb, female    DOB: [birth year], 64 y.o.   MRN: [id number]    64 yo ex-smoker  For FU of asthma and mild OSA -unable to tolerate CPAP She smoked about a pack per week starting at age 7 until age 73, less than 10 pack years.  She reports that her breathing problem started when she moved from [place]  to [place]  in 2005.  She reports prednisone  tapers 5-6 times in the year and hospitalized at least once or twice every year.  OV 02/2022 >>continues to have flares of her asthma. -step up therapy from Symbicort  to Trelegy   PMH - hospitalized with Covid pneumonia 03/2020, discharged on 4 L oxygen .    She has a delayed sleep phase syndrome with bedtime as late as 2 AM  -insulin  requiring diabetes, hypertension requiring 6 medications  -dysphagia -C5/T1 impingement   ED visit 09/2021 for chest pain, CT angiogram was negative for pulmonary embolism but showed small pericardial effusion. She was treated with NSAIDs for presumed pericarditis.   Discussed the use of AI scribe software for clinical note transcription with the patient, who gave verbal consent to proceed.  History of Present Illness Amy Cobb is a 64 year old female with asthma, recurring bronchitis, and mild obstructive sleep apnea who presents for follow-up.  She has improved since her last visit. Earlier this year she needed prednisone  10 mg for five days for chest tightness after a prior course of antibiotics and prednisone  for bronchitis. She typically has bronchitis episodes needing antibiotics three to four times per year.  She continues to have swallowing difficulties and is scheduled for an upper endoscopy at the end of the month for further evaluation.  Her breathing is stable on Symbicort  twice daily with mouth rinsing. She uses a breathing exercise device three times daily, reaching 1500 to 2000 in the morning and up to 2500 in the afternoon.  For mild obstructive  sleep apnea, she is not using CPAP despite a prior trial. Her sleep is irregular. She sleeps mostly during the day, which she attributes to clonidine  taken three times a day, and is awake at night with difficulty maintaining a regular schedule.     Significant tests/ events reviewed  05/2024 CT chest BLL bronchiolitis  ? Infectious >> omnicef   01/2024 swallow eval & esophagram 05/2024 shows C5/T1 impingement on esophagus     04/2021 lexiscan  myoview  >> low risk study   PFTs 10/2020 no airway obstruction, nml FEV1, DLCO 74%   09/2020 ONO/RA shows minimal desaturation for 7 minutes less than 88%.>> dc O2   10/2022 HST  very mild OSA with AHI 6/ hr   04/2018 NPSG -mild OSA, AHI 9/hour, RDI 30/hour 05/2018 CPAP titration >> 10 cm , medium mask, wt 174 lbs  Review of Systems  neg for any significant sore throat, dysphagia, itching, sneezing, nasal congestion or excess/ purulent secretions, fever, chills, sweats, unintended wt loss, pleuritic or exertional cp, hempoptysis, orthopnea pnd or change in chronic leg swelling. Also denies presyncope, palpitations, heartburn, abdominal pain, nausea, vomiting, diarrhea or change in bowel or urinary habits, dysuria,hematuria, rash, arthralgias, visual complaints, headache, numbness weakness or ataxia.      Objective:   Physical Exam  Gen. Pleasant, well-nourished, in no distress ENT - no thrush, no pallor/icterus,no post nasal drip Neck: No JVD, no thyromegaly, no carotid bruits Lungs: no use of accessory muscles, no dullness to percussion, clear without rales  or rhonchi  Cardiovascular: Rhythm regular, heart sounds  normal, no murmurs or gallops, no peripheral edema Musculoskeletal: No deformities, no cyanosis or clubbing        Assessment & Plan:   Assessment and Plan Assessment & Plan Moderate persistent asthma Asthma is managed with Symbicort  twice daily. She rinses her mouth after use. Peak flow measurements are 1500-2000 in the morning  and 2500 in the afternoon, indicating good control. - Continue Symbicort  twice daily - Ensure mouth rinsing after Symbicort  use  Recurrent bronchitis Requires antibiotics three to four times a year. No etiology identified. Dysphagia due to C5T1 impingement, but no aspiration episodes. Upper endoscopy planned to evaluate swallowing issues. - Proceed with scheduled upper endoscopy     "

## 2024-09-03 NOTE — Patient Instructions (Signed)
" °  VISIT SUMMARY: During today's visit, we discussed your asthma, recurring bronchitis, and mild obstructive sleep apnea. You have shown improvement since your last visit, and we reviewed your current medications and upcoming procedures.  YOUR PLAN: -MODERATE PERSISTENT ASTHMA: Asthma is a condition where your airways narrow and swell, making it difficult to breathe. Your asthma is currently well-managed with Symbicort , which you should continue using twice daily. Remember to rinse your mouth after each use to prevent any side effects. Your peak flow measurements indicate good control of your asthma.  -RECURRENT BRONCHITIS: Bronchitis is an inflammation of the lining of your bronchial tubes, which carry air to and from your lungs. You typically need antibiotics three to four times a year for bronchitis. We have not identified a specific cause for your recurrent bronchitis. You have swallowing difficulties due to an issue in your neck, but there have been no episodes of food or liquid going into your lungs. You are scheduled for an upper endoscopy at the end of the month to further evaluate your swallowing issues.  INSTRUCTIONS: Please continue using Symbicort  twice daily and rinse your mouth after each use. Attend your scheduled upper endoscopy at the end of the month to evaluate your swallowing difficulties.                      Contains text generated by [employer].                                 Contains text generated by [employer].   "

## 2024-09-09 ENCOUNTER — Encounter: Payer: Self-pay | Admitting: Gastroenterology

## 2024-09-16 ENCOUNTER — Encounter: Admitting: Gastroenterology

## 2024-09-16 ENCOUNTER — Telehealth: Payer: Self-pay | Admitting: Gastroenterology

## 2024-09-16 NOTE — Telephone Encounter (Signed)
 Okay got it, thanks for letting me know

## 2024-09-16 NOTE — Telephone Encounter (Signed)
 Good Morning Dr.Armbruster,  Patient calling needing to cancel EGD scheduled for today due to weather and will call back to reschedule once she has her care partners calendar   Please advise  Thank you

## 2025-01-06 ENCOUNTER — Ambulatory Visit: Admitting: Podiatry
